# Patient Record
Sex: Female | Born: 1945 | Race: White | Hispanic: No | Marital: Married | Smoking: Never smoker
Health system: Southern US, Community
[De-identification: ages and names within clinical notes are randomized; demographics above are authoritative.]

## PROBLEM LIST (undated history)

## (undated) DIAGNOSIS — K219 Gastro-esophageal reflux disease without esophagitis: Secondary | ICD-10-CM

## (undated) DIAGNOSIS — K760 Fatty (change of) liver, not elsewhere classified: Secondary | ICD-10-CM

## (undated) DIAGNOSIS — G43909 Migraine, unspecified, not intractable, without status migrainosus: Secondary | ICD-10-CM

## (undated) DIAGNOSIS — E785 Hyperlipidemia, unspecified: Secondary | ICD-10-CM

## (undated) DIAGNOSIS — I10 Essential (primary) hypertension: Secondary | ICD-10-CM

## (undated) DIAGNOSIS — G4733 Obstructive sleep apnea (adult) (pediatric): Secondary | ICD-10-CM

## (undated) DIAGNOSIS — I509 Heart failure, unspecified: Secondary | ICD-10-CM

## (undated) DIAGNOSIS — F329 Major depressive disorder, single episode, unspecified: Secondary | ICD-10-CM

## (undated) DIAGNOSIS — G8929 Other chronic pain: Secondary | ICD-10-CM

## (undated) DIAGNOSIS — F32A Depression, unspecified: Secondary | ICD-10-CM

## (undated) DIAGNOSIS — E119 Type 2 diabetes mellitus without complications: Secondary | ICD-10-CM

## (undated) DIAGNOSIS — C9210 Chronic myeloid leukemia, BCR/ABL-positive, not having achieved remission: Secondary | ICD-10-CM

## (undated) DIAGNOSIS — L219 Seborrheic dermatitis, unspecified: Secondary | ICD-10-CM

## (undated) DIAGNOSIS — M109 Gout, unspecified: Secondary | ICD-10-CM

## (undated) DIAGNOSIS — G2581 Restless legs syndrome: Secondary | ICD-10-CM

## (undated) DIAGNOSIS — G629 Polyneuropathy, unspecified: Secondary | ICD-10-CM

## (undated) DIAGNOSIS — M549 Dorsalgia, unspecified: Secondary | ICD-10-CM

## (undated) DIAGNOSIS — L719 Rosacea, unspecified: Secondary | ICD-10-CM

## (undated) DIAGNOSIS — L309 Dermatitis, unspecified: Secondary | ICD-10-CM

## (undated) DIAGNOSIS — M1990 Unspecified osteoarthritis, unspecified site: Secondary | ICD-10-CM

## (undated) HISTORY — PX: ABDOMINAL HYSTERECTOMY: SHX81

## (undated) HISTORY — DX: Heart failure, unspecified: I50.9

## (undated) HISTORY — PX: JOINT REPLACEMENT: SHX530

## (undated) HISTORY — PX: BACK SURGERY: SHX140

---

## 2013-11-24 DIAGNOSIS — C9210 Chronic myeloid leukemia, BCR/ABL-positive, not having achieved remission: Secondary | ICD-10-CM

## 2013-11-24 HISTORY — DX: Chronic myeloid leukemia, BCR/ABL-positive, not having achieved remission: C92.10

## 2017-11-15 ENCOUNTER — Inpatient Hospital Stay (HOSPITAL_BASED_OUTPATIENT_CLINIC_OR_DEPARTMENT_OTHER)
Admission: EM | Admit: 2017-11-15 | Discharge: 2017-11-21 | DRG: 871 | Disposition: A | Discharge status: Home / Self Care | Payer: Medicare HMO | Attending: Pulmonary Disease | Admitting: Pulmonary Disease

## 2017-11-15 ENCOUNTER — Encounter (HOSPITAL_BASED_OUTPATIENT_CLINIC_OR_DEPARTMENT_OTHER): Payer: Self-pay | Admitting: Emergency Medicine

## 2017-11-15 ENCOUNTER — Emergency Department (HOSPITAL_BASED_OUTPATIENT_CLINIC_OR_DEPARTMENT_OTHER): Payer: Medicare HMO

## 2017-11-15 ENCOUNTER — Other Ambulatory Visit: Payer: Self-pay

## 2017-11-15 DIAGNOSIS — G8929 Other chronic pain: Secondary | ICD-10-CM | POA: Diagnosis present

## 2017-11-15 DIAGNOSIS — A419 Sepsis, unspecified organism: Secondary | ICD-10-CM | POA: Diagnosis present

## 2017-11-15 DIAGNOSIS — J189 Pneumonia, unspecified organism: Secondary | ICD-10-CM | POA: Diagnosis present

## 2017-11-15 DIAGNOSIS — R0902 Hypoxemia: Secondary | ICD-10-CM | POA: Diagnosis not present

## 2017-11-15 DIAGNOSIS — E1142 Type 2 diabetes mellitus with diabetic polyneuropathy: Secondary | ICD-10-CM | POA: Diagnosis present

## 2017-11-15 DIAGNOSIS — C9211 Chronic myeloid leukemia, BCR/ABL-positive, in remission: Secondary | ICD-10-CM | POA: Diagnosis present

## 2017-11-15 DIAGNOSIS — Z885 Allergy status to narcotic agent status: Secondary | ICD-10-CM | POA: Diagnosis not present

## 2017-11-15 DIAGNOSIS — Z91040 Latex allergy status: Secondary | ICD-10-CM

## 2017-11-15 DIAGNOSIS — K219 Gastro-esophageal reflux disease without esophagitis: Secondary | ICD-10-CM | POA: Diagnosis present

## 2017-11-15 DIAGNOSIS — T380X5A Adverse effect of glucocorticoids and synthetic analogues, initial encounter: Secondary | ICD-10-CM | POA: Diagnosis not present

## 2017-11-15 DIAGNOSIS — G2581 Restless legs syndrome: Secondary | ICD-10-CM | POA: Diagnosis present

## 2017-11-15 DIAGNOSIS — C9210 Chronic myeloid leukemia, BCR/ABL-positive, not having achieved remission: Secondary | ICD-10-CM

## 2017-11-15 DIAGNOSIS — E876 Hypokalemia: Secondary | ICD-10-CM | POA: Diagnosis not present

## 2017-11-15 DIAGNOSIS — J1289 Other viral pneumonia: Secondary | ICD-10-CM | POA: Diagnosis not present

## 2017-11-15 DIAGNOSIS — I10 Essential (primary) hypertension: Secondary | ICD-10-CM | POA: Diagnosis not present

## 2017-11-15 DIAGNOSIS — E785 Hyperlipidemia, unspecified: Secondary | ICD-10-CM | POA: Diagnosis present

## 2017-11-15 DIAGNOSIS — M549 Dorsalgia, unspecified: Secondary | ICD-10-CM | POA: Diagnosis present

## 2017-11-15 DIAGNOSIS — E872 Acidosis: Secondary | ICD-10-CM | POA: Diagnosis present

## 2017-11-15 DIAGNOSIS — K769 Liver disease, unspecified: Secondary | ICD-10-CM | POA: Diagnosis present

## 2017-11-15 DIAGNOSIS — Z888 Allergy status to other drugs, medicaments and biological substances status: Secondary | ICD-10-CM | POA: Diagnosis not present

## 2017-11-15 DIAGNOSIS — J129 Viral pneumonia, unspecified: Secondary | ICD-10-CM | POA: Diagnosis not present

## 2017-11-15 DIAGNOSIS — G4733 Obstructive sleep apnea (adult) (pediatric): Secondary | ICD-10-CM | POA: Diagnosis present

## 2017-11-15 DIAGNOSIS — F419 Anxiety disorder, unspecified: Secondary | ICD-10-CM | POA: Diagnosis present

## 2017-11-15 DIAGNOSIS — Z886 Allergy status to analgesic agent status: Secondary | ICD-10-CM

## 2017-11-15 DIAGNOSIS — J9601 Acute respiratory failure with hypoxia: Secondary | ICD-10-CM | POA: Diagnosis not present

## 2017-11-15 DIAGNOSIS — J9690 Respiratory failure, unspecified, unspecified whether with hypoxia or hypercapnia: Secondary | ICD-10-CM

## 2017-11-15 DIAGNOSIS — Z79899 Other long term (current) drug therapy: Secondary | ICD-10-CM

## 2017-11-15 DIAGNOSIS — Y95 Nosocomial condition: Secondary | ICD-10-CM | POA: Diagnosis present

## 2017-11-15 DIAGNOSIS — Z7984 Long term (current) use of oral hypoglycemic drugs: Secondary | ICD-10-CM | POA: Diagnosis not present

## 2017-11-15 DIAGNOSIS — E1165 Type 2 diabetes mellitus with hyperglycemia: Secondary | ICD-10-CM | POA: Diagnosis present

## 2017-11-15 DIAGNOSIS — F329 Major depressive disorder, single episode, unspecified: Secondary | ICD-10-CM | POA: Diagnosis present

## 2017-11-15 HISTORY — DX: Polyneuropathy, unspecified: G62.9

## 2017-11-15 HISTORY — DX: Gout, unspecified: M10.9

## 2017-11-15 HISTORY — DX: Essential (primary) hypertension: I10

## 2017-11-15 HISTORY — DX: Major depressive disorder, single episode, unspecified: F32.9

## 2017-11-15 HISTORY — DX: Dermatitis, unspecified: L30.9

## 2017-11-15 HISTORY — DX: Restless legs syndrome: G25.81

## 2017-11-15 HISTORY — DX: Dorsalgia, unspecified: M54.9

## 2017-11-15 HISTORY — DX: Type 2 diabetes mellitus without complications: E11.9

## 2017-11-15 HISTORY — DX: Chronic myeloid leukemia, BCR/ABL-positive, not having achieved remission: C92.10

## 2017-11-15 HISTORY — DX: Hyperlipidemia, unspecified: E78.5

## 2017-11-15 HISTORY — DX: Fatty (change of) liver, not elsewhere classified: K76.0

## 2017-11-15 HISTORY — DX: Rosacea, unspecified: L71.9

## 2017-11-15 HISTORY — DX: Other chronic pain: G89.29

## 2017-11-15 HISTORY — DX: Obstructive sleep apnea (adult) (pediatric): G47.33

## 2017-11-15 HISTORY — DX: Depression, unspecified: F32.A

## 2017-11-15 HISTORY — DX: Migraine, unspecified, not intractable, without status migrainosus: G43.909

## 2017-11-15 HISTORY — DX: Seborrheic dermatitis, unspecified: L21.9

## 2017-11-15 HISTORY — DX: Gastro-esophageal reflux disease without esophagitis: K21.9

## 2017-11-15 LAB — PROTIME-INR
INR: 1.13
Prothrombin Time: 14.4 seconds (ref 11.4–15.2)

## 2017-11-15 LAB — COMPREHENSIVE METABOLIC PANEL
ALBUMIN: 3.5 g/dL (ref 3.5–5.0)
ALK PHOS: 115 U/L (ref 38–126)
ALT: 20 U/L (ref 14–54)
ANION GAP: 11 (ref 5–15)
AST: 36 U/L (ref 15–41)
BUN: 12 mg/dL (ref 6–20)
CALCIUM: 8.8 mg/dL — AB (ref 8.9–10.3)
CHLORIDE: 102 mmol/L (ref 101–111)
CO2: 24 mmol/L (ref 22–32)
Creatinine, Ser: 0.9 mg/dL (ref 0.44–1.00)
GFR calc non Af Amer: >60 mL/min (ref >60)
GLUCOSE: 128 mg/dL — AB (ref 65–99)
Potassium: 3.4 mmol/L — ABNORMAL LOW (ref 3.5–5.1)
SODIUM: 137 mmol/L (ref 135–145)
Total Bilirubin: 1.7 mg/dL — ABNORMAL HIGH (ref 0.3–1.2)
Total Protein: 7.6 g/dL (ref 6.5–8.1)

## 2017-11-15 LAB — CBC WITH DIFFERENTIAL/PLATELET
Basophils Absolute: 0.1 10*3/uL (ref 0.0–0.1)
Basophils Relative: 0 %
Eosinophils Absolute: 0.1 10*3/uL (ref 0.0–0.7)
Eosinophils Relative: 0 %
HEMATOCRIT: 36.5 % (ref 36.0–46.0)
HEMOGLOBIN: 11.8 g/dL — AB (ref 12.0–15.0)
LYMPHS ABS: 1.3 10*3/uL (ref 0.7–4.0)
LYMPHS PCT: 7 %
MCH: 27.6 pg (ref 26.0–34.0)
MCHC: 32.3 g/dL (ref 30.0–36.0)
MCV: 85.3 fL (ref 78.0–100.0)
MONOS PCT: 8 %
Monocytes Absolute: 1.6 10*3/uL — ABNORMAL HIGH (ref 0.1–1.0)
NEUTROS PCT: 85 %
Neutro Abs: 15.8 10*3/uL (ref 1.7–7.7)
Platelets: 332 10*3/uL (ref 150–400)
RBC: 4.28 MIL/uL (ref 3.87–5.11)
RDW: 15.7 % — ABNORMAL HIGH (ref 11.5–15.5)
WBC: 18.8 10*3/uL — AB (ref 4.0–10.5)

## 2017-11-15 LAB — I-STAT CG4 LACTIC ACID, ED: Lactic Acid, Venous: 1.63 mmol/L (ref 0.5–1.9)

## 2017-11-15 LAB — GLUCOSE, CAPILLARY: Glucose-Capillary: 110 mg/dL — ABNORMAL HIGH (ref 65–99)

## 2017-11-15 MED ORDER — ACETAMINOPHEN 500 MG PO TABS: 1000.0000 mg | ORAL_TABLET | Freq: Once | ORAL | Status: DC

## 2017-11-15 MED ORDER — DULOXETINE HCL 60 MG PO CPEP
60.0000 mg | ORAL_CAPSULE | Freq: Every day | ORAL | Status: DC
  Administered 2017-11-18 – 2017-11-21 (×4): 60 mg via ORAL
  Filled 2017-11-15: qty 1
  Filled 2017-11-15 (×3): qty 2

## 2017-11-15 MED ORDER — CEFEPIME HCL 2 G IJ SOLR
INTRAMUSCULAR | Status: AC
  Filled 2017-11-15: qty 2

## 2017-11-15 MED ORDER — IPRATROPIUM-ALBUTEROL 0.5-2.5 (3) MG/3ML IN SOLN
3.0000 mL | Freq: Once | RESPIRATORY_TRACT | Status: AC
  Administered 2017-11-15: 3 mL via RESPIRATORY_TRACT
  Filled 2017-11-15: qty 3

## 2017-11-15 MED ORDER — MECLIZINE HCL 25 MG PO TABS
12.5000 mg | ORAL_TABLET | Freq: Three times a day (TID) | ORAL | Status: DC | PRN
  Filled 2017-11-15: qty 1

## 2017-11-15 MED ORDER — VANCOMYCIN HCL IN DEXTROSE 750-5 MG/150ML-% IV SOLN
750.0000 mg | Freq: Two times a day (BID) | INTRAVENOUS | Status: DC
  Administered 2017-11-16 – 2017-11-18 (×5): 750 mg via INTRAVENOUS
  Filled 2017-11-15 (×6): qty 150

## 2017-11-15 MED ORDER — ROPINIROLE HCL 1 MG PO TABS
4.0000 mg | ORAL_TABLET | Freq: Every day | ORAL | Status: DC
  Administered 2017-11-15 – 2017-11-20 (×5): 4 mg via ORAL
  Filled 2017-11-15 (×6): qty 4

## 2017-11-15 MED ORDER — GUAIFENESIN ER 600 MG PO TB12
600.0000 mg | ORAL_TABLET | Freq: Two times a day (BID) | ORAL | Status: DC
  Administered 2017-11-15 – 2017-11-21 (×9): 600 mg via ORAL
  Filled 2017-11-15 (×9): qty 1

## 2017-11-15 MED ORDER — ENOXAPARIN SODIUM 40 MG/0.4ML ~~LOC~~ SOLN
40.0000 mg | Freq: Every day | SUBCUTANEOUS | Status: DC
  Administered 2017-11-15 – 2017-11-20 (×6): 40 mg via SUBCUTANEOUS
  Filled 2017-11-15 (×6): qty 0.4

## 2017-11-15 MED ORDER — POTASSIUM CHLORIDE CRYS ER 20 MEQ PO TBCR
20.0000 meq | EXTENDED_RELEASE_TABLET | Freq: Two times a day (BID) | ORAL | Status: DC
  Administered 2017-11-15 – 2017-11-16 (×3): 20 meq via ORAL
  Filled 2017-11-15 (×3): qty 1

## 2017-11-15 MED ORDER — ALLOPURINOL 300 MG PO TABS
300.0000 mg | ORAL_TABLET | Freq: Every day | ORAL | Status: DC
  Administered 2017-11-18 – 2017-11-21 (×4): 300 mg via ORAL
  Filled 2017-11-15 (×2): qty 3
  Filled 2017-11-15: qty 1
  Filled 2017-11-15: qty 3

## 2017-11-15 MED ORDER — ALBUTEROL SULFATE (2.5 MG/3ML) 0.083% IN NEBU
2.5000 mg | INHALATION_SOLUTION | RESPIRATORY_TRACT | Status: DC | PRN
Start: 2017-11-15 — End: 2017-11-16
  Administered 2017-11-15: 2.5 mg via RESPIRATORY_TRACT
  Filled 2017-11-15: qty 3

## 2017-11-15 MED ORDER — FUROSEMIDE 20 MG PO TABS: 20.0000 mg | ORAL_TABLET | Freq: Every day | ORAL | Status: DC

## 2017-11-15 MED ORDER — LORAZEPAM 1 MG PO TABS
1.0000 mg | ORAL_TABLET | Freq: Two times a day (BID) | ORAL | Status: DC | PRN
  Administered 2017-11-16 – 2017-11-19 (×2): 1 mg via ORAL
  Filled 2017-11-15 (×3): qty 1

## 2017-11-15 MED ORDER — INSULIN ASPART 100 UNIT/ML ~~LOC~~ SOLN
0.0000 [IU] | Freq: Three times a day (TID) | SUBCUTANEOUS | Status: DC
  Administered 2017-11-16 – 2017-11-19 (×3): 2 [IU] via SUBCUTANEOUS
  Administered 2017-11-19: 3 [IU] via SUBCUTANEOUS
  Administered 2017-11-19 – 2017-11-20 (×2): 2 [IU] via SUBCUTANEOUS

## 2017-11-15 MED ORDER — PANTOPRAZOLE SODIUM 40 MG PO TBEC
40.0000 mg | DELAYED_RELEASE_TABLET | Freq: Every day | ORAL | Status: DC
Start: 2017-11-16 — End: 2017-11-21
  Administered 2017-11-18 – 2017-11-20 (×3): 40 mg via ORAL
  Filled 2017-11-15 (×3): qty 1

## 2017-11-15 MED ORDER — ACETAMINOPHEN 650 MG RE SUPP
RECTAL | Status: AC
  Filled 2017-11-15: qty 1

## 2017-11-15 MED ORDER — ACETAMINOPHEN 325 MG PO TABS
650.0000 mg | ORAL_TABLET | Freq: Four times a day (QID) | ORAL | Status: DC | PRN
  Administered 2017-11-15 – 2017-11-16 (×2): 650 mg via ORAL
  Filled 2017-11-15 (×2): qty 2

## 2017-11-15 MED ORDER — CEFEPIME HCL 2 G IJ SOLR
2.0000 g | Freq: Once | INTRAMUSCULAR | Status: AC
  Administered 2017-11-15: 2 g via INTRAVENOUS

## 2017-11-15 MED ORDER — SODIUM CHLORIDE 0.9 % IV SOLN
INTRAVENOUS | Status: AC
  Administered 2017-11-15: 23:00:00 via INTRAVENOUS

## 2017-11-15 MED ORDER — IPRATROPIUM-ALBUTEROL 0.5-2.5 (3) MG/3ML IN SOLN
3.0000 mL | RESPIRATORY_TRACT | Status: DC
  Administered 2017-11-16 (×2): 3 mL via RESPIRATORY_TRACT
  Filled 2017-11-15 (×2): qty 3

## 2017-11-15 MED ORDER — SODIUM CHLORIDE 0.9 % IV BOLUS (SEPSIS)
1000.0000 mL | Freq: Once | INTRAVENOUS | Status: AC
  Administered 2017-11-15: 1000 mL via INTRAVENOUS

## 2017-11-15 MED ORDER — VANCOMYCIN HCL IN DEXTROSE 1-5 GM/200ML-% IV SOLN
1000.0000 mg | Freq: Once | INTRAVENOUS | Status: AC
  Administered 2017-11-15: 1000 mg via INTRAVENOUS
  Filled 2017-11-15: qty 200

## 2017-11-15 MED ORDER — SPIRONOLACTONE 25 MG PO TABS
25.0000 mg | ORAL_TABLET | Freq: Every day | ORAL | Status: DC
  Administered 2017-11-18 – 2017-11-21 (×4): 25 mg via ORAL
  Filled 2017-11-15 (×4): qty 1

## 2017-11-15 MED ORDER — DEXTROSE 5 % IV SOLN
1.0000 g | Freq: Three times a day (TID) | INTRAVENOUS | Status: DC
  Administered 2017-11-16 – 2017-11-21 (×16): 1 g via INTRAVENOUS
  Filled 2017-11-15 (×20): qty 1

## 2017-11-15 MED ORDER — FLUTICASONE PROPIONATE 50 MCG/ACT NA SUSP
1.0000 | Freq: Every day | NASAL | Status: DC
  Administered 2017-11-19 – 2017-11-21 (×3): 1 via NASAL
  Filled 2017-11-15: qty 16

## 2017-11-15 MED ORDER — IPRATROPIUM-ALBUTEROL 0.5-2.5 (3) MG/3ML IN SOLN: 3.0000 mL | Freq: Four times a day (QID) | RESPIRATORY_TRACT | Status: DC

## 2017-11-15 MED ORDER — PRAVASTATIN SODIUM 20 MG PO TABS
10.0000 mg | ORAL_TABLET | Freq: Every day | ORAL | Status: DC
  Administered 2017-11-18 – 2017-11-21 (×4): 10 mg via ORAL
  Filled 2017-11-15 (×4): qty 1

## 2017-11-15 MED ORDER — NILOTINIB HCL 200 MG PO CAPS: 200.0000 mg | ORAL_CAPSULE | Freq: Two times a day (BID) | ORAL | Status: DC

## 2017-11-15 MED ORDER — AMLODIPINE BESYLATE 5 MG PO TABS
5.0000 mg | ORAL_TABLET | Freq: Two times a day (BID) | ORAL | Status: DC
  Administered 2017-11-15 – 2017-11-21 (×9): 5 mg via ORAL
  Filled 2017-11-15 (×9): qty 1

## 2017-11-15 MED ORDER — ACETAMINOPHEN 650 MG RE SUPP
650.0000 mg | Freq: Once | RECTAL | Status: AC
  Administered 2017-11-15: 650 mg via RECTAL

## 2017-11-15 NOTE — ED Notes (Signed)
Pt SpO2 88% on RA, Pt placed on 2L [place]

## 2017-11-15 NOTE — ED Notes (Signed)
[facility] arrived  

## 2017-11-15 NOTE — ED Triage Notes (Signed)
Patient states that she was at the urgent care for a cough and was dx with Pneumonia. The patient reports that she was sent to the ER for treatment

## 2017-11-15 NOTE — Progress Notes (Signed)
MEDICATION RELATED CONSULT NOTE - INITIAL   Pharmacy Consult for Nilotinib Indication: Oral chemo for CML  Allergies  Allergen Reactions  . Asa [Aspirin]     Ulcer   . Latex   . Morphine And Related     SOB   . Tylenol [Acetaminophen]     Ulcer     Patient Measurements: Height: 5' (152.4 cm) Weight: 203 lb (92.1 kg) IBW/kg (Calculated) : 45.5 Adjusted Body Weight:   Vital Signs: Temp: 102.3 F (39.1 C) (12/23 2100) Temp Source: Axillary (12/23 2100) BP: 129/63 (12/23 2100) Pulse Rate: 102 (12/23 2100) Intake/Output from previous day: No intake/output data recorded. Intake/Output from this shift: Total I/O In: 200 [IV Piggyback:200] Out: -   Labs: Recent Labs    11/15/17 1750  WBC 18.8*  HGB 11.8*  HCT 36.5  PLT 332  CREATININE 0.90  ALBUMIN 3.5  PROT 7.6  AST 36  ALT 20  ALKPHOS 115  BILITOT 1.7*   Estimated Creatinine Clearance: 59.7 mL/min (by C-G formula based on SCr of 0.9 mg/dL).   Microbiology: No results found for this or any previous visit (from the past 720 hour(s)).  Medical History: Past Medical History:  Diagnosis Date  . 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 Liver disease     Medications:  Medications Prior to Admission  Medication Sig Dispense Refill Last Dose  . allopurinol (ZYLOPRIM) 300 MG tablet Take 300 mg by mouth daily.   11/14/2017 at Unknown time  . amLODipine (NORVASC) 5 MG tablet Take 5 mg by mouth 2 (two) times daily.   11/14/2017 at Unknown time  . azithromycin (ZITHROMAX) 250 MG tablet Take 250-500 mg by mouth daily. 500 mg on day one, 250 mg daily days 2-5   11/14/2017 at Unknown time  . diclofenac sodium (VOLTAREN) 1 % GEL Apply 1 application topically daily as needed for pain. Right great toe   Past Week at Unknown time  . DULoxetine (CYMBALTA) 60 MG capsule Take 60 mg by mouth daily.   11/14/2017 at Unknown time  . fluticasone (FLONASE) 50 MCG/ACT nasal spray Place 1 spray into both nostrils daily.    11/15/2017 at Unknown time  . furosemide (LASIX) 20 MG tablet Take 20 mg by mouth.   11/14/2017 at Unknown time  . glipiZIDE (GLUCOTROL) 5 MG tablet Take 5 mg by mouth daily before breakfast.   11/14/2017 at Unknown time  . iron polysaccharides (NIFEREX) 150 MG capsule Take 150 mg by mouth daily.   11/14/2017 at Unknown time  . LORazepam (ATIVAN) 0.5 MG tablet Take 1 mg by mouth 3 times/day as needed-between meals & bedtime for sleep.    11/14/2017 at Unknown time  . meclizine (ANTIVERT) 12.5 MG tablet Take 12.5 mg by mouth 3 (three) times daily as needed for dizziness.   Past Month at Unknown time  . Multiple Vitamins-Minerals (MULTIVITAMIN WITH MINERALS) tablet Take 1 tablet by mouth daily.   11/14/2017 at Unknown time  . nilotinib (TASIGNA) 200 MG capsule Take 200 mg by mouth every 12 (twelve) hours. Give on an empty stomach 1 hour before or 2 hours after meals.   11/14/2017 at Unknown time  . omeprazole (PRILOSEC) 40 MG capsule Take 40 mg by mouth daily.   11/14/2017 at Unknown time  . potassium chloride (KLOR-CON) 20 MEQ packet Take by mouth 2 (two) times daily.   11/14/2017 at Unknown time  . pravastatin (PRAVACHOL) 10 MG tablet Take 10 mg by mouth daily.   11/14/2017 at  Unknown time  . pregabalin (LYRICA) 50 MG capsule Take 100 mg by mouth at bedtime.    11/14/2017 at Unknown time  . rOPINIRole (REQUIP) 1 MG tablet Take 4 mg by mouth at bedtime.    11/14/2017 at Unknown time  . spironolactone (ALDACTONE) 25 MG tablet Take 25 mg by mouth daily.   11/14/2017 at Unknown time  . tiZANidine (ZANAFLEX) 4 MG capsule Take 4 mg by mouth at bedtime.    11/14/2017 at Unknown time  . traMADol (ULTRAM) 50 MG tablet Take by mouth every 6 (six) hours as needed.   Past Week at Unknown time    Assessment: Patient with Nilotinib for CML.  Patient also with active infection  Nilotinib ([place]) hold criteria  Persistent hypokalemia or hypomagnesemia  Unexplained pancreatitis  QTc > 500 msec (0.5 sec)  [need recent EKG]  Active infection   Goal of Therapy:  Safe and effective use of Nilotinib  Plan:  D/c Nilotinib at this time  Nani Skillern Crowford 11/15/2017,11:37 PM

## 2017-11-15 NOTE — H&P (Signed)
[facility] History and Physical  Wanda Hanson MGQ:[id number] DOB: 12-31-1945 DOA: 11/15/2017  Referring physician:  PCP: System, Pcp Not In   Chief Complaint: "Been sick since last Thursday."  HPI: Wanda Hanson is a 71 y.o. female with past medical history significant for CML and liver disease presents to the hospital with cough and fever.  Patient has been feeling ill since last Thursday.  2 other family members have bronchitis.  Patient was given azithromycin, completed.  She did not improve.  Went to urgent care today for evaluation.  Diagnosed with pneumonia.  Sent to the emergency room for evaluation.  Course: Patient given Tylenol in the ED for fever.  Patient tolerated this medication.  Chest x-ray showed multifocal pneumonia.  Patient started on IV antibiotics.  Cultures drawn.  Hospitalist consulted for admission.  Review of Systems:  As per HPI otherwise 10 point review of systems negative.    Past Medical History:  Diagnosis Date  . 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 Liver disease    Past Surgical History:  Procedure Laterality Date  . ABDOMINAL HYSTERECTOMY    . BACK SURGERY    . JOINT REPLACEMENT     Social History:  reports that  has never smoked. she has never used smokeless tobacco. She reports that she does not drink alcohol or use drugs.  Allergies  Allergen Reactions  . Asa [Aspirin]     Ulcer   . Latex   . Morphine And Related     SOB   . Tylenol [Acetaminophen]     Ulcer     History reviewed. No pertinent family history.   Prior to Admission medications   Medication Sig Start Date End Date Taking? Authorizing Provider  allopurinol (ZYLOPRIM) 300 MG tablet Take 300 mg by mouth daily.   Yes [provider]  amLODipine (NORVASC) 5 MG tablet Take 5 mg by mouth 2 (two) times daily.   Yes [provider]  azithromycin (ZITHROMAX) 250 MG tablet Take 250-500 mg by mouth daily. 500 mg on day one, 250 mg daily days  2-5 11/12/17  Yes [provider]  diclofenac sodium (VOLTAREN) 1 % GEL Apply 1 application topically daily as needed for pain. Right great toe 04/08/17  Yes [provider]  DULoxetine (CYMBALTA) 60 MG capsule Take 60 mg by mouth daily.   Yes [provider]  fluticasone (FLONASE) 50 MCG/ACT nasal spray Place 1 spray into both nostrils daily. 07/06/17  Yes [provider]  furosemide (LASIX) 20 MG tablet Take 20 mg by mouth.   Yes [provider]  glipiZIDE (GLUCOTROL) 5 MG tablet Take 5 mg by mouth daily before breakfast.   Yes [provider]  iron polysaccharides (NIFEREX) 150 MG capsule Take 150 mg by mouth daily.   Yes [provider]  LORazepam (ATIVAN) 0.5 MG tablet Take 1 mg by mouth 3 times/day as needed-between meals & bedtime for sleep.    Yes [provider]  meclizine (ANTIVERT) 12.5 MG tablet Take 12.5 mg by mouth 3 (three) times daily as needed for dizziness.   Yes [provider]  Multiple Vitamins-Minerals (MULTIVITAMIN WITH MINERALS) tablet Take 1 tablet by mouth daily. 04/05/16  Yes [provider]  nilotinib (TASIGNA) 200 MG capsule Take 200 mg by mouth every 12 (twelve) hours. Give on an empty stomach 1 hour before or 2 hours after meals.   Yes [provider]  omeprazole (PRILOSEC) 40 MG capsule Take 40 mg by mouth  daily.   Yes [provider]  potassium chloride (KLOR-CON) 20 MEQ packet Take by mouth 2 (two) times daily.   Yes [provider]  pravastatin (PRAVACHOL) 10 MG tablet Take 10 mg by mouth daily.   Yes [provider]  pregabalin (LYRICA) 50 MG capsule Take 100 mg by mouth at bedtime.    Yes [provider]  rOPINIRole (REQUIP) 1 MG tablet Take 4 mg by mouth at bedtime.    Yes [provider]  spironolactone (ALDACTONE) 25 MG tablet Take 25 mg by mouth daily. 10/27/17 11/26/17 Yes [provider]  tiZANidine (ZANAFLEX) 4  MG capsule Take 4 mg by mouth at bedtime.    Yes [provider]  traMADol (ULTRAM) 50 MG tablet Take by mouth every 6 (six) hours as needed.   Yes [provider]   Physical Exam: Vitals:   11/15/17 1930 11/15/17 1935 11/15/17 2006 11/15/17 2100  BP: (!) 106/59  108/61 129/63  Pulse: 97  97 (!) 102  Resp: (!) 35  (!) 35 (!) 36  Temp:    (!) 102.3 F (39.1 C)  TempSrc:    Axillary  SpO2: 94% 93% 94% 95%  Weight:      Height:        Wt Readings from Last 3 Encounters:  11/15/17 92.1 kg (203 lb)    General:  Appears calm and comfortable; A&Ox3 Eyes:  PERRL, EOMI, normal lids, iris ENT:  grossly normal hearing, lips & tongue Neck:  no LAD, masses or thyromegaly Cardiovascular:  RRR, no m/r/g. No LE edema.  Respiratory:  Decr air movement, no w/r/r. Normal respiratory effort. Venti mask. Abdomen:  soft, ntnd Skin:  no rash or induration seen on limited exam Musculoskeletal:  grossly normal tone BUE/BLE Psychiatric:  grossly normal mood and affect, speech fluent and appropriate Neurologic:  CN 2-12 grossly intact, moves all extremities in coordinated fashion.          Labs on Admission:  Basic Metabolic Panel: Recent Labs  Lab 11/15/17 1750  NA 137  K 3.4*  CL 102  CO2 24  GLUCOSE 128*  BUN 12  CREATININE 0.90  CALCIUM 8.8*   Liver Function Tests: Recent Labs  Lab 11/15/17 1750  AST 36  ALT 20  ALKPHOS 115  BILITOT 1.7*  PROT 7.6  ALBUMIN 3.5   No results for input(s): LIPASE, AMYLASE in the last 168 hours. No results for input(s): AMMONIA in the last 168 hours. CBC: Recent Labs  Lab 11/15/17 1750  WBC 18.8*  NEUTROABS 15.8  HGB 11.8*  HCT 36.5  MCV 85.3  PLT 332   Cardiac Enzymes: No results for input(s): CKTOTAL, CKMB, CKMBINDEX, TROPONINI in the last 168 hours.  BNP (last 3 results) No results for input(s): BNP in the last 8760 hours.  ProBNP (last 3 results) No results for input(s): PROBNP in the last 8760  hours.   Serum creatinine: 0.9 mg/dL 11/15/17 1750 Estimated creatinine clearance: 59.7 mL/min  CBG: No results for input(s): GLUCAP in the last 168 hours.  Radiological Exams on Admission: Dg Chest 2 View  Result Date: 11/15/2017 CLINICAL DATA:  Cough, congestion, fever EXAM: CHEST  2 VIEW COMPARISON:  None. FINDINGS: Airspace opacity noted in the right upper lobe and perihilar right lower lobe, likely pneumonia. Mild cardiomegaly. No confluent opacity on the left. No effusions or acute bony abnormality. IMPRESSION: Airspace opacities in the right upper lobe and perihilar right lower lobe concerning for pneumonia. Followup PA and  lateral chest X-ray is recommended in 3-4 weeks following trial of antibiotic therapy to ensure resolution and exclude underlying malignancy. Electronically Signed   By: Rolm Baptise M.D.   On: 11/15/2017 17:49    EKG: Independently reviewed. Sinus tach. No STEMI.  Assessment/Plan Active Problems:   Sepsis due to pneumonia (HCC)   Sepsis 2/2 pna Patient hemodynamically stable Given vanc emergency room, will continue Given cefepime in the emergency room, will continue Urine culture pending Blood cultures 2 pending Patient given 1000 mL of fluid in the emergency room Lactic acid normal Scheduled DuoNeb's When necessary albuterol Oxygen therapy Continuous pulse oximetry Sputum ordered  CML Cont allopurinol, tasigna  Cont lasix  Hypertension When necessary hydralazine 10 mg IV as needed for severe blood pressure Cont norvasc, aldactone  Allergies Cont flonase  DM  SSI AC  Vertigo PRn meclzine  GERD Cont PPI  Chronic pain Hold tramadol, lyrica and zanaflex  Hyperlipidemia Continue statin  Depression/Anxiety No SI/HI Cont cymbalta Ativan prn  RLS Cont reqip  Code Status: FC DVT Prophylaxis: lovenox Family Communication: husband at bedside Disposition Plan: Pending Improvement  Status: inpt tele  Elwin Mocha,  MD Family Medicine [facility] [email or web address] Password [email or web address]

## 2017-11-15 NOTE — ED Notes (Signed)
Pt placed on 50% venti mask

## 2017-11-15 NOTE — Plan of Care (Signed)
[facility] Note  Sent over from urgent care. Cough 2 weeks with fever. Multifocal pna seen on cxr. Pt has CML. On oral chemo. Recently admitted to [facility] for Hypokalemia in last month. Pt hypoxic to 88% on RA. Doing well on [place]. WBC 18k.   EDP started sepsis protocol. Started on Vanc Cefepime.  Requested: breathing treatment  Pt followed by [facility] for [facility]  Reason for admit: Sepsis and pt prefers [facility]  Status: Inpt Tele  Elwin Mocha MD

## 2017-11-15 NOTE — ED Notes (Signed)
Vancomycin order verified with [facility].

## 2017-11-15 NOTE — ED Notes (Signed)
Pts 02 increased to 3lt due to SATS of 89-90. Pt SATS increased to 93-94%. Clear diminished breath sounds bilateral.

## 2017-11-15 NOTE — ED Notes (Signed)
Patient transported to X-ray 

## 2017-11-15 NOTE — Progress Notes (Signed)
Patient arrived to room 1405 from [facility] via [place]. Patient is alert and oriented x4. Patient has Venti mask at 50% with sats at 94%. Patient resp 36. Patient has IV site to Left and Right AC, both sites SL. Patient has generalized edema to lower ext. Skin intact. Small scab to right ankle. Patient had fall six months ago so high fall risk. Will page provider on call. Oriented to room, call bed, bed use, nurse bell and to call for assist. Bed alarm placed. Will c/t monitor

## 2017-11-15 NOTE — Progress Notes (Signed)
Pharmacy Antibiotic Note  Wanda Hanson is a 71 y.o. female admitted on 11/15/2017 with pneumonia.  Pharmacy has been consulted for vancomycin and cefepime dosing. Tmax is 103.7 and lactic acid is <2. Scr is WNL and WBC is pending.   Plan: Vancomycin 2gm IV x 1 then 750mg  IV Q12H Cefepime 2gm IV x 1 then 1gm IV Q8H F/u renal fxn, C&S, clinical status and trough at SS  Height: 5' (152.4 cm) Weight: 203 lb (92.1 kg) IBW/kg (Calculated) : 45.5  Temp (24hrs), Avg:103.4 F (39.7 C), Min:103.1 F (39.5 C), Max:103.7 F (39.8 C)  No results for input(s): WBC, CREATININE, LATICACIDVEN, VANCOTROUGH, VANCOPEAK, VANCORANDOM, GENTTROUGH, GENTPEAK, GENTRANDOM, TOBRATROUGH, TOBRAPEAK, TOBRARND, AMIKACINPEAK, AMIKACINTROU, AMIKACIN in the last 168 hours.  CrCl cannot be calculated (No order found.).    Antimicrobials this admission: Vanc 12/23>> Cefepime 12/23>>  Dose adjustments this admission: N/A  Microbiology results: Pending  Thank you for allowing pharmacy to be a part of this patient's care.  Otho Michalik, Rande Lawman 11/15/2017 6:10 PM

## 2017-11-15 NOTE — Progress Notes (Signed)
Pt states that her husband is going to bring in her home CPAP unit.  Pt has declined use of hospital provided unit.  RT to monitor and assess as needed.

## 2017-11-15 NOTE — ED Provider Notes (Signed)
[facility] EMERGENCY DEPARTMENT Provider Note   CSN: [id number] Arrival date & time: 11/15/17  1710     History   Chief Complaint Chief Complaint  Patient presents with  . Cough    HPI Wanda Hanson is a 71 y.o. female.  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  Patient states that for the past week she has had a productive cough with shortness of breath.  However this acutely worsened 1-2 days ago.  Patient was seen 1 week ago by her PCP and treated with erythromycin.  Patient also endorses chills, fevers, night sweats, rhinorrhea, wheezing.  Urgent care for an x-ray that showed concern for multifocal pneumonia and sent to the ED for evaluation.  Patient reports generalized body aches.  Denies any associated chest pain, abdominal pain, nausea, emesis, urinary symptoms, change in bowel habits.  Patient was recently admitted on 12/4 2 [facility] for hypokalemia.  Patient also has CML and is currently receiving p.o. chemotherapy twice a day.  Patient does not take anything for her fever prior to arrival.  Nothing makes her symptoms better or worse.  Patient denies any history of asthma or COPD.  She denies any tobacco use.  Any known sick contacts.  Pt denies any fever, chill, ha, vision changes, lightheadedness, dizziness, congestion, neck pain, cp, abd pain, n/v/d, urinary symptoms, change in bowel habits, melena, hematochezia, lower extremity paresthesias.  Past Medical History:  Diagnosis Date  . 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 Liver disease     Patient Active Problem List   Diagnosis Date Noted  . Sepsis due to pneumonia ([place]) 11/15/2017    Past Surgical History:  Procedure Laterality Date  . ABDOMINAL HYSTERECTOMY    . BACK SURGERY    . JOINT  REPLACEMENT      OB History    No data available       Home Medications    Prior to Admission medications   Medication Sig Start Date End Date Taking? Authorizing Provider  allopurinol (ZYLOPRIM) 300 MG tablet Take 300 mg by mouth daily.   Yes [provider]  amLODipine (NORVASC) 5 MG tablet Take 5 mg by mouth 2 (two) times daily.   Yes [provider]  amLODipine-benazepril (LOTREL) 5-10 MG capsule Take 1 capsule by mouth daily.   Yes [provider]  DULoxetine (CYMBALTA) 60 MG capsule Take 60 mg by mouth daily.   Yes [provider]  furosemide (LASIX) 20 MG tablet Take 20 mg by mouth.   Yes [provider]  GLIPIZIDE PO Take 50 mg by mouth 2 (two) times daily.   Yes [provider]  iron polysaccharides (NIFEREX) 150 MG capsule Take 150 mg by mouth daily.   Yes [provider]  LORazepam (ATIVAN) 0.5 MG tablet Take 0.5 mg by mouth every 8 (eight) hours.   Yes [provider]  meclizine (ANTIVERT) 12.5 MG tablet Take 12.5 mg by mouth 3 (three) times daily as needed for dizziness.   Yes [provider]  nilotinib (TASIGNA) 200 MG capsule Take 200 mg by mouth every 12 (twelve) hours. Give on an empty stomach 1 hour before or 2 hours after meals.   Yes [provider]  omeprazole (PRILOSEC) 40 MG capsule Take 40 mg by mouth daily.   Yes  [provider]  ondansetron (ZOFRAN) 4 MG tablet Take 4 mg by mouth every 8 (eight) hours as needed for nausea or vomiting.   Yes [provider]  potassium chloride (KLOR-CON) 20 MEQ packet Take by mouth 2 (two) times daily.   Yes [provider]  pravastatin (PRAVACHOL) 10 MG tablet Take 10 mg by mouth daily.   Yes [provider]  pregabalin (LYRICA) 50 MG capsule Take 50 mg by mouth 3 (three) times daily.   Yes [provider]  rOPINIRole (REQUIP) 1 MG tablet Take 1 mg by mouth 3 (three) times daily.   Yes [provider]  tiZANidine (ZANAFLEX) 4 MG capsule Take 4 mg by mouth 3 (three) times daily.   Yes [provider]  traMADol (ULTRAM) 50 MG tablet Take by mouth every 6 (six) hours as needed.   Yes [provider]    Family History History reviewed. No pertinent family history.  Social History Social History   Tobacco Use  . Smoking status: Never Smoker  . Smokeless tobacco: Never Used  Substance Use Topics  . Alcohol use: No    Frequency: Never  . Drug use: No     Allergies   Asa [aspirin]; Latex; Morphine and related; and Tylenol [acetaminophen]   Review of Systems Review of Systems  Constitutional: Positive for chills, fatigue and fever.  HENT: Positive for congestion, rhinorrhea and sore throat.   Eyes: Negative for visual disturbance.  Respiratory: Positive for cough, shortness of breath and wheezing.   Cardiovascular: Negative for chest pain, palpitations and leg swelling.  Gastrointestinal: Negative for abdominal pain, diarrhea, nausea and vomiting.  Genitourinary: Negative for dysuria, flank pain, frequency, hematuria and urgency.  Musculoskeletal: Negative for arthralgias and myalgias.  Skin: Negative for rash.  Neurological: Negative for dizziness, syncope, weakness, light-headedness, numbness and headaches.  Psychiatric/Behavioral: Negative for sleep disturbance. The patient is not nervous/anxious.      Physical Exam Updated Vital Signs BP (!) 106/59   Pulse 97   Temp (!) 102.6 F (39.2 C) (Oral)   Resp (!) 35   Ht 5' (1.524 m)   Wt 92.1 kg (203 lb)   SpO2 93%   BMI 39.65 kg/m   Physical Exam  Constitutional: She is oriented to person, place, and time. She appears well-developed and well-nourished.  Non-toxic appearance. No distress.  HENT:  Head: Normocephalic and atraumatic.  Right Ear: Tympanic membrane, external ear and ear canal normal.  Left Ear: Tympanic membrane, external ear and ear canal normal.  Nose: Mucosal edema  and rhinorrhea present.  Mouth/Throat: Uvula is midline, oropharynx is clear and moist and mucous membranes are normal. No trismus in the jaw. No uvula swelling. No tonsillar exudate.  Eyes: Conjunctivae are normal. Pupils are equal, round, and reactive to light. Right eye exhibits no discharge. Left eye exhibits no discharge.  Neck: Normal range of motion. Neck supple.  No c spine midline tenderness. No paraspinal tenderness. No deformities or step offs noted. Full ROM. Supple. No nuchal rigidity.    Cardiovascular: Regular rhythm, normal heart sounds and intact distal pulses. Exam reveals no gallop and no friction rub.  No murmur heard. Tachycardia noted.  Pulmonary/Chest: Effort normal. No stridor. No respiratory distress. She has wheezes. She has rales. She exhibits no tenderness.  Patient is hypoxic on room air 88%.  Rhonchi noted in all lung fields.  Faint expiratory wheezes noted.  Abdominal: Soft. Bowel sounds are normal. There is no tenderness. There is no  rebound and no guarding.  Musculoskeletal: Normal range of motion. She exhibits no tenderness.  No lower extremity edema or calf tenderness.  Lymphadenopathy:    She has no cervical adenopathy.  Neurological: She is alert and oriented to person, place, and time.  Skin: Skin is warm and dry. Capillary refill takes less than 2 seconds.  Psychiatric: Her behavior is normal. Judgment and thought content normal.  Nursing note and vitals reviewed.    ED Treatments / Results  Labs (all labs ordered are listed, but only abnormal results are displayed) Labs Reviewed  COMPREHENSIVE METABOLIC PANEL - Abnormal; Notable for the following components:      Result Value   Potassium 3.4 (*)    Glucose, Bld 128 (*)    Calcium 8.8 (*)    Total Bilirubin 1.7 (*)    All other components within normal limits  CBC WITH DIFFERENTIAL/PLATELET - Abnormal; Notable for the following components:   WBC 18.8 (*)    Hemoglobin 11.8 (*)    RDW 15.7  (*)    Monocytes Absolute 1.6 (*)    All other components within normal limits  CULTURE, BLOOD (ROUTINE X 2)  CULTURE, BLOOD (ROUTINE X 2)  PROTIME-INR  URINALYSIS, ROUTINE W REFLEX MICROSCOPIC  INFLUENZA PANEL BY PCR (TYPE A & B)  I-STAT CG4 LACTIC ACID, ED  I-STAT CG4 LACTIC ACID, ED  I-STAT CG4 LACTIC ACID, ED  I-STAT CG4 LACTIC ACID, ED    EKG  EKG Interpretation  Date/Time:  Sunday November 15 2017 18:18:29 EST Ventricular Rate:  101 PR Interval:    QRS Duration: 88 QT Interval:  378 QTC Calculation: 490 R Axis:   -67 Text Interpretation:  Sinus tachycardia Probable left atrial enlargement LAD, consider left anterior fascicular block Anteroseptal infarct, age indeterminate No STEMI.  Confirmed by Nanda Quinton [phone]) on 11/15/2017 6:22:51 PM       Radiology Dg Chest 2 View  Result Date: 11/15/2017 CLINICAL DATA:  Cough, congestion, fever EXAM: CHEST  2 VIEW COMPARISON:  None. FINDINGS: Airspace opacity noted in the right upper lobe and perihilar right lower lobe, likely pneumonia. Mild cardiomegaly. No confluent opacity on the left. No effusions or acute bony abnormality. IMPRESSION: Airspace opacities in the right upper lobe and perihilar right lower lobe concerning for pneumonia. Followup PA and lateral chest X-ray is recommended in 3-4 weeks following trial of antibiotic therapy to ensure resolution and exclude underlying malignancy. Electronically Signed   By: Rolm Baptise M.D.   On: 11/15/2017 17:49    Procedures .Critical Care Performed by: Doristine Devoid, PA-C Authorized by: Doristine Devoid, PA-C   Critical care provider statement:    Critical care time (minutes):  30   Critical care was necessary to treat or prevent imminent or life-threatening deterioration of the following conditions:  Sepsis   Critical care was time spent personally by me on the following activities:  Development of treatment plan with patient or surrogate, discussions with  consultants, discussions with primary provider, examination of patient, evaluation of patient's response to treatment, ordering and performing treatments and interventions, ordering and review of laboratory studies, ordering and review of radiographic studies, pulse oximetry, re-evaluation of patient's condition and review of old charts   (including critical care time)  Medications Ordered in ED Medications  vancomycin (VANCOCIN) IVPB 1000 mg/200 mL premix (0 mg Intravenous Stopped 11/15/17 1925)    And  vancomycin (VANCOCIN) IVPB 1000 mg/200 mL premix (1,000 mg Intravenous New Bag/Given 11/15/17 1925)  ceFEPIme (MAXIPIME)  2 g injection (not administered)  ceFEPIme (MAXIPIME) 1 g in dextrose 5 % 50 mL IVPB (not administered)  vancomycin (VANCOCIN) IVPB 750 mg/150 ml premix (not administered)  sodium chloride 0.9 % bolus 1,000 mL (0 mLs Intravenous Stopped 11/15/17 1857)  acetaminophen (TYLENOL) suppository 650 mg (650 mg Rectal Given 11/15/17 1810)  ceFEPIme (MAXIPIME) 2 g in dextrose 5 % 50 mL IVPB (0 g Intravenous Stopped 11/15/17 1857)  ipratropium-albuterol (DUONEB) 0.5-2.5 (3) MG/3ML nebulizer solution 3 mL (3 mLs Nebulization Given 11/15/17 1935)     Initial Impression / Assessment and Plan / ED Course  I have reviewed the triage vital signs and the nursing notes.  Pertinent labs & imaging results that were available during my care of the patient were reviewed by me and considered in my medical decision making (see chart for details).     Patient presents to the ED with a past medical history significant for CML currently receiving p.o. chemotherapy for evaluation of fever, chills, productive cough and shortness of breath.  Patient seen at urgent care prior to arrival with x-ray that showed concern for multifocal pneumonia.  Patient recently admitted to the hospital at the beginning of December for hypokalemia and dehydration.  On initial examination patient satting 88% on room air.   Tachycardia and febrile up to 103.  She also has tachypnea noted.  No hypotension.  Lungs with coarse sounds throughout.  Faint expiratory wheezes that are scattered.  Abdominal exam is benign.  Tachycardia noted with regular rhythm no rubs murmurs or gallops.  No lower extremity edema or calf tenderness.  Lab work reveals a leukocytosis of 18,000.  Elect lites appear patient's baseline.  Lactic acid is normal.  Mild anemia noted which appears the patient's baseline.  Blood cultures are pending.  UA is pending at this time.  Influenza is pending.  X-ray shows concern for multifocal pneumonia.  Given patient's recent hospitalization and being immunocompromised with her p.o. chemotherapy will treat patient for hospital-acquired pneumonia with cefepime and vancomycin.  Sepsis protocol was initiated given the patient's vital signs however her lactic acid is normal and patient is not hypotensive.  30 cc/kg fluid bolus was not initiated.  I spoke with Dr. Aggie Moats with hospital medicine for admission who agrees to for admission and patient be transferred by EMS to [facility].  Patient has been satting at 93% on 4 L of oxygen.  I did order 2 DuoNeb's for patient.  Vital signs remained reassuring at this time.  Patient was updated on plan of care.  Patient was also seen and evaluated by attending Dr. Laverta Baltimore who was agreeable with the above plan.   Final Clinical Impressions(s) / ED Diagnoses   Final diagnoses:  Sepsis, due to unspecified organism [facility])  HCAP (healthcare-associated pneumonia)    ED Discharge Orders    None       Aaron Edelman 11/15/17 2033    Margette Fast, MD 11/16/17 [phone]

## 2017-11-15 NOTE — Progress Notes (Signed)
Patient coughing significantly but not productive. Request something for cough. Also, continues to have temp, no Tylenol ordered. Paged on call provider for orders. Will c/t monitor.

## 2017-11-16 ENCOUNTER — Encounter (HOSPITAL_COMMUNITY): Payer: Self-pay | Admitting: Pulmonary Disease

## 2017-11-16 ENCOUNTER — Inpatient Hospital Stay (HOSPITAL_COMMUNITY): Payer: Medicare HMO

## 2017-11-16 DIAGNOSIS — A419 Sepsis, unspecified organism: Principal | ICD-10-CM

## 2017-11-16 DIAGNOSIS — E876 Hypokalemia: Secondary | ICD-10-CM

## 2017-11-16 DIAGNOSIS — R0902 Hypoxemia: Secondary | ICD-10-CM

## 2017-11-16 DIAGNOSIS — I10 Essential (primary) hypertension: Secondary | ICD-10-CM

## 2017-11-16 DIAGNOSIS — J189 Pneumonia, unspecified organism: Secondary | ICD-10-CM

## 2017-11-16 DIAGNOSIS — J9601 Acute respiratory failure with hypoxia: Secondary | ICD-10-CM

## 2017-11-16 LAB — CBC WITH DIFFERENTIAL/PLATELET
BASOS PCT: 0 %
Basophils Absolute: 0 10*3/uL (ref 0.0–0.1)
EOS ABS: 0 10*3/uL (ref 0.0–0.7)
Eosinophils Relative: 0 %
HCT: 34.4 % — ABNORMAL LOW (ref 36.0–46.0)
Hemoglobin: 11.1 g/dL — ABNORMAL LOW (ref 12.0–15.0)
LYMPHS PCT: 8 %
Lymphs Abs: 2.2 10*3/uL (ref 0.7–4.0)
MCH: 27.5 pg (ref 26.0–34.0)
MCHC: 32.3 g/dL (ref 30.0–36.0)
MCV: 85.1 fL (ref 78.0–100.0)
Monocytes Absolute: 2.2 10*3/uL — ABNORMAL HIGH (ref 0.1–1.0)
Monocytes Relative: 8 %
Neutro Abs: 22.7 10*3/uL — ABNORMAL HIGH (ref 1.7–7.7)
Neutrophils Relative %: 84 %
PLATELETS: 306 10*3/uL (ref 150–400)
RBC: 4.04 MIL/uL (ref 3.87–5.11)
RDW: 15.8 % — AB (ref 11.5–15.5)
WBC: 27.1 10*3/uL — ABNORMAL HIGH (ref 4.0–10.5)
nRBC: 0 /100 WBC

## 2017-11-16 LAB — BASIC METABOLIC PANEL
ANION GAP: 9 (ref 5–15)
BUN: 14 mg/dL (ref 6–20)
CO2: 23 mmol/L (ref 22–32)
Calcium: 8.4 mg/dL — ABNORMAL LOW (ref 8.9–10.3)
Chloride: 105 mmol/L (ref 101–111)
Creatinine, Ser: 0.92 mg/dL (ref 0.44–1.00)
GFR calc Af Amer: >60 mL/min (ref >60)
GLUCOSE: 131 mg/dL — AB (ref 65–99)
POTASSIUM: 3.4 mmol/L — AB (ref 3.5–5.1)
Sodium: 137 mmol/L (ref 135–145)

## 2017-11-16 LAB — RESPIRATORY PANEL BY PCR
Adenovirus: NOT DETECTED
Bordetella pertussis: NOT DETECTED
CORONAVIRUS OC43-RVPPCR: DETECTED — AB
Chlamydophila pneumoniae: NOT DETECTED
Coronavirus 229E: NOT DETECTED
Coronavirus HKU1: NOT DETECTED
Coronavirus NL63: NOT DETECTED
INFLUENZA A-RVPPCR: NOT DETECTED
INFLUENZA B-RVPPCR: NOT DETECTED
METAPNEUMOVIRUS-RVPPCR: NOT DETECTED
Mycoplasma pneumoniae: NOT DETECTED
PARAINFLUENZA VIRUS 1-RVPPCR: NOT DETECTED
PARAINFLUENZA VIRUS 2-RVPPCR: NOT DETECTED
PARAINFLUENZA VIRUS 4-RVPPCR: NOT DETECTED
Parainfluenza Virus 3: NOT DETECTED
RESPIRATORY SYNCYTIAL VIRUS-RVPPCR: NOT DETECTED
Rhinovirus / Enterovirus: NOT DETECTED

## 2017-11-16 LAB — GLUCOSE, CAPILLARY
GLUCOSE-CAPILLARY: 100 mg/dL — AB (ref 65–99)
GLUCOSE-CAPILLARY: 98 mg/dL (ref 65–99)
Glucose-Capillary: 146 mg/dL — ABNORMAL HIGH (ref 65–99)
Glucose-Capillary: 132 mg/dL — ABNORMAL HIGH (ref 65–99)
Glucose-Capillary: 111 mg/dL — ABNORMAL HIGH (ref 65–99)

## 2017-11-16 LAB — CBC
HCT: 35.1 % — ABNORMAL LOW (ref 36.0–46.0)
Hemoglobin: 11.3 g/dL — ABNORMAL LOW (ref 12.0–15.0)
MCH: 27.6 pg (ref 26.0–34.0)
MCHC: 32.2 g/dL (ref 30.0–36.0)
MCV: 85.6 fL (ref 78.0–100.0)
Platelets: 337 10*3/uL (ref 150–400)
RBC: 4.1 MIL/uL (ref 3.87–5.11)
RDW: 15.9 % — AB (ref 11.5–15.5)
WBC: 22.8 10*3/uL — AB (ref 4.0–10.5)

## 2017-11-16 LAB — INFLUENZA PANEL BY PCR (TYPE A & B)
INFLAPCR: NEGATIVE
Influenza B By PCR: NEGATIVE

## 2017-11-16 LAB — LACTIC ACID, PLASMA
LACTIC ACID, VENOUS: 2.1 mmol/L — AB (ref 0.5–1.9)
Lactic Acid, Venous: 1.7 mmol/L (ref 0.5–1.9)

## 2017-11-16 LAB — MRSA PCR SCREENING: MRSA by PCR: NEGATIVE

## 2017-11-16 LAB — CREATININE, SERUM
CREATININE: 0.94 mg/dL (ref 0.44–1.00)
GFR calc Af Amer: >60 mL/min (ref >60)

## 2017-11-16 LAB — PROCALCITONIN: Procalcitonin: 1.07 ng/mL

## 2017-11-16 LAB — HIV ANTIBODY (ROUTINE TESTING W REFLEX): HIV SCREEN 4TH GENERATION: NONREACTIVE

## 2017-11-16 MED ORDER — POTASSIUM CHLORIDE 10 MEQ/100ML IV SOLN: 10.0000 meq | INTRAVENOUS | Status: DC

## 2017-11-16 MED ORDER — ONDANSETRON HCL 4 MG/2ML IJ SOLN
INTRAMUSCULAR | Status: AC
  Filled 2017-11-16: qty 2

## 2017-11-16 MED ORDER — ONDANSETRON HCL 4 MG/2ML IJ SOLN
4.0000 mg | Freq: Four times a day (QID) | INTRAMUSCULAR | Status: DC | PRN
  Administered 2017-11-16: 4 mg via INTRAVENOUS

## 2017-11-16 MED ORDER — IPRATROPIUM-ALBUTEROL 0.5-2.5 (3) MG/3ML IN SOLN: 3.0000 mL | RESPIRATORY_TRACT | Status: DC | PRN

## 2017-11-16 MED ORDER — SODIUM CHLORIDE 0.9 % IV BOLUS (SEPSIS)
500.0000 mL | Freq: Once | INTRAVENOUS | Status: AC
  Administered 2017-11-16: 500 mL via INTRAVENOUS

## 2017-11-16 NOTE — Progress Notes (Signed)
Patient finally resting fairly comfortably after getting Ativan and Tylenol. Sats are 85-90% on Venturi mask. RT contacted to see patient to eval if she needed to have different O2 therapy. RT in to see patient to change cannula out to increase O2. Will ct monitor.

## 2017-11-16 NOTE — Consult Note (Signed)
[facility] PCCM CONSULT NOTE  Date of Admission: 11/15/2017 Date of Consult: 11/16/2017 Referring Provider: Dr. Carolin Sicks, [facility] Chief Complaint: Short of breath  HPI: 71 yo female presented with cough, fever.  She had outpt course of Zpak but failed to improve.  Found to have PNA on CXR at urgent care center.  Developed progressive hypoxia and transferred to ICU.  Her husband and son had similar symptoms.  She is followed at [facility] for CML that is in remission.  Past Medical History: CML, Fatty liver, DM, Gout, Hypertension, Depression, HLD, Neuropathy, GERD, ACE inhibitor angioedema, Chronic back pain, Migraines, OSA, Restless legs, Eczema, Seborrheic dermatitis, Rosacea  Past Surgical history: She  has a past surgical history that includes Back surgery; Abdominal hysterectomy; and Joint replacement.  Family History: Her father had lung cancer.  Social history: She  reports that  has never smoked. she has never used smokeless tobacco. She reports that she does not drink alcohol or use drugs.   Allergies  Allergen Reactions  . Ace Inhibitors Swelling  . Asa [Aspirin]     Ulcer   . Latex   . Morphine And Related     SOB   . Tylenol [Acetaminophen]     Ulcer     No current facility-administered medications on file prior to encounter.    Current Outpatient Medications on File Prior to Encounter  Medication Sig  . allopurinol (ZYLOPRIM) 300 MG tablet Take 300 mg by mouth daily.  [place] Kitchen amLODipine (NORVASC) 5 MG tablet Take 5 mg by mouth 2 (two) times daily.  Mar[place] Kitchen azithromycin (ZITHROMAX) 250 MG tablet Take 250-500 mg by mouth daily. 500 mg on day one, 250 mg daily days 2-5  . diclofenac sodium (VOLTAREN) 1 % GEL Apply 1 application topically daily as needed for pain. Right great toe  . DULoxetine (CYMBALTA) 60 MG capsule Take 60 mg by mouth daily.  . fluticasone (FLONASE) 50 MCG/ACT nasal spray Place 1 spray into both nostrils daily.  . furosemide (LASIX) 20 MG tablet Take 20 mg by  mouth.  Mar[place] Kitchen glipiZIDE (GLUCOTROL) 5 MG tablet Take 5 mg by mouth daily before breakfast.  . iron polysaccharides (NIFEREX) 150 MG capsule Take 150 mg by mouth daily.  [place] Kitchen LORazepam (ATIVAN) 0.5 MG tablet Take 1 mg by mouth 3 times/day as needed-between meals & bedtime for sleep.   . meclizine (ANTIVERT) 12.5 MG tablet Take 12.5 mg by mouth 3 (three) times daily as needed for dizziness.  . Multiple Vitamins-Minerals (MULTIVITAMIN WITH MINERALS) tablet Take 1 tablet by mouth daily.  . nilotinib (TASIGNA) 200 MG capsule Take 200 mg by mouth every 12 (twelve) hours. Give on an empty stomach 1 hour before or 2 hours after meals.  Mar[place] Kitchen omeprazole (PRILOSEC) 40 MG capsule Take 40 mg by mouth daily.  . potassium chloride (KLOR-CON) 20 MEQ packet Take by mouth 2 (two) times daily.  . pravastatin (PRAVACHOL) 10 MG tablet Take 10 mg by mouth daily.  . pregabalin (LYRICA) 50 MG capsule Take 100 mg by mouth at bedtime.   [place] Kitchen rOPINIRole (REQUIP) 1 MG tablet Take 4 mg by mouth at bedtime.   Mar[place] Kitchen spironolactone (ALDACTONE) 25 MG tablet Take 25 mg by mouth daily.  Mar[place] Kitchen tiZANidine (ZANAFLEX) 4 MG capsule Take 4 mg by mouth at bedtime.   . traMADol (ULTRAM) 50 MG tablet Take by mouth every 6 (six) hours as needed.   ROS: Denies chest pain, nausea, abdominal pain, skin rash, leg swelling.  Subjective: Feels like bipap is helping.  Vital signs: BP 122/60 (BP Location: Left Arm)   Pulse 100   Temp 97.7 F (36.5 C) (Axillary)   Resp (!) 33   Ht 5' (1.524 m)   Wt 210 lb 1.6 oz (95.3 kg)   SpO2 93%   BMI 41.03 kg/m   Intake/Output: I/O last 3 completed shifts: In: 2275 [P.O.:100; I.V.:725; IV Piggyback:1450] Out: -   Physical Exam:  General - pleasant Eyes - pupils reactive ENT - bipap mask on Cardiac - regular, no murmur Chest - decreased BS, no wheeze Abd - soft, non tender Ext - no edema Skin - no rashes Neuro - normal strength Psych - normal mood  Discussion: 71 yo female with CAP and acute hypoxic  respiratory failure.  Assessment/Plan:  Acute hypoxic respiratory failure 2nd to CAP. - oxygen to keep SpO2 90 to 95% - will try on high flow oxygen - Bipap prn - f/u CXR - day 2 of ABx - check respiratory viral panel >> droplet isolation pending results  History of OSA. - Bipap qhs  Hx of HTN, DM, Anxiety/depression, CML, Neuropathy, chronic back pain, RLS. - per primary team  DVT prophylaxis: Lovenox SUP: Protonix Diet: Heart healthy Goals of care: Full code  CC time 33 minutes  Chesley Mires, MD [place] 11/16/2017, 9:10 AM Pager:  [phone] After 3pm call: [phone]  FLOW SHEET  Cultures: Blood 12/23 >> Influenza PCR 12/23 >> negative Pneumococcal Ag 12/23 >> Respiratory viral panel 12/24 >>   Antibiotics: Vancomycin 12/23 >> Cefepime 12/23 >>   Studies:  Events: 12/23 Admit 12/24 Transfer to ICU  Lines/tubes:  Consults:  Resolved Problems: Lactic acidosis  Labs: CMP Latest Ref Rng & Units 11/16/2017 11/15/2017 11/15/2017  Glucose 65 - 99 mg/dL 131(H) - 128(H)  BUN 6 - 20 mg/dL 14 - 12  Creatinine 0.44 - 1.00 mg/dL 0.92 0.94 0.90  Sodium 135 - 145 mmol/L 137 - 137  Potassium 3.5 - 5.1 mmol/L 3.4(L) - 3.4(L)  Chloride 101 - 111 mmol/L 105 - 102  CO2 22 - 32 mmol/L 23 - 24  Calcium 8.9 - 10.3 mg/dL 8.4(L) - 8.8(L)  Total Protein 6.5 - 8.1 g/dL - - 7.6  Total Bilirubin 0.3 - 1.2 mg/dL - - 1.7(H)  Alkaline Phos 38 - 126 U/L - - 115  AST 15 - 41 U/L - - 36  ALT 14 - 54 U/L - - 20    CBC Latest Ref Rng & Units 11/16/2017 11/15/2017 11/15/2017  WBC 4.0 - 10.5 K/uL 27.1(H) 22.8(H) 18.8(H)  Hemoglobin 12.0 - 15.0 g/dL 11.1(L) 11.3(L) 11.8(L)  Hematocrit 36.0 - 46.0 % 34.4(L) 35.1(L) 36.5  Platelets 150 - 400 K/uL 306 337 332    CBG (last 3)  Recent Labs    11/15/17 2357 11/16/17 0735  GLUCAP 110* 146*    Imaging: Dg Chest 1 View  Result Date: 11/16/2017 CLINICAL DATA:  Hypoxia EXAM: CHEST 1 VIEW COMPARISON:   11/15/2017 FINDINGS: Cardiomegaly. Vascular congestion. Fullness of the right hilum could be related to vessels or adenopathy. Patchy airspace disease bilaterally, most confluent in the right upper lobe and left lower lobe concerning for pneumonia. Findings have progressed since prior study. IMPRESSION: Progressive bilateral airspace opacities concerning for pneumonia. Cardiomegaly, vascular congestion. Right hilar fullness may be vascular or related to adenopathy. Electronically Signed   By: Rolm Baptise M.D.   On: 11/16/2017 08:34   Dg Chest 2 View  Result Date: 11/15/2017 CLINICAL DATA:  Cough, congestion, fever EXAM: CHEST  2 VIEW COMPARISON:  None. FINDINGS: Airspace opacity noted in the right upper lobe and perihilar right lower lobe, likely pneumonia. Mild cardiomegaly. No confluent opacity on the left. No effusions or acute bony abnormality. IMPRESSION: Airspace opacities in the right upper lobe and perihilar right lower lobe concerning for pneumonia. Followup PA and lateral chest X-ray is recommended in 3-4 weeks following trial of antibiotic therapy to ensure resolution and exclude underlying malignancy. Electronically Signed   By: Rolm Baptise M.D.   On: 11/15/2017 17:49

## 2017-11-16 NOTE — Care Management Note (Signed)
Case Management Note  Patient Details  Name: Shaily Librizzi MRN: [id number] Date of Birth: 1946-11-03  Subjective/Objective:                  sepsis  Action/Plan: Date: November 16, 2017 Velva Harman, BSN, [place], [place] Chart and notes review for patient progress and needs. Will follow for case management and discharge needs. Next review date: [id number]  Expected Discharge Date:                  Expected Discharge Plan:  Home/Self Care  In-House Referral:     Discharge planning Services  CM Consult  Post Acute Care Choice:    Choice offered to:     DME Arranged:    DME Agency:     HH Arranged:    HH Agency:     Status of Service:  In process, will continue to follow  If discussed at Long Length of Stay Meetings, dates discussed:    Additional Comments:  Leeroy Cha, RN 11/16/2017, 8:55 AM

## 2017-11-16 NOTE — Progress Notes (Signed)
CRITICAL VALUE ALERT  Critical Value:  Lactic 2.1  Date & Time Notied:  11/16/17 0253  Provider Notified: NP [place]  Orders Received/Actions taken: waiting for cb

## 2017-11-16 NOTE — Progress Notes (Signed)
Pt breathing out her mouth. Desating to 81 on HFlow 40l 100%. Placed back on NRB sats 90%

## 2017-11-16 NOTE — Progress Notes (Addendum)
PROGRESS NOTE    Wanda Hanson  PVV:[id number] DOB: 11-24-1946 DOA: 11/15/2017 PCP: System, Pcp Not In   Brief Narrative: 71 year old female with history of CML, liver disease presented to the hospital with cough and fever for 2 weeks.  As per H&P, patient completed course of azithromycin without improvement.  She went to urgent care for further evaluation.  She was diagnosed with pneumonia and sent to the hospital for further evaluation.  The chest x-ray showed pneumonia and is started on broad-spectrum antibiotics.  Patient decompensated today morning.  Requiring nonrebreather with oxygen saturation on high 80s.  Plan to transfer to stepdown unit.  Discussed with the respiratory team and consulted pulmonary critical care.    Assessment & Plan:   #Sepsis due to right upper lobe and lower lobe pneumonia: -Patient with respiratory distress and worsening hypoxia today.  Placed on nonrebreather.  Patient's mental status is stable.  She has difficulty completing sentences because of increased breathing.  Plan to get chest x-ray, ABG.  Rapid response called.  Plan to transfer patient to stepdown unit.  May need BiPAP temporarily.  Continue broad-spectrum antibiotics, bronchodilators.  Follow-up culture results.  I consulted pulmonary critical care and discussed with Dr. Halford Chessman.  I have discussed with the patient, patient's nurses and the respiratory team. -Continue IV fluid. -Check RSV, pro-calcitonin level -Flexion negative.  #Hypertension: Continue current medication.  Monitor blood pressure.  Discontinue diuretics.  #Type 2 diabetes: Continue sliding scale.  Monitor blood sugar level.  #Anxiety depression: Continue Cymbalta, Ativan as needed.  #History of CML: Continue allopurinol, home medication.  #Hypokalemia: Replete potassium chloride.  Continue current medical and supportive care.  Patient is sick.  She will be transferred to stepdown for further evaluation.   DVT prophylaxis:  Lovenox subcutaneous Code Status: Full code Family Communication: No family at bedside Disposition Plan: Transferring to stepdown unit.    Consultants:   Pulmonary critical care  Procedures: Nonrebreather Antimicrobials: Vancomycin and cefepime.  Subjective: Seen and examined at bedside.  Patient with increased work of breathing, shortness of breath.  Requiring nonrebreather for the management of her hypoxia.  Denies chest pain, headache or dizziness.  Objective: Vitals:   11/16/17 0510 11/16/17 0636 11/16/17 0745 11/16/17 0804  BP: 122/60     Pulse: 91     Resp: (!) 25   (!) 40  Temp: 97.7 F (36.5 C)     TempSrc: Axillary     SpO2: 92% 90% (!) 89% 90%  Weight: 95.9 kg (211 lb 6.7 oz)     Height:        Intake/Output Summary (Last 24 hours) at 11/16/2017 0818 Last data filed at 11/16/2017 7544 Gross per 24 hour  Intake 2275 ml  Output -  Net 2275 ml   Filed Weights   11/15/17 1720 11/16/17 0510  Weight: 92.1 kg (203 lb) 95.9 kg (211 lb 6.7 oz)    Examination:  General exam: Sick female lying on bed with increased work of breathing.  On nonrebreather. Respiratory system: Bilateral coarse breath sound.  Increased respiratory rate.  Cardiovascular system: S1 & S2 heard, RRR.  No pedal edema. Gastrointestinal system: Abdomen is nondistended, soft and nontender. Normal bowel sounds heard. Central nervous system: Alert and oriented. No focal neurological deficits. Extremities: Symmetric 5 x 5 power. Skin: No rashes, lesions or ulcers Psychiatry: Judgement and insight appear normal. Mood & affect appropriate.     Data Reviewed: I have personally reviewed following labs and imaging studies  CBC: Recent Labs  Lab 11/15/17 1750 11/15/17 2300 11/16/17 0249  WBC 18.8* 22.8* 27.1*  NEUTROABS 15.8  --  22.7*  HGB 11.8* 11.3* 11.1*  HCT 36.5 35.1* 34.4*  MCV 85.3 85.6 85.1  PLT 332 337 751   Basic Metabolic Panel: Recent Labs  Lab 11/15/17 1750  11/15/17 2300 11/16/17 0249  NA 137  --  137  K 3.4*  --  3.4*  CL 102  --  105  CO2 24  --  23  GLUCOSE 128*  --  131*  BUN 12  --  14  CREATININE 0.90 0.94 0.92  CALCIUM 8.8*  --  8.4*   GFR: Estimated Creatinine Clearance: 59.9 mL/min (by C-G formula based on SCr of 0.92 mg/dL). Liver Function Tests: Recent Labs  Lab 11/15/17 1750  AST 36  ALT 20  ALKPHOS 115  BILITOT 1.7*  PROT 7.6  ALBUMIN 3.5   No results for input(s): LIPASE, AMYLASE in the last 168 hours. No results for input(s): AMMONIA in the last 168 hours. Coagulation Profile: Recent Labs  Lab 11/15/17 1750  INR 1.13   Cardiac Enzymes: No results for input(s): CKTOTAL, CKMB, CKMBINDEX, TROPONINI in the last 168 hours. BNP (last 3 results) No results for input(s): PROBNP in the last 8760 hours. HbA1C: No results for input(s): HGBA1C in the last 72 hours. CBG: Recent Labs  Lab 11/15/17 2357 11/16/17 0735  GLUCAP 110* 146*   Lipid Profile: No results for input(s): CHOL, HDL, LDLCALC, TRIG, CHOLHDL, LDLDIRECT in the last 72 hours. Thyroid Function Tests: No results for input(s): TSH, T4TOTAL, FREET4, T3FREE, THYROIDAB in the last 72 hours. Anemia Panel: No results for input(s): VITAMINB12, FOLATE, FERRITIN, TIBC, IRON, RETICCTPCT in the last 72 hours. Sepsis Labs: Recent Labs  Lab 11/15/17 1810 11/15/17 2356 11/16/17 0249  LATICACIDVEN 1.63 2.1* 1.7    No results found for this or any previous visit (from the past 240 hour(s)).       Radiology Studies: Dg Chest 2 View  Result Date: 11/15/2017 CLINICAL DATA:  Cough, congestion, fever EXAM: CHEST  2 VIEW COMPARISON:  None. FINDINGS: Airspace opacity noted in the right upper lobe and perihilar right lower lobe, likely pneumonia. Mild cardiomegaly. No confluent opacity on the left. No effusions or acute bony abnormality. IMPRESSION: Airspace opacities in the right upper lobe and perihilar right lower lobe concerning for pneumonia. Followup PA  and lateral chest X-ray is recommended in 3-4 weeks following trial of antibiotic therapy to ensure resolution and exclude underlying malignancy. Electronically Signed   By: Rolm Baptise M.D.   On: 11/15/2017 17:49        Scheduled Meds: . allopurinol  300 mg Oral Daily  . amLODipine  5 mg Oral BID  . DULoxetine  60 mg Oral Daily  . enoxaparin (LOVENOX) injection  40 mg Subcutaneous QHS  . fluticasone  1 spray Each Nare Daily  . guaiFENesin  600 mg Oral BID  . insulin aspart  0-15 Units Subcutaneous TID WC  . ipratropium-albuterol  3 mL Nebulization Q4H  . pantoprazole  40 mg Oral Daily  . potassium chloride SA  20 mEq Oral BID  . pravastatin  10 mg Oral Daily  . rOPINIRole  4 mg Oral QHS  . spironolactone  25 mg Oral Daily   Continuous Infusions: . sodium chloride 100 mL/hr at 11/15/17 2320  . ceFEPime (MAXIPIME) IV Stopped (11/16/17 0319)  . vancomycin 750 mg (11/16/17 0635)     LOS: 1 day    Dron  Tanna Furry, MD [facility] Pager [phone]  If 7PM-7AM, please contact night-coverage [email or web address] Password [facility] 11/16/2017, 8:18 AM

## 2017-11-16 NOTE — Progress Notes (Signed)
Upon initial assessment, pt has an O2 saturation of 83 whilst on 15 L Hi flow [place]. Pt now on non re-breather at 15L and has a saturation of 90-83 RR of 40 Writer  And charge are bedside and MD aware

## 2017-11-16 NOTE — Significant Event (Signed)
Rapid Response Event Note  Overview: Time Called: 0809 Arrival Time: 0812 Event Type: Respiratory  Notified by 4E Charge RN that patient was on non-re breather and O2 saturations were in low 80s. Patient had been switched from HFNC 15Liters at approx. 0100. Upon arrival, patient was alert and oriented x3, but utilizing accessory muscle use to breathe, as well as having increased respirations.     Initial Focused Assessment: Neuro: Patient neuro intact, alert and oriented x 4, pupils 2-3, equal, round and reactive to light. Patient moves all extremities purposefully to commands. Patient having no issues with pain at this time. Cardiac: S1, S2 heart sounds auscultated, patient radial and pedal pulses 1-2+ bilateral extremities. HR 90s-100s SR-ST. BP slightly HTN, see vital signs Pulmonary: Breath sounds were very diminished in all lung fields with some rhonchi, tachypnea RR 30-40s, SOB, patient has very little reserve, utilizing accessory muscles to compensate hypoxia. O2 Sats 80-85 on NRB.  Interventions: MD Carolin Sicks paged and at beside STAT ABG and CXR ordered Transfer to SDU for [place] (if not transferred):  Event Summary:   at      at          [facility]

## 2017-11-16 NOTE — Progress Notes (Signed)
RT called to bedside due to Pt's SAT's being in mid 80's on 55% VM.  RT placed pt on 15 LPM [place],  Pt's O2 SATS 90% at this time.  RN notified, RT to monitor and assess as needed.

## 2017-11-17 ENCOUNTER — Inpatient Hospital Stay (HOSPITAL_COMMUNITY): Payer: Medicare HMO

## 2017-11-17 LAB — CBC
HEMATOCRIT: 36.7 % (ref 36.0–46.0)
HEMOGLOBIN: 11.8 g/dL — AB (ref 12.0–15.0)
MCH: 27.5 pg (ref 26.0–34.0)
MCHC: 32.2 g/dL (ref 30.0–36.0)
MCV: 85.5 fL (ref 78.0–100.0)
Platelets: 290 10*3/uL (ref 150–400)
RBC: 4.29 MIL/uL (ref 3.87–5.11)
RDW: 16.1 % — ABNORMAL HIGH (ref 11.5–15.5)
WBC: 23.5 10*3/uL — AB (ref 4.0–10.5)

## 2017-11-17 LAB — GLUCOSE, CAPILLARY
GLUCOSE-CAPILLARY: 110 mg/dL — AB (ref 65–99)
GLUCOSE-CAPILLARY: 99 mg/dL (ref 65–99)
GLUCOSE-CAPILLARY: 106 mg/dL — AB (ref 65–99)
Glucose-Capillary: 155 mg/dL — ABNORMAL HIGH (ref 65–99)

## 2017-11-17 LAB — RENAL FUNCTION PANEL
ALBUMIN: 2.9 g/dL — AB (ref 3.5–5.0)
ANION GAP: 7 (ref 5–15)
BUN: 14 mg/dL (ref 6–20)
CO2: 23 mmol/L (ref 22–32)
Calcium: 8.6 mg/dL — ABNORMAL LOW (ref 8.9–10.3)
Chloride: 107 mmol/L (ref 101–111)
Creatinine, Ser: 0.78 mg/dL (ref 0.44–1.00)
GFR calc Af Amer: >60 mL/min (ref >60)
Glucose, Bld: 108 mg/dL — ABNORMAL HIGH (ref 65–99)
PHOSPHORUS: 2.3 mg/dL — AB (ref 2.5–4.6)
POTASSIUM: 4.7 mmol/L (ref 3.5–5.1)
Sodium: 137 mmol/L (ref 135–145)

## 2017-11-17 MED ORDER — METHYLPREDNISOLONE SODIUM SUCC 40 MG IJ SOLR
40.0000 mg | Freq: Four times a day (QID) | INTRAMUSCULAR | Status: DC
  Administered 2017-11-17 – 2017-11-19 (×8): 40 mg via INTRAVENOUS
  Filled 2017-11-17 (×7): qty 1

## 2017-11-17 MED ORDER — LACTATED RINGERS IV SOLN
INTRAVENOUS | Status: DC
  Administered 2017-11-17: 12:00:00 via INTRAVENOUS

## 2017-11-17 NOTE — Progress Notes (Signed)
Took Pt off BIPAP and placed her on HHFNC 100% and 30P. Pt's SATS 94%, HR 95, and BP 124/62. RT will continue to monitor

## 2017-11-17 NOTE — Progress Notes (Signed)
[place] PCCM PROGRESS NOTE  Date of Admission: 11/15/2017 Date of Consult: 11/16/2017 Referring Provider: Dr. Carolin Sicks, [facility] Chief Complaint: Short of breath  HPI: 71 yo female presented with cough, fever.  She had outpt course of Zpak but failed to improve.  Found to have PNA on CXR at urgent care center.  Developed progressive hypoxia and transferred to ICU.  Her husband and son had similar symptoms.  She is followed at [facility] for CML that is in remission.  Past Medical History: CML, Fatty liver, DM, Gout, Hypertension, Depression, HLD, Neuropathy, GERD, ACE inhibitor angioedema, Chronic back pain, Migraines, OSA, Restless legs, Eczema, Seborrheic dermatitis, Rosacea  Subjective: Still feels nauseous.  Feels like Bipap helps her breath better than high flow oxygen.  Vital signs: BP (!) 122/55   Pulse 91   Temp 98.5 F (36.9 C) (Axillary)   Resp (!) 37   Ht 5' (1.524 m)   Wt 208 lb 15.9 oz (94.8 kg)   SpO2 97%   BMI 40.82 kg/m   Intake/Output: I/O last 3 completed shifts: In: 2133.3 [P.O.:100; I.V.:1183.3; IV Piggyback:850] Out: -   Physical Exam:  General - alert, speaking in full sentences Eyes - pupils reactive ENT - no sinus tenderness Cardiac - regular, no murmur Chest - scattered rhonchi Rt > Lt  Abd - soft, non tender Ext - no edema Skin - no rashes Neuro - normal strength Psych - anxious  Discussion: 71 yo female with CAP and acute hypoxic respiratory failure.  She likely started with Corona virus infection complicated by bacterial superinfection.  Assessment/Plan:  Acute hypoxic respiratory failure 2nd to CAP. - continue Bipap with transition to high flow oxygen as able - oxygen to keep SpO2 90 to 95% - monitor need for intubation - f/u CXR - day 3 of Abx - will add solumedrol  History of OSA. - Bipap qhs  Nausea. - prn zofran  Hx of HTN, HLD. - norvasc, aldactone, pravachol - hold outpt lasix  Hx of DM, gout. - SSI - hold outpt  glucotrol - allopurinol  Hx of anxiety/depression, neuropathy, chronic back pain, RLS. - cymbalta, requip  Hx of CML. - hold tasigna  DVT prophylaxis: Lovenox SUP: Protonix Diet: Clear liquid diet Goals of care: Full code  Had extensive d/w pt and her husband.  Explained that her status is still tenuous, but she is holding her own with Bipap.  She would like to continue to try current therapies and monitor.  If she gets worse, then very low threshold to proceed with intubation.  Change to ICU status.  Will change to PCCM service while she is critically ill.  CC time 33 minutes  Chesley Mires, MD [facility] Pulmonary/Critical Care 11/17/2017, 8:38 AM Pager:  [phone] After 3pm call: [phone]  FLOW SHEET  Cultures: Blood 12/23 >> Influenza PCR 12/23 >> negative Pneumococcal Ag 12/23 >> Respiratory viral panel 12/24 >> Corona virus  Antibiotics: Vancomycin 12/23 >> Cefepime 12/23 >>   Studies:  Events: 12/23 Admit 12/24 Transfer to ICU  Lines/tubes:  Consults:  Resolved Problems: Lactic acidosis  Labs: CMP Latest Ref Rng & Units 11/16/2017 11/15/2017 11/15/2017  Glucose 65 - 99 mg/dL 131(H) - 128(H)  BUN 6 - 20 mg/dL 14 - 12  Creatinine 0.44 - 1.00 mg/dL 0.92 0.94 0.90  Sodium 135 - 145 mmol/L 137 - 137  Potassium 3.5 - 5.1 mmol/L 3.4(L) - 3.4(L)  Chloride 101 - 111 mmol/L 105 - 102  CO2 22 - 32 mmol/L 23 - 24  Calcium 8.9 - 10.3 mg/dL 8.4(L) - 8.8(L)  Total Protein 6.5 - 8.1 g/dL - - 7.6  Total Bilirubin 0.3 - 1.2 mg/dL - - 1.7(H)  Alkaline Phos 38 - 126 U/L - - 115  AST 15 - 41 U/L - - 36  ALT 14 - 54 U/L - - 20    CBC Latest Ref Rng & Units 11/17/2017 11/16/2017 11/15/2017  WBC 4.0 - 10.5 K/uL 23.5(H) 27.1(H) 22.8(H)  Hemoglobin 12.0 - 15.0 g/dL 11.8(L) 11.1(L) 11.3(L)  Hematocrit 36.0 - 46.0 % 36.7 34.4(L) 35.1(L)  Platelets 150 - 400 K/uL 290 306 337    CBG (last 3)  Recent Labs    11/16/17 1557 11/16/17 2127 11/17/17 0725  GLUCAP 100*  98 110*    Imaging: Dg Chest 1 View  Result Date: 11/16/2017 CLINICAL DATA:  Hypoxia EXAM: CHEST 1 VIEW COMPARISON:  11/15/2017 FINDINGS: Cardiomegaly. Vascular congestion. Fullness of the right hilum could be related to vessels or adenopathy. Patchy airspace disease bilaterally, most confluent in the right upper lobe and left lower lobe concerning for pneumonia. Findings have progressed since prior study. IMPRESSION: Progressive bilateral airspace opacities concerning for pneumonia. Cardiomegaly, vascular congestion. Right hilar fullness may be vascular or related to adenopathy. Electronically Signed   By: Rolm Baptise M.D.   On: 11/16/2017 08:34   Dg Chest 2 View  Result Date: 11/15/2017 CLINICAL DATA:  Cough, congestion, fever EXAM: CHEST  2 VIEW COMPARISON:  None. FINDINGS: Airspace opacity noted in the right upper lobe and perihilar right lower lobe, likely pneumonia. Mild cardiomegaly. No confluent opacity on the left. No effusions or acute bony abnormality. IMPRESSION: Airspace opacities in the right upper lobe and perihilar right lower lobe concerning for pneumonia. Followup PA and lateral chest X-ray is recommended in 3-4 weeks following trial of antibiotic therapy to ensure resolution and exclude underlying malignancy. Electronically Signed   By: Rolm Baptise M.D.   On: 11/15/2017 17:49   Dg Chest Port 1 View  Result Date: 11/17/2017 CLINICAL DATA:  Pneumonia EXAM: PORTABLE CHEST 1 VIEW COMPARISON:  11/16/2017 FINDINGS: Areas of consolidation in the right upper lobe, right lower lobe and left lower lobe again noted, slightly improved on the left since prior study, unchanged on the right. Heart is mildly enlarged. IMPRESSION: Bilateral airspace opacities with slight improvement on the left since prior study. Electronically Signed   By: Rolm Baptise M.D.   On: 11/17/2017 07:09

## 2017-11-18 ENCOUNTER — Inpatient Hospital Stay (HOSPITAL_COMMUNITY): Payer: Medicare HMO

## 2017-11-18 LAB — BASIC METABOLIC PANEL
ANION GAP: 7 (ref 5–15)
BUN: 17 mg/dL (ref 6–20)
CALCIUM: 8.8 mg/dL — AB (ref 8.9–10.3)
CO2: 22 mmol/L (ref 22–32)
Chloride: 109 mmol/L (ref 101–111)
Creatinine, Ser: 0.67 mg/dL (ref 0.44–1.00)
GFR calc Af Amer: >60 mL/min (ref >60)
Glucose, Bld: 167 mg/dL — ABNORMAL HIGH (ref 65–99)
POTASSIUM: 4.4 mmol/L (ref 3.5–5.1)
SODIUM: 138 mmol/L (ref 135–145)

## 2017-11-18 LAB — GLUCOSE, CAPILLARY
GLUCOSE-CAPILLARY: 108 mg/dL — AB (ref 65–99)
GLUCOSE-CAPILLARY: 112 mg/dL — AB (ref 65–99)
GLUCOSE-CAPILLARY: 143 mg/dL — AB (ref 65–99)
Glucose-Capillary: 134 mg/dL — ABNORMAL HIGH (ref 65–99)

## 2017-11-18 LAB — CBC
HEMATOCRIT: 35.2 % — AB (ref 36.0–46.0)
HEMOGLOBIN: 11.5 g/dL — AB (ref 12.0–15.0)
MCH: 28.3 pg (ref 26.0–34.0)
MCHC: 32.7 g/dL (ref 30.0–36.0)
MCV: 86.7 fL (ref 78.0–100.0)
Platelets: 310 10*3/uL (ref 150–400)
RBC: 4.06 MIL/uL (ref 3.87–5.11)
RDW: 16.1 % — AB (ref 11.5–15.5)
WBC: 19.6 10*3/uL — AB (ref 4.0–10.5)

## 2017-11-18 MED ORDER — FUROSEMIDE 10 MG/ML IJ SOLN
20.0000 mg | Freq: Every day | INTRAMUSCULAR | Status: DC
  Administered 2017-11-18 – 2017-11-20 (×3): 20 mg via INTRAVENOUS
  Filled 2017-11-18 (×3): qty 2

## 2017-11-18 NOTE — Progress Notes (Signed)
[place] PCCM PROGRESS NOTE  Date of Admission: 11/15/2017 Date of Consult: 11/16/2017 Referring Provider: Dr. Carolin Sicks, [facility] Chief Complaint: Short of breath  HPI: 71 yo female presented with cough, fever.  She had outpt course of Zpak but failed to improve.  Found to have PNA on CXR at urgent care center.  Developed progressive hypoxia and transferred to ICU.  Her husband and son had similar symptoms.  She is followed at [facility] for CML that is in remission.  Past Medical History: CML, Fatty liver, DM, Gout, Hypertension, Depression, HLD, Neuropathy, GERD, ACE inhibitor angioedema, Chronic back pain, Migraines, OSA, Restless legs, Eczema, Seborrheic dermatitis, Rosacea  Subjective: Breathing more comfortable.  Slept better last night.  Appetite improving.  Vital signs: BP 137/70   Pulse 73   Temp 97.8 F (36.6 C) (Axillary)   Resp (!) 30   Ht 5' (1.524 m)   Wt 208 lb 8.9 oz (94.6 kg)   SpO2 91%   BMI 40.73 kg/m   Intake/Output: I/O last 3 completed shifts: In: 1967.5 [P.O.:240; I.V.:877.5; IV Piggyback:850] Out: -   Physical Exam:  General - pleasant Eyes - pupils reactive ENT - Bipap mask on Cardiac - regular, no murmur Chest - no wheeze, rales Abd - soft, non tender Ext - no edema Skin - no rashes Neuro - normal strength Psych - normal mood  Discussion: 71 yo female with CAP and acute hypoxic respiratory failure.  She likely started with Corona virus infection complicated by bacterial superinfection.  Showing some improvement clinically 12/26.  Assessment/Plan:  Acute hypoxic respiratory failure 2nd to CAP. - Bipap and supplemental oxygen - hopeful will avoid need for intubation - Day 4 of Abx, continue cefepime and d/c vancomycin - continue solumedrol - f/u CXR intermittently  History of OSA. - Bipap qhs  Nausea. - prn zofran  Hx of HTN, HLD. - norvasc, aldactone, pravachol - resume lasix - KVO IV fluids  Hx of DM, gout. - SSI - hold outpt  glucotrol - allopurinol  Hx of anxiety/depression, neuropathy, chronic back pain, RLS. - cymbalta, requip  Hx of CML. - hold tasigna  DVT prophylaxis: Lovenox SUP: Protonix Diet: Clear liquid diet Goals of care: Full code  Updated her husband at bedside  Chesley Mires, MD [place] 11/18/2017, 10:19 AM Pager:  [phone] After 3pm call: [phone]  FLOW SHEET  Cultures: Blood 12/23 >> Influenza PCR 12/23 >> negative Respiratory viral panel 12/24 >> Corona virus  Antibiotics: Vancomycin 12/23 >> 12/26 Cefepime 12/23 >>   Studies:  Events: 12/23 Admit 12/24 Transfer to ICU 12/25 Start solumedrol  Lines/tubes:  Consults:  Resolved Problems: Lactic acidosis  Labs: CMP Latest Ref Rng & Units 11/18/2017 11/17/2017 11/16/2017  Glucose 65 - 99 mg/dL 167(H) 108(H) 131(H)  BUN 6 - 20 mg/dL 17 14 14   Creatinine 0.44 - 1.00 mg/dL 0.67 0.78 0.92  Sodium 135 - 145 mmol/L 138 137 137  Potassium 3.5 - 5.1 mmol/L 4.4 4.7 3.4(L)  Chloride 101 - 111 mmol/L 109 107 105  CO2 22 - 32 mmol/L 22 23 23   Calcium 8.9 - 10.3 mg/dL 8.8(L) 8.6(L) 8.4(L)  Total Protein 6.5 - 8.1 g/dL - - -  Total Bilirubin 0.3 - 1.2 mg/dL - - -  Alkaline Phos 38 - 126 U/L - - -  AST 15 - 41 U/L - - -  ALT 14 - 54 U/L - - -    CBC Latest Ref Rng & Units 11/18/2017 11/17/2017 11/16/2017  WBC 4.0 -  10.5 K/uL 19.6(H) 23.5(H) 27.1(H)  Hemoglobin 12.0 - 15.0 g/dL 11.5(L) 11.8(L) 11.1(L)  Hematocrit 36.0 - 46.0 % 35.2(L) 36.7 34.4(L)  Platelets 150 - 400 K/uL 310 290 306    CBG (last 3)  Recent Labs    11/17/17 1701 11/17/17 2113 11/18/17 0801  GLUCAP 106* 155* 134*    Imaging: Dg Chest Port 1 View  Result Date: 11/18/2017 CLINICAL DATA:  Respiratory failure EXAM: PORTABLE CHEST 1 VIEW COMPARISON:  November 17, 2017 FINDINGS: There is persistent patchy airspace consolidation throughout portions of the right upper and lower lobes as well as in the left base. In  comparison with 1 day prior, there is increased consolidation left base with similar changes on the right. There is a new small right pleural effusion. There is cardiomegaly with pulmonary venous hypertension. There is aortic atherosclerosis. No adenopathy. No bone lesions. IMPRESSION: Underlying pulmonary vascular congestion. Apparent multifocal pneumonia with stable areas of patchy opacity in the right upper and right lower lobes with increase in consolidation left base. New small right pleural effusion. There is aortic atherosclerosis. Aortic Atherosclerosis (ICD10-I70.0). Electronically Signed   By: Lowella Grip III M.D.   On: 11/18/2017 07:04   Dg Chest Port 1 View  Result Date: 11/17/2017 CLINICAL DATA:  Pneumonia EXAM: PORTABLE CHEST 1 VIEW COMPARISON:  11/16/2017 FINDINGS: Areas of consolidation in the right upper lobe, right lower lobe and left lower lobe again noted, slightly improved on the left since prior study, unchanged on the right. Heart is mildly enlarged. IMPRESSION: Bilateral airspace opacities with slight improvement on the left since prior study. Electronically Signed   By: Rolm Baptise M.D.   On: 11/17/2017 07:09

## 2017-11-18 NOTE — Progress Notes (Signed)
Pharmacy Antibiotic Note  Maude Hettich is a 71 y.o. female admitted on 11/15/2017 with pneumonia.  Pharmacy has been consulted for vancomycin and cefepime dosing.  11/18/2017:  Day#3 abx  Tmax is 100.1    Leukocytosis improving  PCT 1.07 (no previous level checked for trends)  Renal function improved to baseline, NCrCl ~ 44ml/min  Respiratory Panel + Coronavirus, MRSA PCR negative  Plan: Stop Vancomycin Continue Cefepime 1gm IV Q8H No further dose adjustments anticipated.  Pharmacy will sign off & monitor peripherally via electronic surveillance software for changes in renal function & +cx data.   Height: 5' (152.4 cm) Weight: 208 lb 8.9 oz (94.6 kg) IBW/kg (Calculated) : 45.5  Temp (24hrs), Avg:98.9 F (37.2 C), Min:97.7 F (36.5 C), Max:100.7 F (38.2 C)  Recent Labs  Lab 11/15/17 1750 11/15/17 1810 11/15/17 2300 11/15/17 2356 11/16/17 0249 11/17/17 0331 11/18/17 0330  WBC 18.8*  --  22.8*  --  27.1* 23.5* 19.6*  CREATININE 0.90  --  0.94  --  0.92 0.78 0.67  LATICACIDVEN  --  1.63  --  2.1* 1.7  --   --     Estimated Creatinine Clearance: 68.2 mL/min (by C-G formula based on SCr of 0.67 mg/dL).    Antimicrobials this admission: Vanc 12/23>> Cefepime 12/23>>  Dose adjustments this admission: N/A  Microbiology results: Microbiology results:  12/23 BCx: NGTD 12/23 HIV antibody:  NR 12/23 Strep pneumo antigen:   12/24 MRSA PCR: negative 12/23 Influenza PCR: negative 12/24 Resp PCR: +Coronavirus  Thank you for allowing pharmacy to be a part of this patient's care.  Biagio Borg 11/18/2017 9:26 AM

## 2017-11-18 NOTE — Progress Notes (Signed)
RT removed the BIPAP off the Pt and placed her 15L salter. Pt is tolerating the salter at 15L. RT will continue to monitor

## 2017-11-18 NOTE — Progress Notes (Signed)
Pt taken off  BIPAP and  placed on salter [place] at 15L with sats 95%. RT will continue to monitor

## 2017-11-19 DIAGNOSIS — J129 Viral pneumonia, unspecified: Secondary | ICD-10-CM

## 2017-11-19 LAB — BASIC METABOLIC PANEL
Anion gap: 7 (ref 5–15)
BUN: 27 mg/dL — AB (ref 6–20)
CALCIUM: 9.2 mg/dL (ref 8.9–10.3)
CHLORIDE: 107 mmol/L (ref 101–111)
CO2: 25 mmol/L (ref 22–32)
CREATININE: 0.88 mg/dL (ref 0.44–1.00)
GFR calc non Af Amer: >60 mL/min (ref >60)
GLUCOSE: 143 mg/dL — AB (ref 65–99)
Potassium: 4.1 mmol/L (ref 3.5–5.1)
Sodium: 139 mmol/L (ref 135–145)

## 2017-11-19 LAB — GLUCOSE, CAPILLARY
GLUCOSE-CAPILLARY: 132 mg/dL — AB (ref 65–99)
GLUCOSE-CAPILLARY: 150 mg/dL — AB (ref 65–99)
GLUCOSE-CAPILLARY: 109 mg/dL — AB (ref 65–99)
Glucose-Capillary: 160 mg/dL — ABNORMAL HIGH (ref 65–99)

## 2017-11-19 LAB — CBC
HCT: 37.1 % (ref 36.0–46.0)
Hemoglobin: 12.1 g/dL (ref 12.0–15.0)
MCH: 27.9 pg (ref 26.0–34.0)
MCHC: 32.6 g/dL (ref 30.0–36.0)
MCV: 85.5 fL (ref 78.0–100.0)
PLATELETS: 323 10*3/uL (ref 150–400)
RBC: 4.34 MIL/uL (ref 3.87–5.11)
RDW: 15.9 % — AB (ref 11.5–15.5)
WBC: 18.3 10*3/uL — ABNORMAL HIGH (ref 4.0–10.5)

## 2017-11-19 MED ORDER — METHYLPREDNISOLONE SODIUM SUCC 40 MG IJ SOLR
20.0000 mg | Freq: Three times a day (TID) | INTRAMUSCULAR | Status: DC
  Administered 2017-11-19 – 2017-11-20 (×3): 20 mg via INTRAVENOUS
  Filled 2017-11-19 (×3): qty 1

## 2017-11-19 MED ORDER — DIPHENHYDRAMINE HCL 50 MG PO CAPS
50.0000 mg | ORAL_CAPSULE | Freq: Every evening | ORAL | Status: DC | PRN
  Administered 2017-11-19 – 2017-11-20 (×2): 50 mg via ORAL
  Filled 2017-11-19 (×2): qty 1

## 2017-11-19 MED ORDER — LACTATED RINGERS IV SOLN: INTRAVENOUS | Status: DC | PRN

## 2017-11-19 MED ORDER — BENZONATATE 100 MG PO CAPS
200.0000 mg | ORAL_CAPSULE | Freq: Three times a day (TID) | ORAL | Status: DC | PRN
  Administered 2017-11-19 – 2017-11-21 (×5): 200 mg via ORAL
  Filled 2017-11-19 (×5): qty 2

## 2017-11-19 NOTE — Progress Notes (Signed)
Date: November 19, 2017 Rhonda Davis, BSN, RN3, CCM [phone] Chart and notes review for patient progress and needs. Will follow for case management and discharge needs. Next review date: [id number] 

## 2017-11-19 NOTE — Progress Notes (Signed)
Pt. wanting to keep HFNC,([place]), on this evening, RN aware and made pt. aware to notify if needed.

## 2017-11-19 NOTE — Progress Notes (Signed)
PT Cancellation Note  Patient Details Name: Wanda Hanson MRN: [id number] DOB: 14-May-1946   Cancelled Treatment:    Reason Eval/Treat Not Completed: Fatigue/lethargy limiting ability to participate, patient staes that she was going to rest. Will check back another time.[place] PT [phone]    Claretha Cooper 11/19/2017, 2:23 PM

## 2017-11-19 NOTE — Progress Notes (Signed)
[place] PCCM PROGRESS NOTE  Date of Admission: 11/15/2017 Date of Consult: 11/16/2017 Referring Provider: Dr. Carolin Sicks, [facility] Chief Complaint: Short of breath  HPI: 71 yo female presented with cough, fever.  She had outpt course of Zpak but failed to improve.  Found to have PNA on CXR at urgent care center.  Developed progressive hypoxia and transferred to ICU.  Her husband and son had similar symptoms.  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  Past Medical History: CML, Fatty liver, DM, Gout, Hypertension, Depression, HLD, Neuropathy, GERD, ACE inhibitor angioedema, Chronic back pain, Migraines, OSA, Restless legs, Eczema, Seborrheic dermatitis, Rosacea  Subjective: Breathing much better.  Off Bipap.  Still has dry cough.  Feels tired.  Had trouble sleeping.  Vital signs: BP (!) 120/51   Pulse 76   Temp (!) 97.5 F (36.4 C) (Oral)   Resp 20   Ht 5' (1.524 m)   Wt 205 lb 0.4 oz (93 kg)   SpO2 94%   BMI 40.04 kg/m   Intake/Output: I/O last 3 completed shifts: In: 1167.5 [P.O.:240; I.V.:577.5; IV Piggyback:350] Out: 750 [Urine:750]  Physical Exam:  General - pleasant Eyes - pupils reactive ENT - no sinus tenderness, no oral exudate, no LAN Cardiac - regular, no murmur Chest - no wheeze, rales Abd - soft, non tender Ext - no edema Skin - no rashes Neuro - normal strength Psych - normal mood  Discussion: 71 yo female with CAP and acute hypoxic respiratory failure.  She likely started with Corona virus infection complicated by bacterial superinfection.  Continues to improve.  Assessment/Plan:  Acute hypoxic respiratory failure 2nd to CAP. - oxygen to keep SpO2 90 to 95% - Bipap prn during the day >> if she does okay, then can likely d/c 12/28 Bipap and supplemental oxygen - day 5/7 of ABx - change solumedrol to 20 mg q8h - f/u CXR intermittently - add cough medicine  History of OSA. - Bipap qhs  Nausea. - prn zofran  Hx of HTN, HLD. -  continue norvasc, aldactone, lasix, pravachol  Hx of DM, gout. Steroid induced hyperglycemia. - SSI - hold outpt glucotrol - allopurinol  Hx of anxiety/depression, neuropathy, chronic back pain, RLS. - cymbalta, requip  Hx of CML. - hold tasigna >> her husband will check with oncology at [facility] about whether this needs to be resumed now  Deconditioning. - PT assessment  DVT prophylaxis: Lovenox SUP: Protonix Diet: Carb modified Goals of care: Full code  Updated husband at bedside.  Change to SDU status.  Chesley Mires, MD [place] Pulmonary/Critical Care 11/19/2017, 9:21 AM Pager:  [phone] After 3pm call: [phone]  FLOW SHEET  Cultures: Blood 12/23 >> Influenza PCR 12/23 >> negative Respiratory viral panel 12/24 >> Corona virus  Antibiotics: Vancomycin 12/23 >> 12/26 Cefepime 12/23 >>   Studies:  Events: 12/23 Admit 12/24 Transfer to ICU 12/25 Start solumedrol  Lines/tubes:  Consults:  Resolved Problems: Lactic acidosis  Labs: CMP Latest Ref Rng & Units 11/19/2017 11/18/2017 11/17/2017  Glucose 65 - 99 mg/dL 143(H) 167(H) 108(H)  BUN 6 - 20 mg/dL 27(H) 17 14  Creatinine 0.44 - 1.00 mg/dL 0.88 0.67 0.78  Sodium 135 - 145 mmol/L 139 138 137  Potassium 3.5 - 5.1 mmol/L 4.1 4.4 4.7  Chloride 101 - 111 mmol/L 107 109 107  CO2 22 - 32 mmol/L 25 22 23   Calcium 8.9 - 10.3 mg/dL 9.2 8.8(L) 8.6(L)  Total Protein 6.5 - 8.1 g/dL - - -  Total Bilirubin 0.3 - 1.2 mg/dL - - -  Alkaline Phos 38 - 126 U/L - - -  AST 15 - 41 U/L - - -  ALT 14 - 54 U/L - - -    CBC Latest Ref Rng & Units 11/19/2017 11/18/2017 11/17/2017  WBC 4.0 - 10.5 K/uL 18.3(H) 19.6(H) 23.5(H)  Hemoglobin 12.0 - 15.0 g/dL 12.1 11.5(L) 11.8(L)  Hematocrit 36.0 - 46.0 % 37.1 35.2(L) 36.7  Platelets 150 - 400 K/uL 323 310 290    CBG (last 3)  Recent Labs    11/18/17 1654 11/18/17 2104 11/19/17 0723  GLUCAP 112* 143* 132*    Imaging: Dg Chest Port 1 View  Result Date:  11/18/2017 CLINICAL DATA:  Respiratory failure EXAM: PORTABLE CHEST 1 VIEW COMPARISON:  November 17, 2017 FINDINGS: There is persistent patchy airspace consolidation throughout portions of the right upper and lower lobes as well as in the left base. In comparison with 1 day prior, there is increased consolidation left base with similar changes on the right. There is a new small right pleural effusion. There is cardiomegaly with pulmonary venous hypertension. There is aortic atherosclerosis. No adenopathy. No bone lesions. IMPRESSION: Underlying pulmonary vascular congestion. Apparent multifocal pneumonia with stable areas of patchy opacity in the right upper and right lower lobes with increase in consolidation left base. New small right pleural effusion. There is aortic atherosclerosis. Aortic Atherosclerosis (ICD10-I70.0). Electronically Signed   By: Lowella Grip III M.D.   On: 11/18/2017 07:04

## 2017-11-20 LAB — GLUCOSE, CAPILLARY
GLUCOSE-CAPILLARY: 132 mg/dL — AB (ref 65–99)
Glucose-Capillary: 103 mg/dL — ABNORMAL HIGH (ref 65–99)
Glucose-Capillary: 109 mg/dL — ABNORMAL HIGH (ref 65–99)
Glucose-Capillary: 95 mg/dL (ref 65–99)
Glucose-Capillary: 158 mg/dL — ABNORMAL HIGH (ref 65–99)

## 2017-11-20 MED ORDER — METHYLPREDNISOLONE SODIUM SUCC 40 MG IJ SOLR
20.0000 mg | Freq: Two times a day (BID) | INTRAMUSCULAR | Status: DC
  Administered 2017-11-20 – 2017-11-21 (×2): 20 mg via INTRAVENOUS
  Filled 2017-11-20 (×2): qty 1

## 2017-11-20 MED ORDER — NILOTINIB HCL 200 MG PO CAPS
200.0000 mg | ORAL_CAPSULE | Freq: Two times a day (BID) | ORAL | Status: DC
  Administered 2017-11-20 – 2017-11-21 (×3): 200 mg via ORAL

## 2017-11-20 MED ORDER — LORAZEPAM 1 MG PO TABS
1.0000 mg | ORAL_TABLET | Freq: Two times a day (BID) | ORAL | Status: DC | PRN
  Administered 2017-11-20: 1 mg via ORAL
  Filled 2017-11-20: qty 1

## 2017-11-20 MED ORDER — GLIPIZIDE 5 MG PO TABS
5.0000 mg | ORAL_TABLET | Freq: Every day | ORAL | Status: DC
  Administered 2017-11-21: 5 mg via ORAL
  Filled 2017-11-20: qty 1

## 2017-11-20 NOTE — Progress Notes (Signed)
Patient remains on Wanda Hanson with 9 liters oxygen flow. She has decided to remain on the Wanda instead of wearing her home CPAP machine tonight.

## 2017-11-20 NOTE — Progress Notes (Signed)
[place] PCCM PROGRESS NOTE  Date of Admission: 11/15/2017 Date of Consult: 11/16/2017 Referring Provider: Dr. Carolin Sicks, [facility] Chief Complaint: Short of breath  HPI: 71 yo female presented with cough, fever.  She had outpt course of Zpak but failed to improve.  Found to have PNA on CXR at urgent care center.  Developed progressive hypoxia and transferred to ICU.  Her husband and son had similar symptoms.  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  Past Medical History: CML, Fatty liver, DM, Gout, Hypertension, Depression, HLD, Neuropathy, GERD, ACE inhibitor angioedema, Chronic back pain, Migraines, OSA, Restless legs, Eczema, Seborrheic dermatitis, Rosacea  Subjective: Slept better.  Still has cough, but not as much.  Feels her energy and appetite are coming back.  Vital signs: BP 116/74   Pulse 76   Temp 97.8 F (36.6 C) (Oral)   Resp (!) 35   Ht 5' (1.524 m)   Wt 207 lb 7.3 oz (94.1 kg)   SpO2 (!) 82%   BMI 40.52 kg/m   Intake/Output: I/O last 3 completed shifts: In: 2110.5 [P.O.:1200; I.V.:610.5; IV Piggyback:300] Out: 350 [Urine:350]  Physical Exam:  General - pleasant Eyes - pupils reactive ENT - no sinus tenderness, no oral exudate, no LAN Cardiac - regular, no murmur Chest - no wheeze, rales Abd - soft, non tender Ext - no edema Skin - no rashes Neuro - normal strength Psych - normal mood  Discussion: 71 yo female with CAP and acute hypoxic respiratory failure.  She likely started with Corona virus infection complicated by bacterial superinfection.  Continues to improve.  Assessment/Plan:  Acute hypoxic respiratory failure 2nd to CAP. - oxygen to keep SpO2 90 to 95% - assess for home oxygen - day 6/7 Abx - f/u 2 view CXR 12/29 - prn cough medications  History of OSA. - CPAP qhs  Nausea. - prn zofran  Hx of HTN, HLD. - continue norvasc, aldactone, lasix, pravachol  Hx of DM, gout. Steroid induced hyperglycemia. - SSI - resume  glucotrol - allopurinol  Hx of anxiety/depression, neuropathy, chronic back pain, RLS. - cymbalta, requip  Hx of CML. - resume tasigna 12/28  Deconditioning. - f/u with PT  DVT prophylaxis: Lovenox SUP: Protonix Diet: Carb modified Goals of care: Full code  Updated husband at bedside.  Transfer to med-surg bed.  Might be ready for d/c home in next 24 to 48 hrs.  Chesley Mires, MD [place] Pulmonary/Critical Care 11/20/2017, 11:34 AM Pager:  [phone] After 3pm call: [phone]  FLOW SHEET  Cultures: Blood 12/23 >> Influenza PCR 12/23 >> negative Respiratory viral panel 12/24 >> Corona virus  Antibiotics: Vancomycin 12/23 >> 12/26 Cefepime 12/23 >>   Studies:  Events: 12/23 Admit 12/24 Transfer to ICU 12/25 Start solumedrol 12/28 Transfer to floor bed  Lines/tubes:  Consults:  Resolved Problems: Lactic acidosis  Labs: CMP Latest Ref Rng & Units 11/19/2017 11/18/2017 11/17/2017  Glucose 65 - 99 mg/dL 143(H) 167(H) 108(H)  BUN 6 - 20 mg/dL 27(H) 17 14  Creatinine 0.44 - 1.00 mg/dL 0.88 0.67 0.78  Sodium 135 - 145 mmol/L 139 138 137  Potassium 3.5 - 5.1 mmol/L 4.1 4.4 4.7  Chloride 101 - 111 mmol/L 107 109 107  CO2 22 - 32 mmol/L 25 22 23   Calcium 8.9 - 10.3 mg/dL 9.2 8.8(L) 8.6(L)  Total Protein 6.5 - 8.1 g/dL - - -  Total Bilirubin 0.3 - 1.2 mg/dL - - -  Alkaline Phos 38 - 126 U/L - - -  AST 15 - 41 U/L - - -  ALT 14 - 54 U/L - - -    CBC Latest Ref Rng & Units 11/19/2017 11/18/2017 11/17/2017  WBC 4.0 - 10.5 K/uL 18.3(H) 19.6(H) 23.5(H)  Hemoglobin 12.0 - 15.0 g/dL 12.1 11.5(L) 11.8(L)  Hematocrit 36.0 - 46.0 % 37.1 35.2(L) 36.7  Platelets 150 - 400 K/uL 323 310 290    CBG (last 3)  Recent Labs    11/19/17 1555 11/19/17 2111 11/20/17 0746  GLUCAP 160* 109* 103*    Imaging: No results found.

## 2017-11-20 NOTE — Progress Notes (Signed)
PT Cancellation Note  Patient Details Name: Wanda Hanson MRN: [id number] DOB: [birth year]   Cancelled Treatment:    Reason Eval/Treat Not Completed: Patient declined, no reason specified, x 2 attempts today.    Claretha Cooper 11/20/2017, 4:50 PM Tresa Endo PT [phone]

## 2017-11-21 ENCOUNTER — Inpatient Hospital Stay (HOSPITAL_COMMUNITY): Payer: Medicare HMO

## 2017-11-21 DIAGNOSIS — J1289 Other viral pneumonia: Secondary | ICD-10-CM

## 2017-11-21 LAB — CULTURE, BLOOD (ROUTINE X 2)
CULTURE: NO GROWTH
Culture: NO GROWTH
SPECIAL REQUESTS: ADEQUATE

## 2017-11-21 LAB — BASIC METABOLIC PANEL
ANION GAP: 8 (ref 5–15)
BUN: 25 mg/dL — AB (ref 6–20)
CO2: 24 mmol/L (ref 22–32)
Calcium: 8.4 mg/dL — ABNORMAL LOW (ref 8.9–10.3)
Chloride: 107 mmol/L (ref 101–111)
Creatinine, Ser: 0.72 mg/dL (ref 0.44–1.00)
GFR calc Af Amer: >60 mL/min (ref >60)
GLUCOSE: 126 mg/dL — AB (ref 65–99)
POTASSIUM: 4 mmol/L (ref 3.5–5.1)
Sodium: 139 mmol/L (ref 135–145)

## 2017-11-21 LAB — GLUCOSE, CAPILLARY
Glucose-Capillary: 113 mg/dL — ABNORMAL HIGH (ref 65–99)
Glucose-Capillary: 70 mg/dL (ref 65–99)
Glucose-Capillary: 91 mg/dL (ref 65–99)

## 2017-11-21 LAB — CBC
HEMATOCRIT: 35.5 % — AB (ref 36.0–46.0)
HEMOGLOBIN: 11.3 g/dL — AB (ref 12.0–15.0)
MCH: 27.1 pg (ref 26.0–34.0)
MCHC: 31.8 g/dL (ref 30.0–36.0)
MCV: 85.1 fL (ref 78.0–100.0)
Platelets: 302 10*3/uL (ref 150–400)
RBC: 4.17 MIL/uL (ref 3.87–5.11)
RDW: 15.5 % (ref 11.5–15.5)
WBC: 16.4 10*3/uL — ABNORMAL HIGH (ref 4.0–10.5)

## 2017-11-21 MED ORDER — BENZONATATE 200 MG PO CAPS: 200.0000 mg | ORAL_CAPSULE | Freq: Three times a day (TID) | ORAL | 0 refills | Status: DC | PRN

## 2017-11-21 MED ORDER — PREDNISONE 5 MG PO TABS
30.0000 mg | ORAL_TABLET | Freq: Every day | ORAL | Status: DC
  Administered 2017-11-21: 13:00:00 30 mg via ORAL
  Filled 2017-11-21: qty 1

## 2017-11-21 MED ORDER — PREDNISONE 10 MG PO TABS: ORAL_TABLET | ORAL | 0 refills | Status: DC

## 2017-11-21 MED ORDER — DIPHENHYDRAMINE HCL 50 MG PO CAPS: 50.0000 mg | ORAL_CAPSULE | Freq: Every evening | ORAL | 0 refills | Status: DC | PRN

## 2017-11-21 MED ORDER — GUAIFENESIN ER 600 MG PO TB12: 600.0000 mg | ORAL_TABLET | Freq: Two times a day (BID) | ORAL | Status: DC | PRN

## 2017-11-21 MED ORDER — FUROSEMIDE 20 MG PO TABS
20.0000 mg | ORAL_TABLET | Freq: Every day | ORAL | Status: DC
  Administered 2017-11-21: 20 mg via ORAL
  Filled 2017-11-21: qty 1

## 2017-11-21 NOTE — Progress Notes (Signed)
SATURATION QUALIFICATIONS: (This note is used to comply with regulatory documentation for home oxygen)  Patient Saturations on Room Air at Rest =96%  Patient Saturations on Room Air while Ambulating =88   Patient Saturations on 2iters of oxygen while Ambulating = 94   Please briefly explain why patient needs home oxygen:o2 sat dropped to 88%

## 2017-11-21 NOTE — Discharge Summary (Signed)
Physician Discharge Summary       Patient Name: Wanda Hanson DOB: [birth year] MRN: [id number]  Admission Date: 11/15/2017 Discharge Date: 11/21/17  Discharge Diagnoses: Corona virus pneumonitis Community acquired pneumonia Acute hypoxic respiratory failure Nausea Steroid induced hyperglycemia Muscular deconditioning Obstructive sleep apnea Hypertension Hyperlipidemia Diabetes mellitus type II Anxiety with depression Peripheral neuropathy Chronic back pain Restless leg syndrome Chronic myeloid leukemia in remission Lactic acidosis  Hospital Course: 71 yo female presented with cough, fever.  She had outpt course of Zpak but failed to improve.  Found to have PNA on CXR at urgent care center.  Developed progressive hypoxia and transferred to ICU.  Her husband and son had similar symptoms.  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  Respiratory viral panel was positive for Corona virus.  She had elevated procalcitonin.  She was treated with Bipap, supplemental oxygen, antibiotics (vancomycin and cefepime), and solumedrol. She improved clinically and was transitioned off Bipap.  She completed course of antibiotics.  She was still was needing supplemental oxygen.  Past Medical History: She  has a past medical history of Chronic back pain, CML (chronic myelocytic leukemia) ([place]) (2015), Depression, Diabetes mellitus ([place]), Eczema, Fatty liver, GERD (gastroesophageal reflux disease), Gout, Hyperlipidemia, Hypertension, Migraine, OSA (obstructive sleep apnea), Peripheral neuropathy, Restless leg syndrome, Rosacea, and Seborrheic dermatitis.  Past Surgical History: She  has a past surgical history that includes Back surgery; Abdominal hysterectomy; and Joint replacement.  Family History: Her family history includes Lung cancer in her father.  Social History: She  reports that  has never smoked. she has never used smokeless tobacco. She reports that she does not drink  alcohol or use drugs.  Vital Signs: BP 120/81 (BP Location: Left Arm)   Pulse 87   Temp 98.2 F (36.8 C) (Oral)   Resp 16   Ht 5' (1.524 m)   Wt 203 lb 12.8 oz (92.4 kg)   SpO2 98%   BMI 39.80 kg/m   Physical Exam: General - pleasant Eyes - pupils reactive ENT - no sinus tenderness, no oral exudate, no LAN Cardiac - regular, no murmur Chest - no wheeze, rales Abd - soft, non tender Ext - no edema Skin - no rashes Neuro - normal strength Psych - normal mood  Labs: CBC Recent Labs    11/19/17 0326 11/21/17 0405  WBC 18.3* 16.4*  HGB 12.1 11.3*  HCT 37.1 35.5*  PLT 323 302   BMET Recent Labs    11/19/17 0326 11/21/17 0405  NA 139 139  K 4.1 4.0  CL 107 107  CO2 25 24  BUN 27* 25*  CREATININE 0.88 0.72  GLUCOSE 143* 126*    Electrolytes Recent Labs    11/19/17 0326 11/21/17 0405  CALCIUM 9.2 8.4*    Glucose Recent Labs    11/20/17 1155 11/20/17 1623 11/20/17 1724 11/20/17 2115 11/21/17 0747 11/21/17 1120  GLUCAP 132* 109* 95 158* 113* 70    Imaging Dg Chest 2 View  Result Date: 11/21/2017 CLINICAL DATA:  Pneumonia EXAM: CHEST  2 VIEW COMPARISON:  11/18/2017 FINDINGS: Heart is upper normal in size. Patchy airspace disease in the right upper and lower lobes has increased. Small left pleural effusion is increased. Vascular congestion without interstitial edema. IMPRESSION: Worsening patchy airspace disease in the right lung. Followup PA and lateral chest X-ray is recommended in 3-4 weeks following trial of antibiotic therapy to ensure resolution and exclude underlying malignancy. Increasing left pleural effusion. Electronically Signed   By:  Marybelle Killings M.D.   On: 11/21/2017 11:18    Discharge Medications: Allergies as of 11/21/2017      Reactions   Ace Inhibitors Swelling   Asa [aspirin]    Ulcer   Latex    Morphine And Related    SOB   Tylenol [acetaminophen]    Ulcer      Medication List    STOP taking these medications     azithromycin 250 MG tablet Commonly known as:  ZITHROMAX     TAKE these medications   allopurinol 300 MG tablet Commonly known as:  ZYLOPRIM Take 300 mg by mouth daily.   amLODipine 5 MG tablet Commonly known as:  NORVASC Take 5 mg by mouth 2 (two) times daily.   benzonatate 200 MG capsule Commonly known as:  TESSALON Take 1 capsule (200 mg total) by mouth 3 (three) times daily as needed for cough.   diclofenac sodium 1 % Gel Commonly known as:  VOLTAREN Apply 1 application topically daily as needed for pain. Right great toe   diphenhydrAMINE 50 MG capsule Commonly known as:  BENADRYL Take 1 capsule (50 mg total) by mouth at bedtime as needed for sleep.   DULoxetine 60 MG capsule Commonly known as:  CYMBALTA Take 60 mg by mouth daily.   fluticasone 50 MCG/ACT nasal spray Commonly known as:  FLONASE Place 1 spray into both nostrils daily.   furosemide 20 MG tablet Commonly known as:  LASIX Take 20 mg by mouth.   glipiZIDE 5 MG tablet Commonly known as:  GLUCOTROL Take 5 mg by mouth daily before breakfast.   guaiFENesin 600 MG 12 hr tablet Commonly known as:  MUCINEX Take 1 tablet (600 mg total) by mouth 2 (two) times daily as needed for cough or to loosen phlegm.   iron polysaccharides 150 MG capsule Commonly known as:  NIFEREX Take 150 mg by mouth daily.   LORazepam 0.5 MG tablet Commonly known as:  ATIVAN Take 1 mg by mouth 3 times/day as needed-between meals & bedtime for sleep.   meclizine 12.5 MG tablet Commonly known as:  ANTIVERT Take 12.5 mg by mouth 3 (three) times daily as needed for dizziness.   multivitamin with minerals tablet Take 1 tablet by mouth daily.   nilotinib 200 MG capsule Commonly known as:  TASIGNA Take 200 mg by mouth every 12 (twelve) hours. Give on an empty stomach 1 hour before or 2 hours after meals.   omeprazole 40 MG capsule Commonly known as:  PRILOSEC Take 40 mg by mouth daily.   potassium chloride 20 MEQ  packet Commonly known as:  KLOR-CON Take by mouth 2 (two) times daily.   pravastatin 10 MG tablet Commonly known as:  PRAVACHOL Take 10 mg by mouth daily.   predniSONE 10 MG tablet Commonly known as:  DELTASONE 3 pills daily for 2 days, 2 pills daily for 2 days, 1 pill daily for 2 days   pregabalin 50 MG capsule Commonly known as:  LYRICA Take 100 mg by mouth at bedtime.   rOPINIRole 1 MG tablet Commonly known as:  REQUIP Take 4 mg by mouth at bedtime.   spironolactone 25 MG tablet Commonly known as:  ALDACTONE Take 25 mg by mouth daily.   tiZANidine 4 MG capsule Commonly known as:  ZANAFLEX Take 4 mg by mouth at bedtime.   traMADol 50 MG tablet Commonly known as:  ULTRAM Take by mouth every 6 (six) hours as needed.  Diet: Heart healthy, carbohydrate modified  Activity: As tolerated  Medical supplies: 2 liters supplemental oxygen 24/7  Follow up appointments: Dr. Chesley Mires in 3 to 4 weeks.  She will call office to make an appointment.  Time spent with discharge planning 39 minutes.  Chesley Mires, MD [facility] Pulmonary/Critical Care 11/21/2017, 3:05 PM Pager:  [phone] After 3pm call: [phone]

## 2017-11-21 NOTE — Progress Notes (Signed)
Discharge instructions given , went home with home oxygen.

## 2017-11-21 NOTE — Progress Notes (Signed)
Received referral to assist pt with home O2. Contacted Jermaine at [facility] for referral.

## 2017-12-29 ENCOUNTER — Inpatient Hospital Stay: Payer: Medicare HMO | Admitting: Pulmonary Disease

## 2017-12-30 ENCOUNTER — Ambulatory Visit: Payer: Medicare HMO | Admitting: Acute Care

## 2017-12-30 ENCOUNTER — Encounter: Payer: Self-pay | Admitting: Acute Care

## 2017-12-30 ENCOUNTER — Telehealth: Payer: Self-pay | Admitting: Acute Care

## 2017-12-30 VITALS — BP 118/62 | HR 111 | Wt 196.4 lb

## 2017-12-30 DIAGNOSIS — Z9989 Dependence on other enabling machines and devices: Secondary | ICD-10-CM | POA: Diagnosis not present

## 2017-12-30 DIAGNOSIS — A419 Sepsis, unspecified organism: Secondary | ICD-10-CM | POA: Diagnosis not present

## 2017-12-30 DIAGNOSIS — G4733 Obstructive sleep apnea (adult) (pediatric): Secondary | ICD-10-CM | POA: Diagnosis not present

## 2017-12-30 DIAGNOSIS — J189 Pneumonia, unspecified organism: Secondary | ICD-10-CM | POA: Diagnosis not present

## 2017-12-30 NOTE — Patient Instructions (Addendum)
It is nice to meet you today. I am glad you are doing well.  CXR in 1 month for resolution of pneumonia. We will call you with results Stay active as able  Follow up with Dr. Halford Chessman in 3 months Please contact office for sooner follow up if symptoms do not improve or worsen or seek emergency care

## 2017-12-30 NOTE — Progress Notes (Signed)
History of Present Illness Wanda Hanson is a 72 y.o. female never smoker with OSA on CPAP, CML , maintained on tasigna ( [facility]ed [facility] recently hospitalized for pneumonia with progressive hypoxia. She was seen as inpatient by Dr. Halford Chessman.    12/30/2017 Pt. Presents for hospital follow up:  Hospital Course: 11/15/2017-11/21/2017 72 yo female presented with cough, fever. She had outpt course of Zpak but failed to improve. Found to have PNA ( CAP)  on CXR at urgent care center. Developed progressive hypoxia and transferred to ICU. Her husband and son had similar symptoms. She is followed at [facility] for CML that is in remission.  Respiratory viral panel was positive for Corona virus.  She had elevated procalcitonin.  She was treated with Bipap, supplemental oxygen, antibiotics (vancomycin and cefepime), and solumedrol. She improved clinically and was transitioned off Bipap.  She completed course of antibiotics.  She was still was needing supplemental oxygen at discharge.  Pt. Presents for follow up  stating she is doing well. CXR was done 12/16/2017 at [facility] which shows resolving pneumonia. She states she did have a sore throat with some change in her secretions afew days ago,  and she states it cleared up.She states she only has an occasional cough.She has no nasal tenderness now. She only has clear secretions.She completed her prednisone taper. She has regular follow up of her CML through [facility].  Patient is currently being treated with CPAP for OSA.  Patient states she has not had follow-up for this in quite some time.  She is interested in being monitored by Dr. Halford Chessman.  We will set her up for a consultation for her OSA and management of her CPAP therapy.   Test Results:  Cultures: Blood 12/23 >> Influenza PCR 12/23 >> negative Respiratory viral panel 12/24 >> Corona virus  CXR 12/16/2017>>  Interval resolution of multifocal airspace opacities consistent with resolving  pneumonia.  CXR 11/21/2017>>  Worsening patchy airspace disease in the right lung. Followup PA and lateral chest X-ray is recommended in 3-4 weeks following trial of antibiotic therapy to ensure resolution and exclude underlying malignancy  CBC Latest Ref Rng & Units 11/21/2017 11/19/2017 11/18/2017  WBC 4.0 - 10.5 K/uL 16.4(H) 18.3(H) 19.6(H)  Hemoglobin 12.0 - 15.0 g/dL 11.3(L) 12.1 11.5(L)  Hematocrit 36.0 - 46.0 % 35.5(L) 37.1 35.2(L)  Platelets 150 - 400 K/uL 302 323 310    BMP Latest Ref Rng & Units 11/21/2017 11/19/2017 11/18/2017  Glucose 65 - 99 mg/dL 126(H) 143(H) 167(H)  BUN 6 - 20 mg/dL 25(H) 27(H) 17  Creatinine 0.44 - 1.00 mg/dL 0.72 0.88 0.67  Sodium 135 - 145 mmol/L 139 139 138  Potassium 3.5 - 5.1 mmol/L 4.0 4.1 4.4  Chloride 101 - 111 mmol/L 107 107 109  CO2 22 - 32 mmol/L 24 25 22   Calcium 8.9 - 10.3 mg/dL 8.4(L) 9.2 8.8(L)     Past medical hx Past Medical History:  Diagnosis Date  . Chronic back pain   . CML (chronic myelocytic leukemia) ([place]) 2015  . Depression   . Diabetes mellitus ([place])   . Eczema   . Fatty liver   . GERD (gastroesophageal reflux disease)   . Gout   . Hyperlipidemia   . Hypertension   . Migraine   . OSA (obstructive sleep apnea)   . Peripheral neuropathy   . Restless leg syndrome   . Rosacea   . Seborrheic dermatitis      Social History   Tobacco  Use  . Smoking status: Never Smoker  . Smokeless tobacco: Never Used  Substance Use Topics  . Alcohol use: No    Frequency: Never  . Drug use: No    Ms.Bearse reports that  has never smoked. she has never used smokeless tobacco. She reports that she does not drink alcohol or use drugs.  Tobacco Cessation: Patient is a never smoker  Past surgical hx, Family hx, Social hx all reviewed.  Current Outpatient Medications on File Prior to Visit  Medication Sig  . allopurinol (ZYLOPRIM) 300 MG tablet Take 300 mg by mouth daily.  Marland Kitchen amLODipine (NORVASC) 5 MG tablet Take 5 mg  by mouth 2 (two) times daily.  . benzonatate (TESSALON) 200 MG capsule Take 1 capsule (200 mg total) by mouth 3 (three) times daily as needed for cough.  . diclofenac sodium (VOLTAREN) 1 % GEL Apply 1 application topically daily as needed for pain. Right great toe  . diphenhydrAMINE (BENADRYL) 50 MG capsule Take 1 capsule (50 mg total) by mouth at bedtime as needed for sleep.  . DULoxetine (CYMBALTA) 60 MG capsule Take 60 mg by mouth daily.  . fluticasone (FLONASE) 50 MCG/ACT nasal spray Place 1 spray into both nostrils daily.  . furosemide (LASIX) 20 MG tablet Take 20 mg by mouth.  Marland Kitchen glipiZIDE (GLUCOTROL) 5 MG tablet Take 5 mg by mouth daily before breakfast.  . guaiFENesin (MUCINEX) 600 MG 12 hr tablet Take 1 tablet (600 mg total) by mouth 2 (two) times daily as needed for cough or to loosen phlegm.  . iron polysaccharides (NIFEREX) 150 MG capsule Take 150 mg by mouth daily.  Marland Kitchen LORazepam (ATIVAN) 0.5 MG tablet Take 1 mg by mouth 3 times/day as needed-between meals & bedtime for sleep.   . meclizine (ANTIVERT) 12.5 MG tablet Take 12.5 mg by mouth 3 (three) times daily as needed for dizziness.  . Multiple Vitamins-Minerals (MULTIVITAMIN WITH MINERALS) tablet Take 1 tablet by mouth daily.  . nilotinib (TASIGNA) 200 MG capsule Take 200 mg by mouth every 12 (twelve) hours. Give on an empty stomach 1 hour before or 2 hours after meals.  Marland Kitchen omeprazole (PRILOSEC) 40 MG capsule Take 40 mg by mouth daily.  . potassium chloride (KLOR-CON) 20 MEQ packet Take by mouth 2 (two) times daily.  . pravastatin (PRAVACHOL) 10 MG tablet Take 10 mg by mouth daily.  . predniSONE (DELTASONE) 10 MG tablet 3 pills daily for 2 days, 2 pills daily for 2 days, 1 pill daily for 2 days  . pregabalin (LYRICA) 50 MG capsule Take 100 mg by mouth at bedtime.   Marland Kitchen rOPINIRole (REQUIP) 1 MG tablet Take 4 mg by mouth at bedtime.   Marland Kitchen tiZANidine (ZANAFLEX) 4 MG capsule Take 4 mg by mouth at bedtime.   . traMADol (ULTRAM) 50 MG tablet  Take by mouth every 6 (six) hours as needed.  Marland Kitchen spironolactone (ALDACTONE) 25 MG tablet Take 25 mg by mouth daily.   No current facility-administered medications on file prior to visit.      Allergies  Allergen Reactions  . Ace Inhibitors Swelling  . Asa [Aspirin]     Ulcer   . Latex   . Morphine And Related     SOB   . Tylenol [Acetaminophen]     Ulcer     Review Of Systems:  Constitutional:   No  weight loss, night sweats,  Fevers, chills, fatigue, or  lassitude.  HEENT:   No headaches,  Difficulty swallowing,  Tooth/dental problems, or  Sore throat,                No sneezing, itching, ear ache, nasal congestion, post nasal drip,   CV:  No chest pain,  Orthopnea, PND, swelling in lower extremities, anasarca, dizziness, palpitations, syncope.   GI  No heartburn, indigestion, abdominal pain, nausea, vomiting, diarrhea, change in bowel habits, loss of appetite, bloody stools.   Resp: No shortness of breath with exertion or at rest.  No excess mucus, no productive cough,  No non-productive cough,  No coughing up of blood.  No change in color of mucus.  No wheezing.  No chest wall deformity  Skin: no rash or lesions.  GU: no dysuria, change in color of urine, no urgency or frequency.  No flank pain, no hematuria   MS:  No joint pain or swelling.  No decreased range of motion.  No back pain.  Psych:  No change in mood or affect. No depression or anxiety.  No memory loss.   Vital Signs BP 118/62 (BP Location: Left Arm, Cuff Size: Normal)   Pulse (!) 111   Wt 196 lb 6.4 oz (89.1 kg)   SpO2 93%   BMI 38.36 kg/m    Physical Exam:  General- No distress,  A&Ox3, pleasant ENT: No sinus tenderness, TM clear, pale nasal mucosa, no oral exudate,no post nasal drip, no LAN Cardiac: S1, S2, regular rate and rhythm, no murmur Chest: No wheeze/ rales/ dullness; no accessory muscle use, no nasal flaring, no sternal retractions, slightly diminished per bases Abd.: Soft  Non-tender, nondistended, bowel sounds positive, obese Ext: No clubbing cyanosis, trace bilateral lower extremity edema Neuro:  normal strength, moving all extremities x4, alert and oriented x3, appropriate Skin: No rashes, warm and dry Psych: normal mood and behavior   Assessment/Plan  Sepsis due to pneumonia [facility]) Resolving Asymptomatic Chest x-ray done at outside hospital indicates interval resolution of multifocal airspace opacities consistent with resolving pneumonia Plan CXR in 1 month for resolution of pneumonia. We will call you with results Stay active as able  Follow up with Dr. Halford Chessman in 3 months Please contact office for sooner follow up if symptoms do not improve or worsen or seek emergency car  OSA on CPAP Patient is currently on CPAP for OSA This is currently not being monitored for effectiveness of therapy Plan Consultation with Dr. Halford Chessman for OSA treatment and management    Magdalen Spatz, NP 12/30/2017  5:26 PM

## 2017-12-30 NOTE — Assessment & Plan Note (Signed)
Resolving Asymptomatic Chest x-ray done at outside hospital indicates interval resolution of multifocal airspace opacities consistent with resolving pneumonia Plan CXR in 1 month for resolution of pneumonia. We will call you with results Stay active as able  Follow up with Dr. Halford Chessman in 3 months Please contact office for sooner follow up if symptoms do not improve or worsen or seek emergency car

## 2017-12-30 NOTE — Telephone Encounter (Signed)
Tamer this patient needs to be set up for an obstructive sleep apnea consult with Dr. Halford Chessman.  Please schedule and call her with the date and time. She is currently wearing CPAP, but she states no one is monitoring it.  I have asked her to bring in the same card from the back of her machine when she comes in for consultation.  Thanks so much

## 2017-12-30 NOTE — Assessment & Plan Note (Signed)
Patient is currently on CPAP for OSA This is currently not being monitored for effectiveness of therapy Plan Consultation with Dr. Halford Chessman for OSA treatment and management

## 2017-12-31 NOTE — Telephone Encounter (Signed)
Called and spoke with patient regarding appt with [facility] for OSA Scheduled appt for 01-08-18 for new consult Pt has cpap with [facility] and O2 with [facility] Pt verbalized that O2 was d/c in Jan 2019; [facility] picked it up 2 weeks ago Nothing further needed at this time

## 2018-01-08 ENCOUNTER — Encounter: Payer: Self-pay | Admitting: Pulmonary Disease

## 2018-01-08 ENCOUNTER — Ambulatory Visit: Payer: Medicare HMO | Admitting: Pulmonary Disease

## 2018-01-08 VITALS — BP 128/76 | HR 102 | Ht 60.0 in | Wt 196.6 lb

## 2018-01-08 DIAGNOSIS — G4733 Obstructive sleep apnea (adult) (pediatric): Secondary | ICD-10-CM | POA: Diagnosis not present

## 2018-01-08 NOTE — Progress Notes (Signed)
[facility]  Chief Complaint  Patient presents with  . sleep consult    Vital signs: BP 128/76 (BP Location: Left Arm, Cuff Size: Normal)   Pulse (!) 102   Ht 5' (1.524 m)   Wt 196 lb 9.6 oz (89.2 kg)   SpO2 93%   BMI 38.40 kg/m   History of Present Illness: Wanda Hanson is a 72 y.o. female with obstructive sleep apnea.  CXR from 12/16/17 showed resolution of pneumonia.  Her breathing is much better.  She is no longer using supplemental oxygen.  She is not having cough, wheeze, sputum, or chest pain.  She had sleep study years ago at [facility]  She used [facility] previously, but hasn't used a DME in years.  She didn't know that insurance would cover her supplies.    Epworth score is 12 out of 24.  Physical Exam:  General - pleasant Eyes - pupils reactive ENT - no sinus tenderness, no oral exudate, no LAN, MP 3 Cardiac - regular, no murmur Chest - no wheeze, rales Abd - soft, non tender Ext - no edema Skin - no rashes Neuro - normal strength Psych - normal mood  Discussion: She has history of sleep apnea.  She has been on CPAP and is in need of new machine and supplies.  She otherwise has been compliant with CPAP.  Assessment/Plan:  Obstructive sleep apnea. - will arrange for home sleep study - will then arrange for new auto CPAP machine and new supplies - she would like to get set up with [facility] for her DME  History of pneumonia. - resolved   Patient Instructions  Will arrange for home sleep study Will call to arrange for follow up after sleep study reviewed    Chesley Mires, MD [place] 01/08/2018, 2:58 PM Pager:  [phone]  Flow Sheet  Sleep tests: Auto CPAP 10/10/17 to 01/07/18 >> used on 78 of 90 nights with average 7 hrs 5 min.  Average AHI 0.5 with median CPAP 9 and 95 th percentile CPAP 11 cm H2O.  Review of Systems: Constitutional: Negative for fever and unexpected weight  change.  HENT: Positive for dental problem and sinus pressure. Negative for congestion, ear pain, nosebleeds, postnasal drip, rhinorrhea, sneezing, sore throat and trouble swallowing.   Eyes: Negative for redness and itching.  Respiratory: Negative for cough, chest tightness, shortness of breath and wheezing.   Cardiovascular: Positive for leg swelling. Negative for palpitations.  Gastrointestinal: Negative for nausea and vomiting.  Genitourinary: Negative for dysuria.  Musculoskeletal: Positive for joint swelling.  Skin: Negative for rash.  Allergic/Immunologic: Positive for environmental allergies. Negative for food allergies and immunocompromised state.  Neurological: Positive for headaches.  Hematological: Does not bruise/bleed easily.  Psychiatric/Behavioral: Negative for dysphoric mood. The patient is nervous/anxious.    Past Medical History: She  has a past medical history of Chronic back pain, CML (chronic myelocytic leukemia) ([place]) (2015), Depression, Diabetes mellitus ([place]), Eczema, Fatty liver, GERD (gastroesophageal reflux disease), Gout, Hyperlipidemia, Hypertension, Migraine, OSA (obstructive sleep apnea), Peripheral neuropathy, Restless leg syndrome, Rosacea, and Seborrheic dermatitis.  Past Surgical History: She  has a past surgical history that includes Back surgery; Abdominal hysterectomy; and Joint replacement.  Family History: Her family history includes Lung cancer in her father.  Social History: She  reports that  has never smoked. she has never used smokeless tobacco. She reports that she does not drink alcohol or use drugs.  Medications: Allergies as of  01/08/2018      Reactions   Ace Inhibitors Swelling   Asa [aspirin]    Ulcer   Latex    Morphine And Related    SOB   Tylenol [acetaminophen]    Ulcer      Medication List        Accurate as of 01/08/18  2:58 PM. Always use your most recent med list.          allopurinol 300 MG tablet Commonly  known as:  ZYLOPRIM Take 300 mg by mouth daily.   amLODipine 5 MG tablet Commonly known as:  NORVASC Take 5 mg by mouth 2 (two) times daily.   benzonatate 200 MG capsule Commonly known as:  TESSALON Take 1 capsule (200 mg total) by mouth 3 (three) times daily as needed for cough.   diclofenac sodium 1 % Gel Commonly known as:  VOLTAREN Apply 1 application topically daily as needed for pain. Right great toe   diphenhydrAMINE 50 MG capsule Commonly known as:  BENADRYL Take 1 capsule (50 mg total) by mouth at bedtime as needed for sleep.   DULoxetine 60 MG capsule Commonly known as:  CYMBALTA Take 60 mg by mouth daily.   fluticasone 50 MCG/ACT nasal spray Commonly known as:  FLONASE Place 1 spray into both nostrils daily.   furosemide 20 MG tablet Commonly known as:  LASIX Take 20 mg by mouth.   glipiZIDE 5 MG tablet Commonly known as:  GLUCOTROL Take 5 mg by mouth daily before breakfast.   guaiFENesin 600 MG 12 hr tablet Commonly known as:  MUCINEX Take 1 tablet (600 mg total) by mouth 2 (two) times daily as needed for cough or to loosen phlegm.   iron polysaccharides 150 MG capsule Commonly known as:  NIFEREX Take 150 mg by mouth daily.   LORazepam 0.5 MG tablet Commonly known as:  ATIVAN Take 1 mg by mouth 3 times/day as needed-between meals & bedtime for sleep.   meclizine 12.5 MG tablet Commonly known as:  ANTIVERT Take 12.5 mg by mouth 3 (three) times daily as needed for dizziness.   multivitamin with minerals tablet Take 1 tablet by mouth daily.   nilotinib 200 MG capsule Commonly known as:  TASIGNA Take 200 mg by mouth every 12 (twelve) hours. Give on an empty stomach 1 hour before or 2 hours after meals.   omeprazole 40 MG capsule Commonly known as:  PRILOSEC Take 40 mg by mouth daily.   potassium chloride 20 MEQ packet Commonly known as:  KLOR-CON Take by mouth 2 (two) times daily.   pravastatin 10 MG tablet Commonly known as:  PRAVACHOL Take  10 mg by mouth daily.   pregabalin 50 MG capsule Commonly known as:  LYRICA Take 100 mg by mouth at bedtime.   rOPINIRole 1 MG tablet Commonly known as:  REQUIP Take 4 mg by mouth at bedtime.   spironolactone 25 MG tablet Commonly known as:  ALDACTONE Take 25 mg by mouth daily.   tiZANidine 4 MG capsule Commonly known as:  ZANAFLEX Take 4 mg by mouth at bedtime.   traMADol 50 MG tablet Commonly known as:  ULTRAM Take by mouth every 6 (six) hours as needed.

## 2018-01-08 NOTE — Progress Notes (Signed)
   Subjective:    Patient ID: Wanda Hanson, female    DOB: March 13, 1946, 72 y.o.   MRN: [id number]  HPI    Review of Systems  Constitutional: Negative for fever and unexpected weight change.  HENT: Positive for dental problem and sinus pressure. Negative for congestion, ear pain, nosebleeds, postnasal drip, rhinorrhea, sneezing, sore throat and trouble swallowing.   Eyes: Negative for redness and itching.  Respiratory: Negative for cough, chest tightness, shortness of breath and wheezing.   Cardiovascular: Positive for leg swelling. Negative for palpitations.  Gastrointestinal: Negative for nausea and vomiting.  Genitourinary: Negative for dysuria.  Musculoskeletal: Positive for joint swelling.  Skin: Negative for rash.  Allergic/Immunologic: Positive for environmental allergies. Negative for food allergies and immunocompromised state.  Neurological: Positive for headaches.  Hematological: Does not bruise/bleed easily.  Psychiatric/Behavioral: Negative for dysphoric mood. The patient is nervous/anxious.        Objective:   Physical Exam        Assessment & Plan:

## 2018-01-08 NOTE — Patient Instructions (Signed)
Will arrange for home sleep study Will call to arrange for follow up after sleep study reviewed  

## 2018-01-31 DIAGNOSIS — G4733 Obstructive sleep apnea (adult) (pediatric): Secondary | ICD-10-CM | POA: Diagnosis not present

## 2018-02-01 ENCOUNTER — Other Ambulatory Visit: Payer: Self-pay | Admitting: *Deleted

## 2018-02-01 DIAGNOSIS — G4733 Obstructive sleep apnea (adult) (pediatric): Secondary | ICD-10-CM

## 2018-02-02 ENCOUNTER — Telehealth: Payer: Self-pay | Admitting: Pulmonary Disease

## 2018-02-02 DIAGNOSIS — G4733 Obstructive sleep apnea (adult) (pediatric): Secondary | ICD-10-CM | POA: Diagnosis not present

## 2018-02-02 NOTE — Telephone Encounter (Signed)
HST 01/31/18 >> AHI 7.4, SaO2 low 80%.   Will have my nurse inform pt that sleep study shows mild sleep apnea.  Options are 1) CPAP now, 2) ROV first.  If She is agreeable to CPAP, then please send order for auto CPAP range 5 to 15 cm H2O with heated humidity and mask of choice.  Have download sent 1 month after starting CPAP and set up ROV 2 months after starting CPAP.  ROV can be with me or NP.

## 2018-02-03 NOTE — Telephone Encounter (Signed)
Called and spoke with patient today regarding results and recommendations.  Informed the patient of results today. Pt verbalized understanding Pt would like to talk this over with her husband and call back with decision Pt is not certain they would like to start on CPAP Will follow up in one week

## 2018-02-11 NOTE — Telephone Encounter (Signed)
Called and spoke to pt. Pt states this is not a good time to talk and she will call us back tomorrow to discuss. Will await call back.

## 2018-02-12 NOTE — Telephone Encounter (Signed)
Called and spoke with patient, patient states that she would like to come in first before starting on the machine to talk about it and other options. I advised patient I could schedule her as soon as next week with [facility]. She is ok with that and scheduled for 3.29

## 2018-02-19 ENCOUNTER — Ambulatory Visit: Payer: Medicare HMO | Admitting: Adult Health

## 2018-02-26 ENCOUNTER — Ambulatory Visit: Payer: Medicare HMO | Admitting: Adult Health

## 2018-02-26 ENCOUNTER — Encounter: Payer: Self-pay | Admitting: Adult Health

## 2018-02-26 VITALS — BP 134/70 | HR 99 | Ht 60.0 in | Wt 208.4 lb

## 2018-02-26 DIAGNOSIS — E668 Other obesity: Secondary | ICD-10-CM | POA: Insufficient documentation

## 2018-02-26 DIAGNOSIS — Z9989 Dependence on other enabling machines and devices: Secondary | ICD-10-CM | POA: Diagnosis not present

## 2018-02-26 DIAGNOSIS — G4733 Obstructive sleep apnea (adult) (pediatric): Secondary | ICD-10-CM

## 2018-02-26 NOTE — Progress Notes (Signed)
Reviewed and agree with assessment/plan.   Yasemin Rabon, MD [place] [facility] 11/19/2016, 12:24 PM Pager:  [phone]  

## 2018-02-26 NOTE — Patient Instructions (Signed)
Continue on CPAP At bedtime   Order for new machine .  Wear each night for at least 6hr .  Work on healthy weight .  Do not drive if sleepy .  Keep legs elevated, follow up with Primary MD for swelling .  Follow up with Dr. Halford Chessman  In 2 months and As needed

## 2018-02-26 NOTE — Progress Notes (Signed)
@Patient  ID: Wanda Hanson, female    DOB: 11-Feb-1946, 72 y.o.   MRN: [id number]  Chief Complaint  Patient presents with  . Follow-up    OSA    Referring provider: Parke Poisson, MD  HPI: 72 year old female followed for obstructive sleep apnea PMH : DM, HTN, CML   02/26/2018 Follow up ; OSA  Patient presents for a 68-month follow-up.  Patient was last seen to establish for sleep apnea. She has been on CPAP for years. Feels she benefits  Wears each night , all night for 6-7 hr . Patient was set up for a home sleep study done on January 31, 2018 that showed mild sleep apnea with an AHI at 7.4 and SaO2 low at 80%. Needs a new machine , her machine is very old .    Allergies  Allergen Reactions  . Ace Inhibitors Swelling  . Asa [Aspirin]     Ulcer   . Latex   . Morphine And Related     SOB   . Tylenol [Acetaminophen]     Ulcer     Immunization History  Administered Date(s) Administered  . Influenza Split 08/08/2017  . Pneumococcal Conjugate-13 08/08/2017    Past Medical History:  Diagnosis Date  . Chronic back pain   . CML (chronic myelocytic leukemia) ([employer]) 2015  . Depression   . Diabetes mellitus (Uncertain)   . Eczema   . Fatty liver   . GERD (gastroesophageal reflux disease)   . Gout   . Hyperlipidemia   . Hypertension   . Migraine   . OSA (obstructive sleep apnea)   . Peripheral neuropathy   . Restless leg syndrome   . Rosacea   . Seborrheic dermatitis     Tobacco History: Social History   Tobacco Use  Smoking Status Never Smoker  Smokeless Tobacco Never Used   Counseling given: Not Answered   Outpatient Encounter Medications as of 02/26/2018  Medication Sig  . allopurinol (ZYLOPRIM) 300 MG tablet Take 300 mg by mouth daily.  Marland Kitchen amLODipine (NORVASC) 5 MG tablet Take 5 mg by mouth 2 (two) times daily.  . benzonatate (TESSALON) 200 MG capsule Take 1 capsule (200 mg total) by mouth 3 (three) times daily as needed for cough.  . diclofenac sodium  (VOLTAREN) 1 % GEL Apply 1 application topically daily as needed for pain. Right great toe  . diphenhydrAMINE (BENADRYL) 50 MG capsule Take 1 capsule (50 mg total) by mouth at bedtime as needed for sleep.  . DULoxetine (CYMBALTA) 60 MG capsule Take 60 mg by mouth daily.  . fluticasone (FLONASE) 50 MCG/ACT nasal spray Place 1 spray into both nostrils daily.  . furosemide (LASIX) 20 MG tablet Take 20 mg by mouth.  . gabapentin (NEURONTIN) 100 MG capsule Take 400 mg by mouth 2 (two) times daily.  Marland Kitchen glipiZIDE (GLUCOTROL) 5 MG tablet Take 5 mg by mouth daily before breakfast.  . guaiFENesin (MUCINEX) 600 MG 12 hr tablet Take 1 tablet (600 mg total) by mouth 2 (two) times daily as needed for cough or to loosen phlegm.  . iron polysaccharides (NIFEREX) 150 MG capsule Take 150 mg by mouth daily.  Marland Kitchen lisinopril (PRINIVIL,ZESTRIL) 10 MG tablet Take 10 mg by mouth daily.  Marland Kitchen LORazepam (ATIVAN) 0.5 MG tablet Take 1 mg by mouth 3 times/day as needed-between meals & bedtime for sleep.   . meclizine (ANTIVERT) 12.5 MG tablet Take 12.5 mg by mouth 3 (three) times daily as needed for dizziness.  Marland Kitchen  Multiple Vitamins-Minerals (MULTIVITAMIN WITH MINERALS) tablet Take 1 tablet by mouth daily.  . nilotinib (TASIGNA) 200 MG capsule Take 200 mg by mouth every 12 (twelve) hours. Give on an empty stomach 1 hour before or 2 hours after meals.  Marland Kitchen omeprazole (PRILOSEC) 40 MG capsule Take 40 mg by mouth daily.  . potassium chloride (KLOR-CON) 20 MEQ packet Take by mouth 2 (two) times daily.  . pravastatin (PRAVACHOL) 10 MG tablet Take 10 mg by mouth daily.  Marland Kitchen rOPINIRole (REQUIP) 1 MG tablet Take 4 mg by mouth at bedtime.   Marland Kitchen tiZANidine (ZANAFLEX) 4 MG capsule Take 4 mg by mouth at bedtime.   . traMADol (ULTRAM) 50 MG tablet Take by mouth every 6 (six) hours as needed.  . pregabalin (LYRICA) 50 MG capsule Take 100 mg by mouth at bedtime.   . [DISCONTINUED] spironolactone (ALDACTONE) 25 MG tablet Take 25 mg by mouth daily.    No facility-administered encounter medications on file as of 02/26/2018.      Review of Systems  Constitutional:   No  weight loss, night sweats,  Fevers, chills,  +fatigue, or  lassitude.  HEENT:   No headaches,  Difficulty swallowing,  Tooth/dental problems, or  Sore throat,                No sneezing, itching, ear ache, nasal congestion, post nasal drip,   CV:  No chest pain,  Orthopnea, PND,  , anasarca, dizziness, palpitations, syncope.   GI  No heartburn, indigestion, abdominal pain, nausea, vomiting, diarrhea, change in bowel habits, loss of appetite, bloody stools.   Resp: No shortness of breath with exertion or at rest.  No excess mucus, no productive cough,  No non-productive cough,  No coughing up of blood.  No change in color of mucus.  No wheezing.  No chest wall deformity  Skin: no rash or lesions.  GU: no dysuria, change in color of urine, no urgency or frequency.  No flank pain, no hematuria   MS:  No joint pain or swelling.  No decreased range of motion.  No back pain.    Physical Exam  BP 134/70 (BP Location: Left Arm, Cuff Size: Large)   Pulse 99   Ht 5' (1.524 m)   Wt 208 lb 6.4 oz (94.5 kg)   SpO2 95%   BMI 40.70 kg/m   GEN: A/Ox3; pleasant , NAD, obese    HEENT:  [place]/AT,  EACs-clear, TMs-wnl, NOSE-clear, THROAT-clear, no lesions, no postnasal drip or exudate noted. Class 3 MP airway   NECK:  Supple w/ fair ROM; no JVD; normal carotid impulses w/o bruits; no thyromegaly or nodules palpated; no lymphadenopathy.    RESP  Clear  P & A; w/o, wheezes/ rales/ or rhonchi. no accessory muscle use, no dullness to percussion  CARD:  RRR, no m/r/g, 1+ peripheral edema, pulses intact, no cyanosis or clubbing. Stasis dermatatic changes along lower legs.   GI:   Soft & nt; nml bowel sounds; no organomegaly or masses detected.   Musco: Warm bil, no deformities or joint swelling noted.   Neuro: alert, no focal deficits noted.    Skin: Warm, no lesions or  rashes    Lab Results:  CBC   BNP No results found for: BNP  ProBNP No results found for: PROBNP  Imaging: No results found.   Assessment & Plan:   OSA on CPAP OSA -controlled on CPAP  Order for new machine to DME   Plan  Patient Instructions  Continue on CPAP At bedtime   Order for new machine .  Wear each night for at least 6hr .  Work on healthy weight .  Do not drive if sleepy .  Keep legs elevated, follow up with Primary MD for swelling .  Follow up with Dr. Halford Chessman  In 2 months and As needed        Moderate obesity Wt loss      Rexene Edison, NP 02/26/2018

## 2018-02-26 NOTE — Assessment & Plan Note (Signed)
Wt loss  

## 2018-02-26 NOTE — Assessment & Plan Note (Signed)
OSA -controlled on CPAP  Order for new machine to DME   Plan  Patient Instructions  Continue on CPAP At bedtime   Order for new machine .  Wear each night for at least 6hr .  Work on healthy weight .  Do not drive if sleepy .  Keep legs elevated, follow up with Primary MD for swelling .  Follow up with Dr. Halford Chessman  In 2 months and As needed

## 2018-03-03 ENCOUNTER — Telehealth: Payer: Self-pay | Admitting: Adult Health

## 2018-03-03 NOTE — Telephone Encounter (Signed)
I called [employer] & spoke to [place].  She states pt got a new machine 04/2013 & will be eligible for a new machine 04/2018.  If she is having issues with old machine they can give her name of company to use for repair.  Called pt back & told her she can get new machine 04/2018.  She was fine with that.  She has appt with VS in June & will ask him then for new order.  Nothing further needed.

## 2018-04-16 ENCOUNTER — Ambulatory Visit: Payer: Medicare HMO | Admitting: Pulmonary Disease

## 2018-05-10 ENCOUNTER — Ambulatory Visit: Payer: Medicare HMO | Admitting: Pulmonary Disease

## 2018-05-20 ENCOUNTER — Telehealth: Payer: Self-pay | Admitting: Pulmonary Disease

## 2018-05-20 DIAGNOSIS — Z9989 Dependence on other enabling machines and devices: Principal | ICD-10-CM

## 2018-05-20 DIAGNOSIS — G4733 Obstructive sleep apnea (adult) (pediatric): Secondary | ICD-10-CM

## 2018-05-20 NOTE — Telephone Encounter (Signed)
Pt seen for [facility] 01/08/18 by VS.  F/U on 02/26/18 by TP Cancelled 04/16/18 and 05/10/18 OV's scheduled with VS. No pending OV's at this time.     Pt is now due for new CPAP machine; VS please advise if you are okay with sending order for new CPAP or would you like to have pt come back in for follow up? Thanks.

## 2018-05-20 NOTE — Telephone Encounter (Signed)
Can send order for auto CPAP range 5 to 15 cm H2O with heated humidity.

## 2018-07-07 ENCOUNTER — Ambulatory Visit: Payer: Medicare HMO | Admitting: Pulmonary Disease

## 2018-07-14 ENCOUNTER — Ambulatory Visit: Payer: Medicare HMO | Admitting: Pulmonary Disease

## 2018-07-14 ENCOUNTER — Encounter: Payer: Self-pay | Admitting: Pulmonary Disease

## 2018-07-14 VITALS — BP 126/76 | HR 88 | Ht 60.0 in | Wt 212.0 lb

## 2018-07-14 DIAGNOSIS — G4733 Obstructive sleep apnea (adult) (pediatric): Secondary | ICD-10-CM | POA: Diagnosis not present

## 2018-07-14 DIAGNOSIS — Z9989 Dependence on other enabling machines and devices: Secondary | ICD-10-CM | POA: Diagnosis not present

## 2018-07-14 DIAGNOSIS — E668 Other obesity: Secondary | ICD-10-CM

## 2018-07-14 NOTE — Patient Instructions (Signed)
Will call with results of CPAP report  Follow up in 1 year 

## 2018-07-14 NOTE — Progress Notes (Signed)
[facility]  Chief Complaint  Patient presents with  . Follow-up    wearing cpap avg 7-8hr nightly- feels pressure and mask are okay. QIH:[id number]    Constitutional: BP 126/76 (BP Location: Left Arm, Cuff Size: Normal)   Pulse 88   Ht 5' (1.524 m)   Wt 212 lb (96.2 kg)   SpO2 97%   BMI 41.40 kg/m   History of Present Illness: Wanda Hanson is a 72 y.o. female with obstructive sleep apnea.  She had viral pneumonia from [facility] virus in December 2018.  She has been doing well since she got new CPAP machine.  No issues with mask fit.  Gets occasional sinus congestion, but not an issue with using CPAP.  Denies dry mouth or sore throat.  Gets about 7 to 8 hrs of sleep per night.  Still feels tired during the day, but not as bad as before.  Comprehensive Respiratory Exam:  Appearance - well kempt  ENMT - nasal mucosa moist, turbinates clear, midline nasal septum, narrow nasal angles, no dental lesions, no gingival bleeding, no oral exudates, no tonsillar hypertrophy, MP 3 Neck - no masses, trachea midline, no thyromegaly, no elevation in JVP Respiratory - normal appearance of chest wall, normal respiratory effort w/o accessory muscle use, no dullness on percussion, no wheezing or rales CV - s1s2 regular rate and rhythm, no murmurs, no peripheral edema, radial pulses symmetric GI - soft, non tender, no masses Lymph - no adenopathy noted in neck and axillary areas MSK - normal muscle strength and tone, uses a walker Ext - no cyanosis, clubbing, or joint inflammation noted Skin - no rashes, lesions, or ulcers Neuro - oriented to person, place, and time Psych - normal mood and affect   Assessment/Plan:  Obstructive sleep apnea. - she is compliant with CPAP and reports benefit from therapy - continue auto CPAP - she bring her SD card to the office for download, and will call her with results  Obesity. - reviewed options to assist with weight  loss   Patient Instructions  Will call with results of CPAP report  Follow up in 1 year   Chesley Mires, MD [place] 07/14/2018, 4:53 PM Pager:  [phone]  Flow Sheet  Sleep tests: Auto CPAP 10/10/17 to 01/07/18 >> used on 78 of 90 nights with average 7 hrs 5 min.  Average AHI 0.5 with median CPAP 9 and 95 th percentile CPAP 11 cm H2O. HST 01/31/18 >> AHI 7.4, SaO2 low 80%.  Past Medical History: She  has a past medical history of Chronic back pain, CML (chronic myelocytic leukemia) ([place]) (2015), Depression, Diabetes mellitus ([place]), Eczema, Fatty liver, GERD (gastroesophageal reflux disease), Gout, Hyperlipidemia, Hypertension, Migraine, OSA (obstructive sleep apnea), Peripheral neuropathy, Restless leg syndrome, Rosacea, and Seborrheic dermatitis.  Past Surgical History: She  has a past surgical history that includes Back surgery; Abdominal hysterectomy; and Joint replacement.  Family History: Her family history includes Lung cancer in her father.  Social History: She  reports that she has never smoked. She has never used smokeless tobacco. She reports that she does not drink alcohol or use drugs.  Medications: Allergies as of 07/14/2018      Reactions   Ace Inhibitors Swelling   Asa [aspirin]    Ulcer   Latex    Morphine And Related    SOB   Tylenol [acetaminophen]    Ulcer      Medication List  Accurate as of 07/14/18  4:53 PM. Always use your most recent med list.          allopurinol 300 MG tablet Commonly known as:  ZYLOPRIM Take 300 mg by mouth daily.   amLODipine 5 MG tablet Commonly known as:  NORVASC Take 5 mg by mouth 2 (two) times daily.   benzonatate 200 MG capsule Commonly known as:  TESSALON Take 1 capsule (200 mg total) by mouth 3 (three) times daily as needed for cough.   diclofenac sodium 1 % Gel Commonly known as:  VOLTAREN Apply 1 application topically daily as needed for pain. Right great toe     diphenhydrAMINE 50 MG capsule Commonly known as:  BENADRYL Take 1 capsule (50 mg total) by mouth at bedtime as needed for sleep.   DULoxetine 60 MG capsule Commonly known as:  CYMBALTA Take 60 mg by mouth daily.   fluticasone 50 MCG/ACT nasal spray Commonly known as:  FLONASE Place 1 spray into both nostrils daily.   furosemide 20 MG tablet Commonly known as:  LASIX Take 20 mg by mouth.   glipiZIDE 5 MG tablet Commonly known as:  GLUCOTROL Take 5 mg by mouth daily before breakfast.   guaiFENesin 600 MG 12 hr tablet Commonly known as:  MUCINEX Take 1 tablet (600 mg total) by mouth 2 (two) times daily as needed for cough or to loosen phlegm.   iron polysaccharides 150 MG capsule Commonly known as:  NIFEREX Take 150 mg by mouth daily.   lisinopril 10 MG tablet Commonly known as:  PRINIVIL,ZESTRIL Take 10 mg by mouth daily.   LORazepam 0.5 MG tablet Commonly known as:  ATIVAN Take 1 mg by mouth 3 times/day as needed-between meals & bedtime for sleep.   meclizine 12.5 MG tablet Commonly known as:  ANTIVERT Take 12.5 mg by mouth 3 (three) times daily as needed for dizziness.   multivitamin with minerals tablet Take 1 tablet by mouth daily.   NEURONTIN 100 MG capsule Generic drug:  gabapentin Take 400 mg by mouth 2 (two) times daily.   nilotinib 200 MG capsule Commonly known as:  TASIGNA Take 200 mg by mouth every 12 (twelve) hours. Give on an empty stomach 1 hour before or 2 hours after meals.   omeprazole 40 MG capsule Commonly known as:  PRILOSEC Take 40 mg by mouth daily.   potassium chloride 20 MEQ packet Commonly known as:  KLOR-CON Take by mouth 2 (two) times daily.   pravastatin 10 MG tablet Commonly known as:  PRAVACHOL Take 10 mg by mouth daily.   rOPINIRole 1 MG tablet Commonly known as:  REQUIP Take 4 mg by mouth at bedtime.   tiZANidine 4 MG capsule Commonly known as:  ZANAFLEX Take 4 mg by mouth at bedtime.   traMADol 50 MG  tablet Commonly known as:  ULTRAM Take by mouth every 6 (six) hours as needed.

## 2018-07-28 ENCOUNTER — Telehealth: Payer: Self-pay | Admitting: Pulmonary Disease

## 2018-07-28 ENCOUNTER — Telehealth: Payer: Self-pay | Admitting: Pharmacy Technician

## 2018-07-28 NOTE — Telephone Encounter (Signed)
Called and spoke to pt.  Pt stated at last OV, she was instructed to bring SD by our office for download.  Pt states that her machine does not have SD. I have checked airview and there is no recent data. Data ends 12/2017. I have contacted [employer] and requested download and airview to be updated. Will hold message in triage until download is received.

## 2018-07-29 NOTE — Telephone Encounter (Signed)
I checked airview and she is not updated in the system yet. I will check the fax to see if it was faxed.

## 2018-07-30 NOTE — Telephone Encounter (Signed)
DL has been located in VS's look at.   Will route to [facility] for f/u. Thanks

## 2018-08-03 NOTE — Telephone Encounter (Signed)
A user error has taken place.

## 2018-08-04 NOTE — Telephone Encounter (Signed)
Wanda Hanson, please advise if Dr. Halford Chessman has looked at pt's download and if encounter can be closed. Thanks.

## 2018-08-09 NOTE — Telephone Encounter (Signed)
Left message with patient, VS has download awaiting review.

## 2018-08-10 NOTE — Telephone Encounter (Signed)
Auto CPAP 06/29/18 to 07/28/18 >> used on 30 of 30 nights with average 8 hrs 49 min.  Average AHI 1.3 with median CPAP 9 and 95 th percentile CPAP 12 cm H2O.   Please let her know that CPAP report shows good control with current settings.

## 2018-08-11 NOTE — Telephone Encounter (Signed)
Called and spoke with patient, advised of VS response. Patient verbalized understanding. Nothing further needed.

## 2019-10-10 ENCOUNTER — Other Ambulatory Visit: Payer: Self-pay

## 2019-10-10 ENCOUNTER — Ambulatory Visit (INDEPENDENT_AMBULATORY_CARE_PROVIDER_SITE_OTHER): Payer: Medicare Other | Admitting: Acute Care

## 2019-10-10 ENCOUNTER — Encounter: Payer: Self-pay | Admitting: Acute Care

## 2019-10-10 DIAGNOSIS — R0789 Other chest pain: Secondary | ICD-10-CM

## 2019-10-10 DIAGNOSIS — G4733 Obstructive sleep apnea (adult) (pediatric): Secondary | ICD-10-CM | POA: Diagnosis not present

## 2019-10-10 DIAGNOSIS — R509 Fever, unspecified: Secondary | ICD-10-CM | POA: Diagnosis not present

## 2019-10-10 DIAGNOSIS — R000 Tachycardia, unspecified: Secondary | ICD-10-CM | POA: Diagnosis not present

## 2019-10-10 DIAGNOSIS — R0600 Dyspnea, unspecified: Secondary | ICD-10-CM | POA: Diagnosis not present

## 2019-10-10 DIAGNOSIS — C9211 Chronic myeloid leukemia, BCR/ABL-positive, in remission: Secondary | ICD-10-CM

## 2019-10-10 MED ORDER — AZITHROMYCIN 250 MG PO TABS: ORAL_TABLET | ORAL | 0 refills | Status: DC

## 2019-10-10 MED ORDER — PREDNISONE 10 MG PO TABS: ORAL_TABLET | ORAL | 0 refills | Status: DC

## 2019-10-10 NOTE — Patient Instructions (Addendum)
It was good to talk with you today We will prescribe a prednisone taper  Prednisone taper; 10 mg tablets: 4 tabs x 2 days, 3 tabs x 2 days, 2 tabs x 2 days 1 tab x 2 days then stop. Watch blood sugars carefully while on prednisone We will prescribe a z pack  Take 2 tablets today and then one tablet daily until gone.  CXR Friday when you come to see Dr. Halford Chessman ( previously scheduled appointment )  CBC, BMET, [place] Friday when in the office  Covid test prior to coming to the office Friday We will order a 2 D echo We will refer you to [facility] for consultation re: heart racing and changes noted on CT scan  Follow up with Dr. Jerrye Noble at [facility] as you have been doing Follow up with your PCP as needed regarding other non-pulmonary issues.  Please contact office for sooner follow up if symptoms do not improve or worsen or seek emergency care

## 2019-10-10 NOTE — Progress Notes (Signed)
Virtual Visit via Telephone Note  I connected with Wanda Hanson on 10/10/19 at 10:30 AM EST by telephone and verified that I am speaking with the correct person using two identifiers.  Location: Patient: At home Provider: At the office at [address], [address]   I discussed the limitations, risks, security and privacy concerns of performing an evaluation and management service by telephone and the availability of in person appointments. I also discussed with the patient that there may be a patient responsible charge related to this service. The patient expressed understanding and agreed to proceed.   Pt is seen by Dr. Halford Chessman for OSA/ CPAP and for previous treatment of pneumonia in 2018. Last OV was 2019.She is a never smoker.   History of Present Illness: Pt. Presents for tele visit.She has an extensive medical history including CML, Depression, Diabetes, Fatty Liver, GERD, OSA on CPAP .  Pt has been experiencing  some shortness of breath and chest tightness since the end of October. She was also having wheezing with minimal exertion which is new for her. .She has had additional viral like symptoms. She was seen initially by an urgent care center on 10/02/2019. They sent her to the ED at [facility]  for a CTA to look for PE. CTA was negative for PE, but did show some changes concerning for small airways disease , no pneumonia. CXR that same day showed prominent bilateral pulmonary vasculature, and question of Pulmonary hypertension She was covid tested at that time ( Negative). Labs indicated a slightly elevated WBC and elevated Alk phos. She has had double pneumonia in the past. Dr. Halford Chessman treated her for this in 2018. She states she has had some night sweats, and chills. She continues to endorse this. She has not actually taken her temperature. She states she has fatigue. She states she also notes her heart is racing at times. Troponin in the ED was 13. EKG shows ST. She does not  endorse any chest pain. .She states she has  foot and ankle swelling which is chronic for her. She does take Lasix 20 mg daily. She states she is compliant with this. Secretions are yellow to tan. She is able to speak to me in full sentences. I do not hear any audible wheezes. She does not sound like she is in any distress.    Observations/Objective: CXR 10/02/2019 IMPRESSION: No evidence of active disease. Prominent bilateral pulmonary vasculature. Raises the possibility of pulmonary hypertension.  10/02/2019 CTA Chest  No CT imaging evidence of acute pulmonary thromboembolic disease to the level of the segmental pulmonary arteries.  Pulmonary mosaicism, partially accentuated by incidental expiratory phase images with diffuse airway wall thickening, may favor small airway disease. No definite pneumonia. Similar cirrhotic liver morphology with trace perihepatic ascites  EKG 10/02/2019 Ventricular Rate 101 BPM  Atrial Rate 101 BPM  P-R Interval 156 ms  QRS Duration 88 ms  Q-T Interval 378 ms  QTC 490 ms  P Axis 66 degrees  R Axis -41 degrees  T Axis 91 degrees     Technically poor tracing, baseline electrical noise   Sinus tachycardia   Left axis deviation   Septal infarct (cited on or before 20-Aug-2015)   When compared with ECG of 26-Oct-2017 21:22,   Evidence for Inferior infarct are no longer present   Questionable change in initial forces of anterior leads   ST now depressed in inferior leads   Labs 11/8 ED visit [facility] SARS Covid >>  negative Rapid Influenza A Negative Rapid Influenza B negative  Na 140 K 3.5 Chloride 105 CO2 26 BUN 14 Glucose 81 Creatinine 0.87 Calcium 9.5 Albumin 4.3 Alk Phos 146 Gap 9 Troponin 13 TSH 4.510  WBC 10.8 HGB 12.4 HCT 37.5 Platelets 256 Basophil absolute 0.2  D dimer 1,180 NG/ML FEU  09/12/2019 Left Lower extremity Doppler Studies No evidence of DVT  In Left lower extremity  Sleep tests: Auto CPAP  10/10/17 to 01/07/18 >> used on 78 of 90 nights with average 7 hrs 5 min.  Average AHI 0.5 with median CPAP 9 and 95 th percentile CPAP 11 cm H2O. HST 01/31/18 >> AHI 7.4, SaO2 low 80%.  Assessment and Plan: Dyspnea with continued chills, sweats and fatigue Slow to resolve viral symptoms Wheezing with exertion  WBC 11/8>> 10.8 CML>> will treat aggressively Plan We will prescribe a prednisone taper  Prednisone taper; 10 mg tablets: 4 tabs x 2 days, 3 tabs x 2 days, 2 tabs x 2 days 1 tab x 2 days then stop. Watch blood sugars carefully while on prednisone We will prescribe a z pack  Take 2 tablets today and then one tablet daily until gone.  CXR Friday when you come to see Dr. Halford Chessman CBC, BMET, MAG, Alk phos  Friday when in the office  Covid test prior to coming to the office Friday at [place] racing/ Dyspnea HGB 12.4 on 11/8 We will order a 2 D echo to better evaluate your heart function ( Check PAP) We will refer you to [facility] for consultation re: heart racing and changes noted on CT scan   OSA on CPAP Continue wearing CPAP each night Follow up with Dr. Halford Chessman 10/14/2019 as is scheduled.   CML in remission per patient husband  Follow up with Dr. Jerrye Noble at [facility]    Follow Up Instructions: Follow up with Dr. Halford Chessman Friday 11/20  as was previously scheduled    I discussed the assessment and treatment plan with the patient. The patient was provided an opportunity to ask questions and all were answered. The patient agreed with the plan and demonstrated an understanding of the instructions.   The patient was advised to call back or seek an in-person evaluation if the symptoms worsen or if the condition fails to improve as anticipated.  I provided 40 minutes minutes of non-face-to-face time during this encounter.   Magdalen Spatz, NP 10/10/2019 4:31 PM

## 2019-10-11 ENCOUNTER — Telehealth: Payer: Self-pay | Admitting: Acute Care

## 2019-10-11 NOTE — Telephone Encounter (Signed)
Call returned to patient husband Dominica Severin, he states they went for this patients covid testing yesterday at [facility] and they were told they were too late and to come back today. They went back today and they were told they were not taking anymore people at the moment and to drive around for a hour and come back. He states the site was over crowded and there were cars everywhere. [place] they could take her at their site. He just wanted to make [place] aware.   Nothing further needed at this time.

## 2019-10-11 NOTE — Progress Notes (Signed)
Reviewed and agree with assessment/plan.   Sokha Craker, MD [facility] 11/19/2016, 12:24 PM Pager:  [phone]  

## 2019-10-13 ENCOUNTER — Telehealth: Payer: Self-pay | Admitting: Pulmonary Disease

## 2019-10-13 NOTE — Telephone Encounter (Signed)
Called the patient back and she stated she called [facility] and the results were not received yet. She stated her PCP ordered the test. Advised her that until those results are received the visit for tomorrow will be changed to a televisit as she is not that familiar with mychart for video visit.  Appointment changed, nothing further needed at this time.

## 2019-10-14 ENCOUNTER — Ambulatory Visit: Payer: Medicare Other | Admitting: Pulmonary Disease

## 2019-10-14 ENCOUNTER — Ambulatory Visit (INDEPENDENT_AMBULATORY_CARE_PROVIDER_SITE_OTHER): Payer: Medicare Other | Admitting: Pulmonary Disease

## 2019-10-14 ENCOUNTER — Encounter: Payer: Self-pay | Admitting: Pulmonary Disease

## 2019-10-14 ENCOUNTER — Other Ambulatory Visit: Payer: Self-pay

## 2019-10-14 DIAGNOSIS — Z9989 Dependence on other enabling machines and devices: Secondary | ICD-10-CM | POA: Diagnosis not present

## 2019-10-14 DIAGNOSIS — G4733 Obstructive sleep apnea (adult) (pediatric): Secondary | ICD-10-CM

## 2019-10-14 DIAGNOSIS — Q2579 Other congenital malformations of pulmonary artery: Secondary | ICD-10-CM

## 2019-10-14 DIAGNOSIS — R0602 Shortness of breath: Secondary | ICD-10-CM | POA: Diagnosis not present

## 2019-10-14 NOTE — Progress Notes (Signed)
[place] [facility]  Chief Complaint  Patient presents with  . Follow-up    patient reports wearing CPAP- need download     Constitutional:  There were no vitals taken for this visit.  Deferred  Past Medical History:  CML, HTN, DM, RLS, Migraine HA, HTN, HLD, Gout, GERD, Eczema, Depression, Back pain  Brief Summary:  Wanda Hanson is a 73 y.o. female with obstructive sleep apnea.  She had viral pneumonia from [facility] virus in December 2018.  Virtual Visit via Telephone Note  I connected with Wanda Hanson on 10/14/19 at 11:15 AM EST by telephone and verified that I am speaking with the correct person using two identifiers.  Location: Patient: home Provider: medical office   I discussed the limitations, risks, security and privacy concerns of performing an evaluation and management service by telephone and the availability of in person appointments. I also discussed with the patient that there may be a patient responsible charge related to this service. The patient expressed understanding and agreed to proceed.  She had URI symptoms earlier this month.  Slowly recovering.  Still gets winded and has ankle swelling.  Also still has intermittent chills. Had COVID testing done, but hasn't heard results.    Physical Exam:  Deferred  Assessment/Plan:   Dyspnea with recent URI. - CT chest findings suggestive of viral pneumonia - advised her to contact PCP to get COVID results - compete course of prednisone and Abx from earlier this week  Enlarged PA on CT chest. HTN with possible diastolic CHF. - f/u Echo  Obstructive sleep apnea. - continue auto CPAP   Patient Instructions  Follow up in 2 weeks if your COVID test result is negative    I discussed the assessment and treatment plan with the patient. The patient was provided an opportunity to ask questions and all were answered. The patient agreed with the plan and demonstrated an  understanding of the instructions.   The patient was advised to call back or seek an in-person evaluation if the symptoms worsen or if the condition fails to improve as anticipated.  I provided 22 minutes of non-face-to-face time during this encounter.   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  Average AHI 0.5 with median CPAP 9 and 95 th percentile CPAP 11 cm H2O. HST 01/31/18 >> AHI 7.4, SaO2 low 80%. Auto CPAP 06/29/18 to 07/28/18 >> used on 30 of 30 nights with average 8 hrs 49 min.  Average AHI 1.3 with median CPAP 9 and 95 th percentile CPAP 12 cm H2O.  Medications:   Allergies as of 10/14/2019      Reactions   Ace Inhibitors Swelling   Asa [aspirin]    Ulcer   Latex    Morphine And Related    SOB   Neosporin [bacitracin-polymyxin B]    Swelling where applied    Tylenol [acetaminophen]    Ulcer      Medication List       Accurate as of October 14, 2019 11:50 AM. If you have any questions, ask your nurse or doctor.        STOP taking these medications   benzonatate 200 MG capsule Commonly known  as: TESSALON Stopped by: Chesley Mires, MD     TAKE these medications   allopurinol 300 MG tablet Commonly known as: ZYLOPRIM Take 300 mg by mouth daily.   amLODipine 5 MG tablet Commonly known as: NORVASC Take 5 mg by mouth 2 (two) times daily.   azithromycin 250 MG tablet Commonly known as: ZITHROMAX Take 2 pills today then one a day for 4 additional days. DX: R50.9   diclofenac sodium 1 % Gel Commonly known as: VOLTAREN Apply 1 application topically daily as needed for pain. Right great toe   diphenhydrAMINE 50 MG capsule Commonly known as: BENADRYL Take 1 capsule (50 mg total) by mouth at  bedtime as needed for sleep.   DULoxetine 60 MG capsule Commonly known as: CYMBALTA Take 60 mg by mouth daily.   fluticasone 50 MCG/ACT nasal spray Commonly known as: FLONASE Place 1 spray into both nostrils daily.   furosemide 20 MG tablet Commonly known as: LASIX Take 20 mg by mouth.   glipiZIDE 5 MG tablet Commonly known as: GLUCOTROL Take 5 mg by mouth daily before breakfast.   guaiFENesin 600 MG 12 hr tablet Commonly known as: MUCINEX Take 1 tablet (600 mg total) by mouth 2 (two) times daily as needed for cough or to loosen phlegm.   iron polysaccharides 150 MG capsule Commonly known as: NIFEREX Take 150 mg by mouth daily.   lisinopril 10 MG tablet Commonly known as: ZESTRIL Take 10 mg by mouth daily.   LORazepam 0.5 MG tablet Commonly known as: ATIVAN Take 1 mg by mouth 3 times/day as needed-between meals & bedtime for sleep.   meclizine 12.5 MG tablet Commonly known as: ANTIVERT Take 12.5 mg by mouth 3 (three) times daily as needed for dizziness.   multivitamin with minerals tablet Take 1 tablet by mouth daily.   Neurontin 100 MG capsule Generic drug: gabapentin Take 400 mg by mouth 2 (two) times daily.   nilotinib 200 MG capsule Commonly known as: TASIGNA Take 200 mg by mouth every 12 (twelve) hours. Give on an empty stomach 1 hour before or 2 hours after meals.   omeprazole 40 MG capsule Commonly known as: PRILOSEC Take 40 mg by mouth daily.   potassium chloride 20 MEQ packet Commonly known as: KLOR-CON Take by mouth 2 (two) times daily.   pravastatin 10 MG tablet Commonly known as: PRAVACHOL Take 10 mg by mouth daily.   predniSONE 10 MG tablet Commonly known as: DELTASONE Take 4 tabs for 2 days, then 3 tabs for 2 days, 2 tabs for 2 days, then 1 tab for 2 days, then stop.   rOPINIRole 1 MG tablet Commonly known as: REQUIP Take 4 mg by mouth at bedtime.   tiZANidine 4 MG capsule Commonly known as: ZANAFLEX Take 4 mg by mouth at bedtime.    traMADol 50 MG tablet Commonly known as: ULTRAM Take by mouth every 6 (six) hours as needed.       Past Surgical History:  She  has a past surgical history that includes Back surgery; Abdominal hysterectomy; and Joint replacement.  Family History:  Her family history includes Lung cancer in her father.  Social History:  She  reports that she has never smoked. She has never used smokeless tobacco. She reports that she does not drink alcohol or use drugs.

## 2019-10-14 NOTE — Patient Instructions (Signed)
Follow up in 2 weeks if your COVID test result is negative

## 2019-10-18 ENCOUNTER — Ambulatory Visit (HOSPITAL_COMMUNITY): Payer: Medicare Other | Attending: Cardiology

## 2019-10-18 ENCOUNTER — Other Ambulatory Visit: Payer: Self-pay

## 2019-10-18 DIAGNOSIS — R000 Tachycardia, unspecified: Secondary | ICD-10-CM

## 2019-10-18 DIAGNOSIS — I119 Hypertensive heart disease without heart failure: Secondary | ICD-10-CM | POA: Insufficient documentation

## 2019-10-18 DIAGNOSIS — E669 Obesity, unspecified: Secondary | ICD-10-CM | POA: Diagnosis not present

## 2019-10-18 DIAGNOSIS — E119 Type 2 diabetes mellitus without complications: Secondary | ICD-10-CM | POA: Diagnosis not present

## 2019-10-25 NOTE — Telephone Encounter (Signed)
Wanda Hanson, please advise on pt email:  Hello, [name] all is well with you and you had a nice Thanksgiving.  I just saw the results of the echocardiogram and I do not understand any of it.  Could you give me a call to help me understand better what it showed?  I would like that and you can tell me there is nothing wrong.  I look forward to hearing back from you.  Thank you!

## 2019-10-27 ENCOUNTER — Other Ambulatory Visit: Payer: Self-pay

## 2019-10-27 ENCOUNTER — Ambulatory Visit (INDEPENDENT_AMBULATORY_CARE_PROVIDER_SITE_OTHER): Payer: Medicare Other | Admitting: Pulmonary Disease

## 2019-10-27 ENCOUNTER — Encounter: Payer: Self-pay | Admitting: Pulmonary Disease

## 2019-10-27 VITALS — HR 101

## 2019-10-27 DIAGNOSIS — R000 Tachycardia, unspecified: Secondary | ICD-10-CM

## 2019-10-27 DIAGNOSIS — R0602 Shortness of breath: Secondary | ICD-10-CM | POA: Diagnosis not present

## 2019-10-27 DIAGNOSIS — Z9989 Dependence on other enabling machines and devices: Secondary | ICD-10-CM

## 2019-10-27 DIAGNOSIS — G4733 Obstructive sleep apnea (adult) (pediatric): Secondary | ICD-10-CM

## 2019-10-27 NOTE — Telephone Encounter (Signed)
Since SG is not in office today, patient has been scheduled for a televisit this afternoon with VS at 230. Nothing further needed.

## 2019-10-27 NOTE — Patient Instructions (Signed)
Will schedule follow up within a week with a chest xray

## 2019-10-27 NOTE — Progress Notes (Signed)
[place] [facility]  Chief Complaint  Patient presents with  . Follow-up    SOB, OSA on CPAP    Constitutional:  Pulse (!) 101   SpO2 91%   Past Medical History:  CML, HTN, DM, RLS, Migraine HA, HTN, HLD, Gout, GERD, Eczema, Depression, Back pain  Brief Summary:  Wanda Hanson is a 73 y.o. female with obstructive sleep apnea.  She had viral pneumonia from [facility] virus in December 2018.  Virtual Visit via Telephone Note  I connected with Wanda Hanson on 10/27/19 at  2:30 PM EST by telephone and verified that I am speaking with the correct person using two identifiers.  Location: Patient: home Provider: medical office   I discussed the limitations, risks, security and privacy concerns of performing an evaluation and management service by telephone and the availability of in person appointments. I also discussed with the patient that there may be a patient responsible charge related to this service. The patient expressed understanding and agreed to proceed.  COVID test from 10/11/19 was negative.  She still feels winded.  Feels like her heart is going fast, and she can hear her heart beat.  Has some cough and chest congestion.  Wheeze from throat.  Echo showed mild diastolic dysfunction, but was hyperdynamic.  Had lab tests at urgent care - not available for review.  Physical Exam:  Deferred  Assessment/Plan:   Dyspnea with recent URI. - recent CT chest findings suggestive of viral pneumonia - COVID test negative from 10/11/19 - had recent course of prednisone and ABx - still symptomatic with relatively low SpO2 - will need to have in office visit for further assessment with chest xray - will have her send lab results from urgent care  HTN with possible diastolic CHF. - noted to have hyperdynamic LV on Echo - might need thyroid testing  Obstructive sleep apnea. She is compliant with CPAP and reports benefit - continue auto CPAP    Patient Instructions  Will schedule follow up within a week with a chest xray    I discussed the assessment and treatment plan with the patient. The patient was provided an opportunity to ask questions and all were answered. The patient agreed with the plan and demonstrated an understanding of the instructions.   The patient was advised to call back or seek an in-person evaluation if the symptoms worsen or if the condition fails to improve as anticipated.  I provided 24 minutes of non-face-to-face time during this encounter.   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  Average AHI 0.5 with median CPAP 9 and 95 th percentile CPAP 11 cm H2O. HST 01/31/18 >> AHI 7.4, SaO2 low 80%. Auto CPAP 09/27/19 to 10/26/19 >> used on 30 of 30 nights with average 7 hrs 58 min.  Average AHI 0.8 with median CPAP 10 and 95 th percentile CPAP 13 cm H2O.  Cardiac tests:  Echo 10/18/19 >> EF 65 to 70%, grade 1 DD  Medications:   Allergies as of 10/27/2019      Reactions   Ace Inhibitors Swelling   Asa [aspirin]    Ulcer   Latex    Morphine And Related  SOB   Neosporin [bacitracin-polymyxin B]    Swelling where applied    Tylenol [acetaminophen]    Ulcer      Medication List       Accurate as of October 27, 2019  3:07 PM. If you have any questions, ask your nurse or doctor.        STOP taking these medications   azithromycin 250 MG tablet Commonly known as: ZITHROMAX Stopped by: Chesley Mires, MD   predniSONE 10 MG tablet Commonly known as: DELTASONE Stopped by: Chesley Mires, MD     TAKE these medications   allopurinol 300 MG tablet Commonly known as: ZYLOPRIM Take 300 mg by mouth daily.   amLODipine 5 MG tablet  Commonly known as: NORVASC Take 5 mg by mouth 2 (two) times daily.   diclofenac sodium 1 % Gel Commonly known as: VOLTAREN Apply 1 application topically daily as needed for pain. Right great toe   diphenhydrAMINE 50 MG capsule Commonly known as: BENADRYL Take 1 capsule (50 mg total) by mouth at bedtime as needed for sleep.   DULoxetine 60 MG capsule Commonly known as: CYMBALTA Take 60 mg by mouth daily.   fluticasone 50 MCG/ACT nasal spray Commonly known as: FLONASE Place 1 spray into both nostrils daily.   furosemide 20 MG tablet Commonly known as: LASIX Take 20 mg by mouth.   glipiZIDE 5 MG tablet Commonly known as: GLUCOTROL Take 5 mg by mouth daily before breakfast.   guaiFENesin 600 MG 12 hr tablet Commonly known as: MUCINEX Take 1 tablet (600 mg total) by mouth 2 (two) times daily as needed for cough or to loosen phlegm.   iron polysaccharides 150 MG capsule Commonly known as: NIFEREX Take 150 mg by mouth daily.   lisinopril 10 MG tablet Commonly known as: ZESTRIL Take 10 mg by mouth daily.   LORazepam 0.5 MG tablet Commonly known as: ATIVAN Take 1 mg by mouth 3 times/day as needed-between meals & bedtime for sleep.   meclizine 12.5 MG tablet Commonly known as: ANTIVERT Take 12.5 mg by mouth 3 (three) times daily as needed for dizziness.   multivitamin with minerals tablet Take 1 tablet by mouth daily.   Neurontin 100 MG capsule Generic drug: gabapentin Take 400 mg by mouth 2 (two) times daily.   nilotinib 200 MG capsule Commonly known as: TASIGNA Take 200 mg by mouth every 12 (twelve) hours. Give on an empty stomach 1 hour before or 2 hours after meals.   omeprazole 40 MG capsule Commonly known as: PRILOSEC Take 40 mg by mouth daily.   potassium chloride 20 MEQ packet Commonly known as: KLOR-CON Take by mouth 2 (two) times daily.   pravastatin 10 MG tablet Commonly known as: PRAVACHOL Take 10 mg by mouth daily.   rOPINIRole 1 MG tablet  Commonly known as: REQUIP Take 4 mg by mouth at bedtime.   tiZANidine 4 MG capsule Commonly known as: ZANAFLEX Take 4 mg by mouth at bedtime.   traMADol 50 MG tablet Commonly known as: ULTRAM Take by mouth every 6 (six) hours as needed.       Past Surgical History:  She  has a past surgical history that includes Back surgery; Abdominal hysterectomy; and Joint replacement.  Family History:  Her family history includes Lung cancer in her father.  Social History:  She  reports that she has never smoked. She has never used smokeless tobacco. She reports that she does not drink alcohol or use drugs.

## 2019-10-28 ENCOUNTER — Encounter: Payer: Self-pay | Admitting: Adult Health

## 2019-10-28 ENCOUNTER — Other Ambulatory Visit: Payer: Self-pay

## 2019-10-28 ENCOUNTER — Ambulatory Visit (INDEPENDENT_AMBULATORY_CARE_PROVIDER_SITE_OTHER): Payer: Medicare Other | Admitting: Adult Health

## 2019-10-28 ENCOUNTER — Ambulatory Visit (INDEPENDENT_AMBULATORY_CARE_PROVIDER_SITE_OTHER): Payer: Medicare Other

## 2019-10-28 VITALS — BP 116/70 | HR 77 | Temp 97.6°F

## 2019-10-28 DIAGNOSIS — J209 Acute bronchitis, unspecified: Secondary | ICD-10-CM | POA: Diagnosis not present

## 2019-10-28 DIAGNOSIS — G4733 Obstructive sleep apnea (adult) (pediatric): Secondary | ICD-10-CM

## 2019-10-28 DIAGNOSIS — R000 Tachycardia, unspecified: Secondary | ICD-10-CM

## 2019-10-28 DIAGNOSIS — J45909 Unspecified asthma, uncomplicated: Secondary | ICD-10-CM | POA: Insufficient documentation

## 2019-10-28 DIAGNOSIS — R0602 Shortness of breath: Secondary | ICD-10-CM

## 2019-10-28 DIAGNOSIS — Z9989 Dependence on other enabling machines and devices: Secondary | ICD-10-CM

## 2019-10-28 DIAGNOSIS — Z23 Encounter for immunization: Secondary | ICD-10-CM

## 2019-10-28 MED ORDER — ALBUTEROL SULFATE HFA 108 (90 BASE) MCG/ACT IN AERS: 2.0000 | INHALATION_SPRAY | RESPIRATORY_TRACT | 5 refills | Status: DC | PRN

## 2019-10-28 NOTE — Assessment & Plan Note (Signed)
Patient with complaints of tachycardia and pounding heartbeat.  2D echo was unrevealing with mild diastolic dysfunction.  EF was preserved.   With patient's ongoing symptoms will refer to cardiology for further evaluation as she has increased cardiovascular risk factors  Plan  Patient Instructions  Discuss with Primary MD that Lisinopril may be aggravating your cough .  Continue on Lasix 20mg  daily .  Low salt diet  Keep legs elevated.  Refer to Cardiology  Continue on CPAP At bedtime   Saline nasal rinses As needed   Saline nasal gel At bedtime   Advance activity as tolerated.  ProAir Inhaler 1-2 puffs every 4hrs as needed As needed   Follow up with Wanda Hanson  Or Wanda Hanson in 4 weeks with PFT and As needed   Please contact office for sooner follow up if symptoms do not improve or worsen or seek emergency care '

## 2019-10-28 NOTE — Progress Notes (Signed)
@Patient  ID: Wanda Hanson, female    DOB: May 10, 1946, 73 y.o.   MRN: [id number]  No chief complaint on file.   Referring provider: Parke Poisson, MD  HPI: 73 year old female never smoker followed for obstructive sleep apnea on nocturnal CPAP Previous bilateral PNA in 2018  Medical history significant for CML, diabetes, hypertension.    TEST/EVENTS :  Auto CPAP 10/10/17 to 01/07/18 >>used on 78 of 90 nights with average 7 hrs 5 min. Average AHI 0.5 with median CPAP 9 and 95 th percentile CPAP 11 cm H2O. HST 01/31/18 >> AHI 7.4, SaO2 low 80%. Auto CPAP 09/27/19 to 10/26/19 >> used on 30 of 30 nights with average 7 hrs 58 min.  Average AHI 0.8 with median CPAP 10 and 95 th percentile CPAP 13 cm H2O.  10/02/2019 CTA Chest  No CT imaging evidence of acute pulmonary thromboembolic disease to the level of the segmental pulmonary arteries.  Pulmonary mosaicism, partially accentuated by incidental expiratory phase images with diffuse airway wall thickening, may favor small airway disease. No definite pneumonia. Similar cirrhotic liver morphology with trace perihepatic ascites  10/28/2019 Follow up : Dyspnea and OSA  Patient returns for follow-up.  Patient was seen initially at the ER at [facility] on October 02, 2019 for cough, shortness of breath and wheezing.  CT chest was negative for PE.  There was no acute pneumonia.  There was a mosaic attenuation noted.  COVID-19 testing was negative.  Influenza was negative.  Lab work was unrevealing with a normal WBC and hemoglobin. Patient says she continued to have ongoing symptoms was seen by Eric Form nurse practitioner.  Given a prednisone taper for asthmatic bronchitis.  Patient also complained of tachycardia.  She was set up for a 2D echo that showed preserved EF.  Grade 1 diastolic dysfunction.  And mild pulmonary hypertension with RVSP at 32 mmHg. Patient says she is feeling some better cough and congestion have decreased but she does  have some intermittent wheezing and dry cough.  This seems to be continuing for the last 4 weeks and not going away.  She denies any chest pain orthopnea PND.  Has some mild ankle swelling. Chest x-ray today shows no sign of pneumonia. Patient has underlying obstructive sleep apnea says she wears her CPAP every night.  Download shows excellent compliance with 100% usage.  AHI 0.8.    Allergies  Allergen Reactions  . Ace Inhibitors Swelling  . Asa [Aspirin]     Ulcer   . Latex   . Morphine And Related     SOB   . Neosporin [Bacitracin-Polymyxin B]     Swelling where applied   . Tylenol [Acetaminophen]     Ulcer     Immunization History  Administered Date(s) Administered  . Fluad Quad(high Dose 65+) 10/28/2019  . Influenza Split 08/08/2017  . Influenza, High Dose Seasonal PF 09/18/2014, 07/22/2017, 08/24/2018  . Pneumococcal Conjugate-13 08/08/2017    Past Medical History:  Diagnosis Date  . Chronic back pain   . 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 Depression   . Diabetes mellitus ([place])   . Eczema   . Fatty liver   . GERD (gastroesophageal reflux disease)   . Gout   . Hyperlipidemia   . Hypertension   . Migraine   . OSA (obstructive sleep apnea)   . Peripheral neuropathy   . Restless leg syndrome   . Rosacea   . Seborrheic dermatitis     Tobacco History:  Social History   Tobacco Use  Smoking Status Never Smoker  Smokeless Tobacco Never Used   Counseling given: Not Answered   Outpatient Medications Prior to Visit  Medication Sig Dispense Refill  . allopurinol (ZYLOPRIM) 300 MG tablet Take 300 mg by mouth daily.    Marland Kitchen amLODipine (NORVASC) 5 MG tablet Take 5 mg by mouth 2 (two) times daily.    . diclofenac sodium (VOLTAREN) 1 % GEL Apply 1 application topically daily as needed for pain. Right great toe    . diphenhydrAMINE (BENADRYL) 50 MG capsule Take 1 capsule (50 mg total) by mouth at bedtime as needed for sleep. 30 capsule 0  . DULoxetine  (CYMBALTA) 60 MG capsule Take 60 mg by mouth daily.    . fluticasone (FLONASE) 50 MCG/ACT nasal spray Place 1 spray into both nostrils daily.    . furosemide (LASIX) 20 MG tablet Take 20 mg by mouth.    . gabapentin (NEURONTIN) 100 MG capsule Take 400 mg by mouth 2 (two) times daily.    Marland Kitchen glipiZIDE (GLUCOTROL) 5 MG tablet Take 5 mg by mouth daily before breakfast.    . guaiFENesin (MUCINEX) 600 MG 12 hr tablet Take 1 tablet (600 mg total) by mouth 2 (two) times daily as needed for cough or to loosen phlegm.    . iron polysaccharides (NIFEREX) 150 MG capsule Take 150 mg by mouth daily.    Marland Kitchen lisinopril (PRINIVIL,ZESTRIL) 10 MG tablet Take 10 mg by mouth daily.    Marland Kitchen LORazepam (ATIVAN) 0.5 MG tablet Take 1 mg by mouth 3 times/day as needed-between meals & bedtime for sleep.     . meclizine (ANTIVERT) 12.5 MG tablet Take 12.5 mg by mouth 3 (three) times daily as needed for dizziness.    . Multiple Vitamins-Minerals (MULTIVITAMIN WITH MINERALS) tablet Take 1 tablet by mouth daily.    . nilotinib (TASIGNA) 200 MG capsule Take 200 mg by mouth every 12 (twelve) hours. Give on an empty stomach 1 hour before or 2 hours after meals.    Marland Kitchen omeprazole (PRILOSEC) 40 MG capsule Take 40 mg by mouth daily.    . potassium chloride (KLOR-CON) 20 MEQ packet Take by mouth 2 (two) times daily.    . pravastatin (PRAVACHOL) 10 MG tablet Take 10 mg by mouth daily.    Marland Kitchen rOPINIRole (REQUIP) 1 MG tablet Take 4 mg by mouth at bedtime.     Marland Kitchen tiZANidine (ZANAFLEX) 4 MG capsule Take 4 mg by mouth at bedtime.     . traMADol (ULTRAM) 50 MG tablet Take by mouth every 6 (six) hours as needed.     No facility-administered medications prior to visit.      Review of Systems:   Constitutional:   No  weight loss, night sweats,  Fevers, chills, fatigue, or  lassitude.  HEENT:   No headaches,  Difficulty swallowing,  Tooth/dental problems, or  Sore throat,                No sneezing, itching, ear ache,  +nasal congestion, post  nasal drip,   CV:  No chest pain,  Orthopnea, PND, swelling in lower extremities, anasarca, dizziness, palpitations, syncope.   GI  No heartburn, indigestion, abdominal pain, nausea, vomiting, diarrhea, change in bowel habits, loss of appetite, bloody stools.   Resp:    No wheezing.  No chest wall deformity  Skin: no rash or lesions.  GU: no dysuria, change in color of urine, no urgency or frequency.  No flank  pain, no hematuria   MS:  No joint pain or swelling.  No decreased range of motion.  No back pain.    Physical Exam  BP 116/70 (BP Location: Left Arm, Cuff Size: Normal)   Pulse 77   Temp 97.6 F (36.4 C) (Temporal)   SpO2 97% Comment: RA  GEN: A/Ox3; pleasant , NAD, well nourished , elderly    HEENT:  [place]/AT,    NOSE-clear, THROAT-clear, no lesions, no postnasal drip or exudate noted.   NECK:  Supple w/ fair ROM; no JVD; normal carotid impulses w/o bruits; no thyromegaly or nodules palpated; no lymphadenopathy.    RESP  Clear  P & A; w/o, wheezes/ rales/ or rhonchi. no accessory muscle use, no dullness to percussion  CARD:  RRR, no m/r/g, tr  peripheral edema, pulses intact, no cyanosis or clubbing.  GI:   Soft & nt; nml bowel sounds; no organomegaly or masses detected.   Musco: Warm bil, no deformities or joint swelling noted.   Neuro: alert, no focal deficits noted.    Skin: Warm, no lesions or rashes    Lab Results:  CBC  BNP No results found for: BNP  ProBNP No results found for: PROBNP  Imaging: Dg Chest 2 View  Result Date: 10/28/2019 CLINICAL DATA:  Evaluation for shortness of breath EXAM: CHEST - 2 VIEW COMPARISON:  October 02, 2019 FINDINGS: The mediastinal contour and cardiac silhouette are stable. Prominent central pulmonary vasculature is stable. There is no focal infiltrate, pulmonary edema, or pleural effusion. No acute abnormalities identified in the bony structures. IMPRESSION: No active cardiopulmonary disease. Electronically Signed    By: Abelardo Diesel M.D.   On: 10/28/2019 11:20      No flowsheet data found.  No results found for: NITRICOXIDE      Assessment & Plan:   Acute bronchitis Slowly resolving bronchitis versus viral pneumonia with suspected post viral cough. CT chest with no acute consolidation or PE.  There was a mosaic attenuation possibly with associated bronchitis and/or viral pneumonia.  Patient is clinically improving however continues to have a ongoing cough and intermittent wheezing.  Suspect ACE inhibitor may be aggravating this.  Will hold on additional steroids at this time.  Treat symptoms conservatively.  May use ProAir if needed.  Consider changing ACE inhibitor if able. Set up for PFTs on return  Plan  Patient Instructions  Discuss with Primary MD that Lisinopril may be aggravating your cough .  Continue on Lasix 20mg  daily .  Low salt diet  Keep legs elevated.  Refer to Cardiology  Continue on CPAP At bedtime   Saline nasal rinses As needed   Saline nasal gel At bedtime   Advance activity as tolerated.  ProAir Inhaler 1-2 puffs every 4hrs as needed As needed   Follow up with Dr. Halford Chessman  Or  NP in 4 weeks with PFT and As needed   Please contact office for sooner follow up if symptoms do not improve or worsen or seek emergency care '      Racing heart beat Patient with complaints of tachycardia and pounding heartbeat.  2D echo was unrevealing with mild diastolic dysfunction.  EF was preserved.   With patient's ongoing symptoms will refer to cardiology for further evaluation as she has increased cardiovascular risk factors  Plan  Patient Instructions  Discuss with Primary MD that Lisinopril may be aggravating your cough .  Continue on Lasix 20mg  daily .  Low salt diet  Keep legs elevated.  Refer to Cardiology  Continue on CPAP At bedtime   Saline nasal rinses As needed   Saline nasal gel At bedtime   Advance activity as tolerated.  ProAir Inhaler 1-2 puffs every 4hrs  as needed As needed   Follow up with Dr. Halford Chessman  Or  NP in 4 weeks with PFT and As needed   Please contact office for sooner follow up if symptoms do not improve or worsen or seek emergency care '      OSA on CPAP Excellent control and compliance on CPAP no changes     Rexene Edison, NP 10/28/2019

## 2019-10-28 NOTE — Assessment & Plan Note (Signed)
Slowly resolving bronchitis versus viral pneumonia with suspected post viral cough. CT chest with no acute consolidation or PE.  There was a mosaic attenuation possibly with associated bronchitis and/or viral pneumonia.  Patient is clinically improving however continues to have a ongoing cough and intermittent wheezing.  Suspect ACE inhibitor may be aggravating this.  Will hold on additional steroids at this time.  Treat symptoms conservatively.  May use ProAir if needed.  Consider changing ACE inhibitor if able. Set up for PFTs on return  Plan  Patient Instructions  Discuss with Primary MD that Lisinopril may be aggravating your cough .  Continue on Lasix 20mg  daily .  Low salt diet  Keep legs elevated.  Refer to Cardiology  Continue on CPAP At bedtime   Saline nasal rinses As needed   Saline nasal gel At bedtime   Advance activity as tolerated.  ProAir Inhaler 1-2 puffs every 4hrs as needed As needed   Follow up with Dr. Halford Chessman  Or [place] NP in 4 weeks with PFT and As needed   Please contact office for sooner follow up if symptoms do not improve or worsen or seek emergency care '

## 2019-10-28 NOTE — Assessment & Plan Note (Signed)
Excellent control and compliance on CPAP no changes

## 2019-10-28 NOTE — Patient Instructions (Addendum)
Discuss with Primary MD that Lisinopril may be aggravating your cough .  Continue on Lasix 20mg  daily .  Low salt diet  Keep legs elevated.  Refer to Cardiology  Continue on CPAP At bedtime   Saline nasal rinses As needed   Saline nasal gel At bedtime   Advance activity as tolerated.  ProAir Inhaler 1-2 puffs every 4hrs as needed As needed   Follow up with Dr. Halford Chessman  Or Neomi Laidler NP in 4 weeks with PFT and As needed   Please contact office for sooner follow up if symptoms do not improve or worsen or seek emergency care '

## 2019-10-29 ENCOUNTER — Inpatient Hospital Stay (HOSPITAL_COMMUNITY): Admission: RE | Admit: 2019-10-29 | Payer: Medicare Other | Source: Ambulatory Visit

## 2019-10-29 NOTE — Progress Notes (Signed)
Reviewed and agree with assessment/plan.   Calven Gilkes, MD [facility] 11/19/2016, 12:24 PM Pager:  [phone]  

## 2019-10-31 ENCOUNTER — Other Ambulatory Visit (HOSPITAL_COMMUNITY)
Admission: RE | Admit: 2019-10-31 | Discharge: 2019-10-31 | Disposition: A | Discharge status: Home / Self Care | Payer: Medicare Other | Source: Ambulatory Visit | Attending: Adult Health | Admitting: Adult Health

## 2019-10-31 DIAGNOSIS — Z20828 Contact with and (suspected) exposure to other viral communicable diseases: Secondary | ICD-10-CM | POA: Insufficient documentation

## 2019-10-31 DIAGNOSIS — Z01812 Encounter for preprocedural laboratory examination: Secondary | ICD-10-CM | POA: Insufficient documentation

## 2019-10-31 LAB — SARS CORONAVIRUS 2 (TAT 6-24 HRS): SARS Coronavirus 2: NEGATIVE

## 2019-10-31 NOTE — Telephone Encounter (Signed)
Pt can go for covid test today and since it is for a procedure the result MAY be back in time for pft tomorrow but we won't know.  She can go for test and if result isn't back she will need to reschedule pft.  Please send pt message back thru her MyChart to make her aware.

## 2019-10-31 NOTE — Telephone Encounter (Signed)
Message sent to TP already and awaiting response.

## 2019-10-31 NOTE — Telephone Encounter (Signed)
The PFT has to be rescheduled because it has to be done 3 days before the test to have the results back in time. TP I can advise pt but please advise on infected ear lobe/abx question. Thanks.

## 2019-10-31 NOTE — Telephone Encounter (Signed)
Patient was originally scheduled for a covid test on 12/5 but the appointment was cancelled per the patient by [facility]. She was rescheduled for today at 11am but she has a PFT tomorrow at 3pm. I'm thinking she will need to reschedule her PFT but I wanted to check with the PCCs first.   PCCs, please advise. Thanks!

## 2019-11-14 ENCOUNTER — Encounter: Payer: Self-pay | Admitting: Adult Health

## 2019-11-14 ENCOUNTER — Ambulatory Visit (INDEPENDENT_AMBULATORY_CARE_PROVIDER_SITE_OTHER): Payer: Medicare Other | Admitting: Adult Health

## 2019-11-14 ENCOUNTER — Other Ambulatory Visit: Payer: Self-pay

## 2019-11-14 DIAGNOSIS — R0602 Shortness of breath: Secondary | ICD-10-CM

## 2019-11-14 DIAGNOSIS — J209 Acute bronchitis, unspecified: Secondary | ICD-10-CM | POA: Diagnosis not present

## 2019-11-14 DIAGNOSIS — Z9989 Dependence on other enabling machines and devices: Secondary | ICD-10-CM

## 2019-11-14 DIAGNOSIS — G4733 Obstructive sleep apnea (adult) (pediatric): Secondary | ICD-10-CM | POA: Diagnosis not present

## 2019-11-14 MED ORDER — BUDESONIDE-FORMOTEROL FUMARATE 160-4.5 MCG/ACT IN AERO: 2.0000 | INHALATION_SPRAY | Freq: Two times a day (BID) | RESPIRATORY_TRACT | 5 refills | Status: DC

## 2019-11-14 MED ORDER — PREDNISONE 10 MG PO TABS: ORAL_TABLET | ORAL | 0 refills | Status: DC

## 2019-11-14 NOTE — Progress Notes (Signed)
Reviewed and agree with assessment/plan.   Armstrong Creasy, MD  Pulmonary/Critical Care 11/19/2016, 12:24 PM Pager:  [phone]  

## 2019-11-14 NOTE — Progress Notes (Signed)
Virtual Visit via Telephone Note  I connected with Wanda Hanson on 11/14/19 at 10:00 AM EST by telephone and verified that I am speaking with the correct person using two identifiers.  Location: Patient: Home  Provider: Office    I discussed the limitations, risks, security and privacy concerns of performing an evaluation and management service by telephone and the availability of in person appointments. I also discussed with the patient that there may be a patient responsible charge related to this service. The patient expressed understanding and agreed to proceed.   History of Present Illness: 73 year old female never smoker followed for obstructive sleep apnea on nocturnal CPAP Medical history significant for bilateral pneumonia in 2018, CML, diabetes, hypertension  Today's televisit is an acute office visit for shortness of breath and wheezing.  Patient complains over the last few days she has had increased cough and wheezing.  She is getting more short of breath with activity.  This has been an ongoing problem patient says she did feel better after taking the Z-Pak and prednisone however over the last week symptoms have been slowly returning.  She denies any chest pain, orthopnea, increased leg swelling hemoptysis fever loss of taste or smell.  No known sick contacts. Last visit she was recommended to change lisinopril if possible.  She says she is recently been changed from lisinopril to losartan. Appetite is fair.  No nausea vomiting or diarrhea.  Patient was seen in the emergency room at [facility] on October 02, 2019 for cough shortness of breath and wheezing.  CT was negative for PE.  There was no acute pneumonia.  There was a mosaic attenuation noted.  COVID-19 testing was negative.  Influenza was negative.  Lab work was unrevealing with normal WBC and hemoglobin.  Patient continued to have ongoing symptoms and was seen via telemedicine for Asthmatic Bronchitis , treated with  prednisone taper and zpack . 2 D echo showed preserved EF . Gr 1 DD . Mild Pulmonary HTN with RVSP at 55mmHg .  She was referred to cardiology for ongoing shortness of breath.. She remains on Lasix 20 mg daily.  Denies any increased leg swelling.     Observations/Objective: Auto CPAP 10/10/17 to 01/07/18 >>used on 78 of 90 nights with average 7 hrs 5 min. Average AHI 0.5 with median CPAP 9 and 95 th percentile CPAP 11 cm H2O. HST 01/31/18 >> AHI 7.4, SaO2 low 80%. AutoCPAP 09/27/19 to 10/26/19 >>used on 30 of 30 nights with average 7 hrs 58 min. Average AHI 0.8 with median CPAP 10 and 95 th percentile CPAP 13 cm H2O.  10/02/2019 CTA Chest No CT imaging evidence of acute pulmonary thromboembolic disease to the level of the segmental pulmonary arteries.  Pulmonary mosaicism, partially accentuated by incidental expiratory phase images with diffuse airway wall thickening, may favor small airway disease. No definite pneumonia. Similar cirrhotic liver morphology with trace perihepatic ascites   Assessment and Plan: Ongoing cough and wheezing/slow to resolve asthmatic bronchitis.  CT chest was negative for PE.  Follow-up chest x-ray showed no acute process. We will begin Symbicort 162 puffs twice daily for possible asthma component.  She has PFTs scheduled.  We will give a short course of prednisone.  Patient has albuterol to use as needed. Close follow-up in 1 week.  Of advised patient if symptoms worsen or do not improve she is to seek emergency care  Obstructive sleep apnea.  Controlled on CPAP.  Ongoing shortness of breath with activity/Diastolic Dysfunction on Echo -  Patient is to follow-up with cardiology as planned next week.  Plan  Patient Instructions  Begin Prednisone 20mg  daily for 5 days .  Begin Symbicort 80 2 puffs Twice daily  , rinse after use.   Continue on Lasix 20mg  daily .  Low salt diet  Keep legs elevated.  Follow up with Cardiology next week as planned.   Continue on CPAP At bedtime   Saline nasal rinses As needed   Saline nasal gel At bedtime   Advance activity as tolerated.  ProAir Inhaler 1-2 puffs every 4hrs as needed As needed   Follow up next week as planned and As needed   and As needed   Please contact office for sooner follow up if symptoms do not improve or worsen or seek emergency care '      Follow Up Instructions:   Follow-up in 1 week and as needed   I discussed the assessment and treatment plan with the patient. The patient was provided an opportunity to ask questions and all were answered. The patient agreed with the plan and demonstrated an understanding of the instructions.   The patient was advised to call back or seek an in-person evaluation if the symptoms worsen or if the condition fails to improve as anticipated.  I provided 25 minutes of non-face-to-face time during this encounter.   Rexene Edison, NP

## 2019-11-14 NOTE — Patient Instructions (Addendum)
Begin Prednisone 20mg  daily for 5 days .  Begin Symbicort 160 2 puffs Twice daily  , rinse after use.  Continue on Lasix 20mg  daily .  Low salt diet  Keep legs elevated.  Follow up with Cardiology next week as planned.  Continue on CPAP At bedtime   Saline nasal rinses As needed   Saline nasal gel At bedtime   Advance activity as tolerated.  ProAir Inhaler 1-2 puffs every 4hrs as needed As needed   Follow up next week as planned and As needed   and As needed   Please contact office for sooner follow up if symptoms do not improve or worsen or seek emergency care '

## 2019-11-22 ENCOUNTER — Encounter: Payer: Self-pay | Admitting: Cardiovascular Disease

## 2019-11-22 ENCOUNTER — Ambulatory Visit: Payer: Medicare Other | Admitting: Cardiovascular Disease

## 2019-11-22 ENCOUNTER — Other Ambulatory Visit: Payer: Self-pay

## 2019-11-22 VITALS — BP 118/65 | HR 76 | Temp 97.7°F | Ht 60.0 in | Wt 206.8 lb

## 2019-11-22 DIAGNOSIS — R0602 Shortness of breath: Secondary | ICD-10-CM

## 2019-11-22 DIAGNOSIS — R931 Abnormal findings on diagnostic imaging of heart and coronary circulation: Secondary | ICD-10-CM | POA: Diagnosis not present

## 2019-11-22 DIAGNOSIS — R600 Localized edema: Secondary | ICD-10-CM

## 2019-11-22 DIAGNOSIS — I10 Essential (primary) hypertension: Secondary | ICD-10-CM

## 2019-11-22 DIAGNOSIS — I2510 Atherosclerotic heart disease of native coronary artery without angina pectoris: Secondary | ICD-10-CM | POA: Insufficient documentation

## 2019-11-22 MED ORDER — LOSARTAN POTASSIUM 100 MG PO TABS: 100.0000 mg | ORAL_TABLET | Freq: Every day | ORAL | 3 refills | Status: DC

## 2019-11-22 NOTE — Assessment & Plan Note (Signed)
On CPAP. ?

## 2019-11-22 NOTE — Assessment & Plan Note (Signed)
Bilateral lower extremity edema which is somewhat dependent.  She was put on low-dose furosemide which has not helped.  It is possible that amlodipine is contributory.  I am going to stop the amlodipine, increase losartan and increase her diuretic.  A 2D echocardiogram performed 10/27/2019 revealed normal LV systolic function with grade 1 diastolic dysfunction.

## 2019-11-22 NOTE — Assessment & Plan Note (Signed)
History of coronary calcification described as moderate seen on CTA performed at [facility] 10/03/2019.  Go to get an official coronary calcium score to further quantitate

## 2019-11-22 NOTE — Assessment & Plan Note (Signed)
History of essential hypertension with blood pressure measured today 118/65.  She is on amlodipine and losartan.  She has significant lower extremity edema.  Ongoing to stop her amlodipine and increase her losartan.

## 2019-11-22 NOTE — Progress Notes (Signed)
11/22/2019 Wanda Hanson   1946/06/04  [id number]  Primary Physician Parke Poisson, MD Primary Cardiologist: Lorretta Harp MD Wanda Hanson, [place]  HPI:  Wanda Hanson is a 73 y.o. severely overweight married Caucasian female mother of 3 children who has worked as a Network engineer in the past.  She was referred by Wanda Hanson , NP because of shortness of breath.  She has never smoked.  There is no family history for heart disease.  She is never had a heart attack or stroke.  She does have hypertension and diabetes as well as obstructive sleep apnea on CPAP.  She is noticed increased shortness of breath over the last month or so as well as lower extremity edema which is dependent.  She did have a chest CTA [facility] 10/03/2019 that showed no evidence of pulmonary embolus with moderate coronary artery calcification.  2D echo performed 10/18/2019 revealed normal LV systolic function with grade 1 diastolic dysfunction.  She was placed on low-dose furosemide which is not improved her edema.   No outpatient medications have been marked as taking for the 11/22/19 encounter (Office Visit) with Lorretta Harp, MD.     Allergies  Allergen Reactions  . Cortisone Swelling  . Pollen Extract Other (See Comments)    Typical "Hay Fever" symptoms Stuffy nose  . Ace Inhibitors Swelling  . Asa [Aspirin]     Ulcer   . Latex   . Morphine And Related     SOB   . Neosporin [Bacitracin-Polymyxin B]     Swelling where applied   . Tylenol [Acetaminophen]     Ulcer     Social History   Socioeconomic History  . Marital status: Married    Spouse name: Not on file  . Number of children: Not on file  . Years of education: Not on file  . Highest education level: Not on file  Occupational History  . Not on file  Tobacco Use  . Smoking status: Never Smoker  . Smokeless tobacco: Never Used  Substance and Sexual Activity  . Alcohol use: No  . Drug use: No    . Sexual activity: Not on file  Other Topics Concern  . Not on file  Social History Narrative  . Not on file   Social Determinants of Health   Financial Resource Strain:   . Difficulty of Paying Living Expenses: Not on file  Food Insecurity:   . Worried About Charity fundraiser in the Last Year: Not on file  . Ran Out of Food in the Last Year: Not on file  Transportation Needs:   . Lack of Transportation (Medical): Not on file  . Lack of Transportation (Non-Medical): Not on file  Physical Activity:   . Days of Exercise per Week: Not on file  . Minutes of Exercise per Session: Not on file  Stress:   . Feeling of Stress : Not on file  Social Connections:   . Frequency of Communication with Friends and Family: Not on file  . Frequency of Social Gatherings with Friends and Family: Not on file  . Attends Religious Services: Not on file  . Active Member of Clubs or Organizations: Not on file  . Attends Archivist Meetings: Not on file  . Marital Status: Not on file  Intimate Partner Violence:   . Fear of Current or Ex-Partner: Not on file  . Emotionally Abused: Not on file  . Physically Abused:  Not on file  . Sexually Abused: Not on file     Review of Systems: General: negative for chills, fever, night sweats or weight changes.  Cardiovascular: negative for chest pain, dyspnea on exertion, edema, orthopnea, palpitations, paroxysmal nocturnal dyspnea or shortness of breath Dermatological: negative for rash Respiratory: negative for cough or wheezing Urologic: negative for hematuria Abdominal: negative for nausea, vomiting, diarrhea, bright red blood per rectum, melena, or hematemesis Neurologic: negative for visual changes, syncope, or dizziness All other systems reviewed and are otherwise negative except as noted above.    Blood pressure 118/65, pulse 76, temperature 97.7 F (36.5 C), height 5' (1.524 m), weight 206 lb 12.8 oz (93.8 kg), SpO2 98 %.  General  appearance: alert and no distress Neck: no adenopathy, no carotid bruit, no JVD, supple, symmetrical, trachea midline and thyroid not enlarged, symmetric, no tenderness/mass/nodules Lungs: clear to auscultation bilaterally Heart: regular rate and rhythm, S1, S2 normal, no murmur, click, rub or gallop Extremities: 2+ bilateral lower extremity edema Pulses: 2+ and symmetric Skin: Skin color, texture, turgor normal. No rashes or lesions Neurologic: Alert and oriented X 3, normal strength and tone. Normal symmetric reflexes. Normal coordination and gait  EKG sinus rhythm at 79 without ST or T wave changes.  Personally reviewed this EKG.  ASSESSMENT AND PLAN:   Essential hypertension History of essential hypertension with blood pressure measured today 118/65.  She is on amlodipine and losartan.  She has significant lower extremity edema.  Ongoing to stop her amlodipine and increase her losartan.  OSA on CPAP On CPAP  Coronary artery calcification seen on CT scan History of coronary calcification described as moderate seen on CTA performed at [facility] 10/03/2019.  Go to get an official coronary calcium score to further quantitate  Lower extremity edema Bilateral lower extremity edema which is somewhat dependent.  She was put on low-dose furosemide which has not helped.  It is possible that amlodipine is contributory.  I am going to stop the amlodipine, increase losartan and increase her diuretic.  A 2D echocardiogram performed 10/27/2019 revealed normal LV systolic function with grade 1 diastolic dysfunction.  Shortness of breath Increase shortness of breath over the last month.  She is followed by Dr. Halford Chessman.  She is on inhaled bronchodilators.  Her 2D echo revealed normal LV function with grade 1 diastolic dysfunction.  It is unclear the etiology.  She has had bilateral pneumonia in the past.  Her pulmonary pressures are not increased.      Lorretta Harp MD  FACP,FACC,FAHA, [facility] 11/22/2019 3:11 PM

## 2019-11-22 NOTE — Assessment & Plan Note (Signed)
Increase shortness of breath over the last month.  She is followed by Dr. Halford Chessman.  She is on inhaled bronchodilators.  Her 2D echo revealed normal LV function with grade 1 diastolic dysfunction.  It is unclear the etiology.  She has had bilateral pneumonia in the past.  Her pulmonary pressures are not increased.

## 2019-11-22 NOTE — Patient Instructions (Signed)
Medication Instructions:  Stop taking Amlodipine. Increase Losartan to 100mg  Daily. Increase Lasix to 40mg  Daily for 1 week and then go back to 20mg .  If you need a refill on your cardiac medications before your next appointment, please call your pharmacy.   Lab work: BMET in 2 weeks If you have labs (blood work) drawn today and your tests are completely normal, you will receive your results only by: [place] (if you have MyChart) OR A paper copy in the mail If you have any lab test that is abnormal or we need to change your treatment, we will call you to review the results.  Testing/Procedures: Coronary Calcium Score  Follow-Up: At [facility], you and your health needs are our priority.  As part of our continuing mission to provide you with exceptional heart care, we have created designated Provider Care Teams.  These Care Teams include your primary Cardiologist (physician) and Advanced Practice Providers (APPs -  Physician Assistants and Nurse Practitioners) who all work together to provide you with the care you need, when you need it. You may see Dr Gwenlyn Found or one of the following Advanced Practice Providers on your designated Care Team:    Kerin Ransom, PA-C  [place], [place]  Coletta Memos, [place] Your physician wants you to follow-up in: 1 month   Any Other Special Instructions Will Be Listed Below (If Applicable).    Coronary Calcium Scan A coronary calcium scan is an imaging test used to look for deposits of calcium and other fatty materials (plaques) in the inner lining of the blood vessels of the heart (coronary arteries). These deposits of calcium and plaques can partly clog and narrow the coronary arteries without producing any symptoms or warning signs. This puts a person at risk for a heart attack. This test can detect these deposits before symptoms develop. Tell a health care provider about:  Any allergies you have.  All medicines you are taking,  including vitamins, herbs, eye drops, creams, and over-the-counter medicines.  Any problems you or family members have had with anesthetic medicines.  Any blood disorders you have.  Any surgeries you have had.  Any medical conditions you have.  Whether you are pregnant or may be pregnant. What are the risks? Generally, this is a safe procedure. However, problems may occur, including:  Harm to a pregnant woman and her unborn baby. This test involves the use of radiation. Radiation exposure can be dangerous to a pregnant woman and her unborn baby. If you are pregnant, you generally should not have this procedure done.  Slight increase in the risk of cancer. This is because of the radiation involved in the test. What happens before the procedure? No preparation is needed for this procedure. What happens during the procedure?   You will undress and remove any jewelry around your neck or chest.  You will put on a hospital gown.  Sticky electrodes will be placed on your chest. The electrodes will be connected to an electrocardiogram (ECG) machine to record a tracing of the electrical activity of your heart.  A CT scanner will take pictures of your heart. During this time, you will be asked to lie still and hold your breath for 2-3 seconds while a picture of your heart is being taken. The procedure may vary among health care providers and hospitals. What happens after the procedure?  You can get dressed.  You can return to your normal activities.  It is up to you to get the  results of your test. Ask your health care provider, or the department that is doing the test, when your results will be ready. Summary  A coronary calcium scan is an imaging test used to look for deposits of calcium and other fatty materials (plaques) in the inner lining of the blood vessels of the heart (coronary arteries).  Generally, this is a safe procedure. Tell your health care provider if you are pregnant  or may be pregnant.  No preparation is needed for this procedure.  A CT scanner will take pictures of your heart.  You can return to your normal activities after the scan is done. This information is not intended to replace advice given to you by your health care provider. Make sure you discuss any questions you have with your health care provider. Document Released: 05/08/2008 Document Revised: 10/23/2017 Document Reviewed: 09/29/2016 Elsevier Patient Education  2020 [employer].

## 2019-11-23 ENCOUNTER — Ambulatory Visit: Payer: Medicare Other | Admitting: Adult Health

## 2019-11-23 ENCOUNTER — Encounter: Payer: Self-pay | Admitting: Adult Health

## 2019-11-23 DIAGNOSIS — G4733 Obstructive sleep apnea (adult) (pediatric): Secondary | ICD-10-CM | POA: Diagnosis not present

## 2019-11-23 DIAGNOSIS — J209 Acute bronchitis, unspecified: Secondary | ICD-10-CM | POA: Diagnosis not present

## 2019-11-23 DIAGNOSIS — Z9989 Dependence on other enabling machines and devices: Secondary | ICD-10-CM | POA: Diagnosis not present

## 2019-11-23 NOTE — Progress Notes (Signed)
@Patient  ID: Wanda Hanson, female    DOB: Dec 03, 1945, 73 y.o.   MRN: [id number]    Referring provider: Parke Poisson, MD  HPI: 73 year old female never smoker followed for obstructive sleep apnea on nocturnal CPAP Medical history significant for bilateral pneumonia in 2018, CML, diabetes and hypertension  TEST/EVENTS :  Auto CPAP 10/10/17 to 01/07/18 >>used on 78 of 90 nights with average 7 hrs 5 min. Average AHI 0.5 with median CPAP 9 and 95 th percentile CPAP 11 cm H2O. HST 01/31/18 >> AHI 7.4, SaO2 low 80%. AutoCPAP 09/27/19 to 10/26/19 >>used on 30 of 30 nights with average 7 hrs 58 min. Average AHI 0.8 with median CPAP 10 and 95 th percentile CPAP 13 cm H2O.  10/02/2019 CTA Chest No CT imaging evidence of acute pulmonary thromboembolic disease to the level of the segmental pulmonary arteries.  Pulmonary mosaicism, partially accentuated by incidental expiratory phase images with diffuse airway wall thickening, may favor small airway disease. No definite pneumonia. Similar cirrhotic liver morphology with trace perihepatic ascites  11/24/2019 Follow up : Asthmatic bronchitis Patient returns for 2-week follow-up.  She was seen last visit for suspected slow to resolve asthmatic bronchitis.  She was initially treated the beginning of November for an acute bronchitis.  CT chest was negative for PE.  CT chest did show a mosaic attenuation.  COVID-19 testing was negative.  Influenza and was negative.  Lab work was unrevealing with a normal WBC and hemoglobin.  Patient continued to have ongoing symptoms with asthmatic bronchitis and cough.  She was treated with a prednisone taper and a Z-Pak.  2D echo was done and showed a preserved EF grade 1 diastolic dysfunction and mild pulmonary hypertension with RVSP at 32 mmHg. Patient was referred to cardiology and was seen by Dr. Alvester Chou yesterday.  She was discontinued off her amlodipine.  Set up for a cardiac CT.  And Lasix was  increased. Patient says since last visit she is starting to feel better.  Cough and congestion have decreased.  Shortness of breath is still present but not as pronounced. Patient was started on Symbicort last visit.  Patient says she does feel like that is helping.   Patient is on nocturnal CPAP for her sleep apnea.  Says she is doing well.  Wears it each night. Allergies  Allergen Reactions  . Cortisone Swelling  . Pollen Extract Other (See Comments)    Typical "Hay Fever" symptoms Stuffy nose  . Ace Inhibitors Swelling  . Asa [Aspirin]     Ulcer   . Latex   . Morphine And Related     SOB   . Neosporin [Bacitracin-Polymyxin B]     Swelling where applied   . Tylenol [Acetaminophen]     Ulcer     Immunization History  Administered Date(s) Administered  . Fluad Quad(high Dose 65+) 10/28/2019  . Influenza Split 08/08/2017  . Influenza, High Dose Seasonal PF 09/18/2014, 07/22/2017, 08/24/2018  . Pneumococcal Conjugate-13 08/08/2017    Past Medical History:  Diagnosis Date  . Chronic back pain   . 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 Depression   . Diabetes mellitus ([place])   . Eczema   . Fatty liver   . GERD (gastroesophageal reflux disease)   . Gout   . Hyperlipidemia   . Hypertension   . Migraine   . OSA (obstructive sleep apnea)   . Peripheral neuropathy   . Restless leg syndrome   . Rosacea   .  Seborrheic dermatitis     Tobacco History: Social History   Tobacco Use  Smoking Status Never Smoker  Smokeless Tobacco Never Used   Counseling given: Not Answered   Outpatient Medications Prior to Visit  Medication Sig Dispense Refill  . albuterol (PROAIR HFA) 108 (90 Base) MCG/ACT inhaler Inhale 2 puffs into the lungs every 4 (four) hours as needed for wheezing or shortness of breath. 8 g 5  . allopurinol (ZYLOPRIM) 300 MG tablet Take 300 mg by mouth daily.    . budesonide-formoterol (SYMBICORT) 160-4.5 MCG/ACT inhaler Inhale 2 puffs into the lungs  2 (two) times daily. 1 Inhaler 5  . diclofenac sodium (VOLTAREN) 1 % GEL Apply 1 application topically daily as needed for pain. Right great toe    . diphenhydrAMINE (BENADRYL) 50 MG capsule Take 1 capsule (50 mg total) by mouth at bedtime as needed for sleep. 30 capsule 0  . DULoxetine (CYMBALTA) 60 MG capsule Take 60 mg by mouth daily.    . fluticasone (FLONASE) 50 MCG/ACT nasal spray Place 1 spray into both nostrils daily.    . furosemide (LASIX) 20 MG tablet Take 20 mg by mouth.    . gabapentin (NEURONTIN) 100 MG capsule Take 400 mg by mouth 2 (two) times daily.    Marland Kitchen glipiZIDE (GLUCOTROL) 5 MG tablet Take 5 mg by mouth daily before breakfast.    . guaiFENesin (MUCINEX) 600 MG 12 hr tablet Take 1 tablet (600 mg total) by mouth 2 (two) times daily as needed for cough or to loosen phlegm.    . iron polysaccharides (NIFEREX) 150 MG capsule Take 150 mg by mouth daily.    Marland Kitchen LORazepam (ATIVAN) 0.5 MG tablet Take 1 mg by mouth 3 times/day as needed-between meals & bedtime for sleep.     Marland Kitchen losartan (COZAAR) 100 MG tablet Take 1 tablet (100 mg total) by mouth daily. 90 tablet 3  . meclizine (ANTIVERT) 12.5 MG tablet Take 12.5 mg by mouth 3 (three) times daily as needed for dizziness.    . Multiple Vitamins-Minerals (MULTIVITAMIN WITH MINERALS) tablet Take 1 tablet by mouth daily.    . nilotinib (TASIGNA) 200 MG capsule Take 200 mg by mouth every 12 (twelve) hours. Give on an empty stomach 1 hour before or 2 hours after meals.    Marland Kitchen omeprazole (PRILOSEC) 40 MG capsule Take 40 mg by mouth daily.    . potassium chloride (KLOR-CON) 20 MEQ packet Take by mouth 2 (two) times daily.    . pravastatin (PRAVACHOL) 10 MG tablet Take 10 mg by mouth daily.    . predniSONE (DELTASONE) 10 MG tablet 2 tabs daily for 5 days 10 tablet 0  . rOPINIRole (REQUIP) 1 MG tablet Take 4 mg by mouth at bedtime.     Marland Kitchen tiZANidine (ZANAFLEX) 4 MG capsule Take 4 mg by mouth at bedtime.     . traMADol (ULTRAM) 50 MG tablet Take by  mouth every 6 (six) hours as needed.     No facility-administered medications prior to visit.     Review of Systems:   Constitutional:   No  weight loss, night sweats,  Fevers, chills, + fatigue, or  lassitude.  HEENT:   No headaches,  Difficulty swallowing,  Tooth/dental problems, or  Sore throat,                No sneezing, itching, ear ache, nasal congestion, post nasal drip,   CV:  No chest pain,  Orthopnea, PND,  , anasarca, dizziness,  palpitations, syncope.   GI  No heartburn, indigestion, abdominal pain, nausea, vomiting, diarrhea, change in bowel habits, loss of appetite, bloody stools.   Resp:  .  No chest wall deformity  Skin: no rash or lesions.  GU: no dysuria, change in color of urine, no urgency or frequency.  No flank pain, no hematuria   MS:  No joint pain or swelling.  No decreased range of motion.  No back pain.    Physical Exam    GEN: A/Ox3; pleasant , NAD, elderly   HEENT:  [place]/AT,   NOSE-clear, THROAT-clear, no lesions, no postnasal drip or exudate noted.   NECK:  Supple w/ fair ROM; no JVD; normal carotid impulses w/o bruits; no thyromegaly or nodules palpated; no lymphadenopathy.    RESP  Clear  P & A; w/o, wheezes/ rales/ or rhonchi. no accessory muscle use, no dullness to percussion  CARD:  RRR, no m/r/g, 2+ peripheral edema, pulses intact, no cyanosis or clubbing.  GI:   Soft & nt; nml bowel sounds; no organomegaly or masses detected.   Musco: Warm bil, no deformities or joint swelling noted.   Neuro: alert, no focal deficits noted.    Skin: Warm, no lesions or rashes    Lab Results:  CBC   BMET  BNP No results found for: BNP  ProBNP No results found for: PROBNP  Imaging: DG Chest 2 View  Result Date: 10/28/2019 CLINICAL DATA:  Evaluation for shortness of breath EXAM: CHEST - 2 VIEW COMPARISON:  October 02, 2019 FINDINGS: The mediastinal contour and cardiac silhouette are stable. Prominent central pulmonary vasculature is  stable. There is no focal infiltrate, pulmonary edema, or pleural effusion. No acute abnormalities identified in the bony structures. IMPRESSION: No active cardiopulmonary disease. Electronically Signed   By: Abelardo Diesel M.D.   On: 10/28/2019 11:20      No flowsheet data found.  No results found for: NITRICOXIDE      Assessment & Plan:   No problem-specific Assessment & Plan notes found for this encounter.     Rexene Edison, NP 11/23/2019

## 2019-11-23 NOTE — Patient Instructions (Addendum)
Decrease Symbicort 160 1  puffs Twice daily  , rinse after use.  Continue on Lasix 20mg  daily .  Low salt diet  Keep legs elevated.   Continue on CPAP At bedtime   Saline nasal rinses As needed   Saline nasal gel At bedtime   Advance activity as tolerated.  ProAir Inhaler 1-2 puffs every 4hrs as needed As needed   Follow up with Dr. Halford Chessman or Laurita Peron in 4-6 weeks after PFT .  Please contact office for sooner follow up if symptoms do not improve or worsen or seek emergency care

## 2019-11-24 NOTE — Assessment & Plan Note (Signed)
Slow to resolve asthmatic bronchitis.  Patient is clinically improving. We will check PFTs on return. Decrease Symbicort to 1 puff twice daily. Upon return if PFTs are normal and patient symptoms are back to baseline.  Can consider discontinuation of Symbicort and does use Saba as needed only  Plan  Patient Instructions  Decrease Symbicort 160 1  puffs Twice daily  , rinse after use.  Continue on Lasix 20mg  daily .  Low salt diet  Keep legs elevated.   Continue on CPAP At bedtime   Saline nasal rinses As needed   Saline nasal gel At bedtime   Advance activity as tolerated.  ProAir Inhaler 1-2 puffs every 4hrs as needed As needed   Follow up with Dr. Halford Chessman or [place] in 4-6 weeks after PFT .  Please contact office for sooner follow up if symptoms do not improve or worsen or seek emergency care

## 2019-11-24 NOTE — Assessment & Plan Note (Signed)
Continue on CPAP at bedtime 

## 2019-12-07 ENCOUNTER — Other Ambulatory Visit: Payer: Medicare Other

## 2019-12-09 ENCOUNTER — Telehealth: Payer: Self-pay | Admitting: Cardiovascular Disease

## 2019-12-09 MED ORDER — NEBIVOLOL HCL 5 MG PO TABS: 5.0000 mg | ORAL_TABLET | Freq: Every day | ORAL | 3 refills | Status: DC

## 2019-12-09 NOTE — Telephone Encounter (Signed)
Pt called wanting to know if Dr Gwenlyn Found had received her BP log. Advised pt we have received it and Dr Gwenlyn Found has not yet looked at it. Will let her know as soon as he does. Verbalized understanding.

## 2019-12-09 NOTE — Telephone Encounter (Signed)
Called and gave pt Dr. Kennon Holter recommendations. Verbalized understanding.

## 2019-12-09 NOTE — Telephone Encounter (Signed)
Her pulse is in the 80s.  We can add Bystolic 5 mg and have her continue to check her blood pressure.  She can keep a log and come back to see Cyril Mourning in 3 to 4 weeks for follow-up.

## 2019-12-09 NOTE — Telephone Encounter (Signed)
I have looked at and his blood pressure log and her blood pressures are little higher than I feel comfortable with since stopping the amlodipine.  What losartan dose is she currently on.?

## 2019-12-09 NOTE — Telephone Encounter (Signed)
Patient calling stating she sent her BP readings to Dr.Berry and has not yet heard back. She would like a call back.

## 2019-12-15 LAB — BASIC METABOLIC PANEL
BUN/Creatinine Ratio: 13 (ref 12–28)
BUN: 10 mg/dL (ref 8–27)
CO2: 27 mmol/L (ref 20–29)
Calcium: 9.3 mg/dL (ref 8.7–10.3)
Chloride: 99 mmol/L (ref 96–106)
Creatinine, Ser: 0.8 mg/dL (ref 0.57–1.00)
GFR calc Af Amer: 86 mL/min/{1.73_m2} (ref >59)
GFR calc non Af Amer: 74 mL/min/{1.73_m2} (ref >59)
Glucose: 162 mg/dL — ABNORMAL HIGH (ref 65–99)
Potassium: 4 mmol/L (ref 3.5–5.2)
Sodium: 140 mmol/L (ref 134–144)

## 2019-12-20 ENCOUNTER — Telehealth: Payer: Self-pay | Admitting: Cardiovascular Disease

## 2019-12-20 NOTE — Telephone Encounter (Signed)
New Message    Pt is calling and asks for her husband to assist her to her appt tomorrow because he is her caregiver, she says she doesn't understand some things or remember things    Please call

## 2019-12-20 NOTE — Telephone Encounter (Signed)
Returned call to patient, patient request husband come with her as she has memory issues.   Advised this is okay.

## 2019-12-21 ENCOUNTER — Other Ambulatory Visit: Payer: Self-pay

## 2019-12-21 ENCOUNTER — Ambulatory Visit: Payer: Medicare Other | Admitting: Cardiovascular Disease

## 2019-12-21 ENCOUNTER — Encounter: Payer: Self-pay | Admitting: Cardiovascular Disease

## 2019-12-21 VITALS — BP 138/64 | HR 75 | Temp 97.3°F | Ht 60.0 in | Wt 198.0 lb

## 2019-12-21 DIAGNOSIS — I10 Essential (primary) hypertension: Secondary | ICD-10-CM

## 2019-12-21 DIAGNOSIS — I2510 Atherosclerotic heart disease of native coronary artery without angina pectoris: Secondary | ICD-10-CM | POA: Diagnosis not present

## 2019-12-21 DIAGNOSIS — G4733 Obstructive sleep apnea (adult) (pediatric): Secondary | ICD-10-CM

## 2019-12-21 DIAGNOSIS — R600 Localized edema: Secondary | ICD-10-CM | POA: Diagnosis not present

## 2019-12-21 DIAGNOSIS — Z9989 Dependence on other enabling machines and devices: Secondary | ICD-10-CM

## 2019-12-21 NOTE — Patient Instructions (Addendum)
Medication Instructions:  Your physician recommends that you continue on your current medications as directed. Please refer to the Current Medication list given to you today.  If you need a refill on your cardiac medications before your next appointment, please call your pharmacy.   Lab work: Fasting Lipids and Hepatic Function  Testing/Procedures: NONE  Follow-Up: At [facility], you and your health needs are our priority.  As part of our continuing mission to provide you with exceptional heart care, we have created designated Provider Care Teams.  These Care Teams include your primary Cardiologist (physician) and Advanced Practice Providers (APPs -  Physician Assistants and Nurse Practitioners) who all work together to provide you with the care you need, when you need it. You may see Dr. Gwenlyn Found or one of the following Advanced Practice Providers on your designated Care Team:    Kerin Ransom, PA-C  [place], [place]  Coletta Memos,   Your physician wants you to follow-up in: 3 months with a Physicians Assistant  Your physician wants you to follow-up in: 6 months with Dr. Gwenlyn Found

## 2019-12-21 NOTE — Assessment & Plan Note (Signed)
History lower extremity edema and diastolic dysfunction on low-dose diuretic.  She is lost 10 pound since I saw her last.  Her edema is markedly improved.  She is aware of salt restriction.

## 2019-12-21 NOTE — Assessment & Plan Note (Signed)
Obstructive sleep apnea on CPAP

## 2019-12-21 NOTE — Assessment & Plan Note (Signed)
History of essential hypertension with blood pressure measured today at 138/64.  She does check her blood pressure at home minutes in that range as well.  She is on losartan and Bystolic.

## 2019-12-21 NOTE — Assessment & Plan Note (Signed)
Schedule for coronary calcium score late next month

## 2019-12-21 NOTE — Progress Notes (Signed)
12/21/2019 [name]   01-20-1946  [id number]  Primary Physician Parke Poisson, MD Primary Cardiologist: Lorretta Harp MD Lupe Carney, [place]  HPI:  Wanda Hanson is a 74 y.o.   severely overweight married Caucasian female mother of 3 children who has worked as a Network engineer in the past.  She was referred by Rexene Edison , NP because of shortness of breath.  I last saw her in the office 11/22/2019.  She is accompanied by her husband Wanda Hanson today. She has never smoked.  There is no family history for heart disease.  She is never had a heart attack or stroke.  She does have hypertension and diabetes as well as obstructive sleep apnea on CPAP.  She is noticed increased shortness of breath over the last month or so as well as lower extremity edema which is dependent.  She did have a chest CTA [facility] 10/03/2019 that showed no evidence of pulmonary embolus with moderate coronary artery calcification.  2D echo performed 10/18/2019 revealed normal LV systolic function with grade 1 diastolic dysfunction.  She was placed on low-dose furosemide which is not improved her edema.  Since I saw her a month ago her blood pressure has markedly improved with medication titration.  She is lost 10 pounds probably related to diuresis.  Her peripheral edema has significantly improved as has her breathing.  She is scheduled for a coronary calcium score late next month.   Current Meds  Medication Sig  . albuterol (PROAIR HFA) 108 (90 Base) MCG/ACT inhaler Inhale 2 puffs into the lungs every 4 (four) hours as needed for wheezing or shortness of breath.  . allopurinol (ZYLOPRIM) 300 MG tablet Take 300 mg by mouth daily.  . budesonide-formoterol (SYMBICORT) 160-4.5 MCG/ACT inhaler Inhale 2 puffs into the lungs 2 (two) times daily.  . diclofenac sodium (VOLTAREN) 1 % GEL Apply 1 application topically daily as needed for pain. Right great toe  . diphenhydrAMINE (BENADRYL) 50  MG capsule Take 1 capsule (50 mg total) by mouth at bedtime as needed for sleep.  . DULoxetine (CYMBALTA) 60 MG capsule Take 60 mg by mouth daily.  . fluticasone (FLONASE) 50 MCG/ACT nasal spray Place 1 spray into both nostrils daily.  . furosemide (LASIX) 20 MG tablet Take 20 mg by mouth.  . gabapentin (NEURONTIN) 100 MG capsule Take 400 mg by mouth 2 (two) times daily.  Marland Kitchen glipiZIDE (GLUCOTROL) 5 MG tablet Take 5 mg by mouth daily before breakfast.  . guaiFENesin (MUCINEX) 600 MG 12 hr tablet Take 1 tablet (600 mg total) by mouth 2 (two) times daily as needed for cough or to loosen phlegm.  . iron polysaccharides (NIFEREX) 150 MG capsule Take 150 mg by mouth daily.  Marland Kitchen LORazepam (ATIVAN) 0.5 MG tablet Take 1 mg by mouth 3 times/day as needed-between meals & bedtime for sleep.   Marland Kitchen losartan (COZAAR) 100 MG tablet Take 1 tablet (100 mg total) by mouth daily.  . meclizine (ANTIVERT) 12.5 MG tablet Take 12.5 mg by mouth 3 (three) times daily as needed for dizziness.  . Multiple Vitamins-Minerals (MULTIVITAMIN WITH MINERALS) tablet Take 1 tablet by mouth daily.  . nebivolol (BYSTOLIC) 5 MG tablet Take 1 tablet (5 mg total) by mouth daily.  . nilotinib (TASIGNA) 200 MG capsule Take 200 mg by mouth every 12 (twelve) hours. Give on an empty stomach 1 hour before or 2 hours after meals.  Marland Kitchen omeprazole (PRILOSEC) 40 MG capsule Take 40  mg by mouth daily.  . potassium chloride (KLOR-CON) 20 MEQ packet Take by mouth 2 (two) times daily.  . pravastatin (PRAVACHOL) 10 MG tablet Take 10 mg by mouth daily.  Marland Kitchen rOPINIRole (REQUIP) 1 MG tablet Take 4 mg by mouth at bedtime.   Marland Kitchen tiZANidine (ZANAFLEX) 4 MG capsule Take 4 mg by mouth at bedtime.   . traMADol (ULTRAM) 50 MG tablet Take by mouth every 6 (six) hours as needed.  . [DISCONTINUED] predniSONE (DELTASONE) 10 MG tablet 2 tabs daily for 5 days     Allergies  Allergen Reactions  . Cortisone Swelling  . Pollen Extract Other (See Comments)    Typical "Hay  Fever" symptoms Stuffy nose  . Ace Inhibitors Swelling  . Asa [Aspirin]     Ulcer   . Latex   . Morphine And Related     SOB   . Neosporin [Bacitracin-Polymyxin B]     Swelling where applied   . Tylenol [Acetaminophen]     Ulcer     Social History   Socioeconomic History  . Marital status: Married    Spouse name: Not on file  . Number of children: Not on file  . Years of education: Not on file  . Highest education level: Not on file  Occupational History  . Not on file  Tobacco Use  . Smoking status: Never Smoker  . Smokeless tobacco: Never Used  Substance and Sexual Activity  . Alcohol use: No  . Drug use: No  . Sexual activity: Not on file  Other Topics Concern  . Not on file  Social History Narrative  . Not on file   Social Determinants of Health   Financial Resource Strain:   . Difficulty of Paying Living Expenses: Not on file  Food Insecurity:   . Worried About Charity fundraiser in the Last Year: Not on file  . Ran Out of Food in the Last Year: Not on file  Transportation Needs:   . Lack of Transportation (Medical): Not on file  . Lack of Transportation (Non-Medical): Not on file  Physical Activity:   . Days of Exercise per Week: Not on file  . Minutes of Exercise per Session: Not on file  Stress:   . Feeling of Stress : Not on file  Social Connections:   . Frequency of Communication with Friends and Family: Not on file  . Frequency of Social Gatherings with Friends and Family: Not on file  . Attends Religious Services: Not on file  . Active Member of Clubs or Organizations: Not on file  . Attends Archivist Meetings: Not on file  . Marital Status: Not on file  Intimate Partner Violence:   . Fear of Current or Ex-Partner: Not on file  . Emotionally Abused: Not on file  . Physically Abused: Not on file  . Sexually Abused: Not on file     Review of Systems: General: negative for chills, fever, night sweats or weight changes.    Cardiovascular: negative for chest pain, dyspnea on exertion, edema, orthopnea, palpitations, paroxysmal nocturnal dyspnea or shortness of breath Dermatological: negative for rash Respiratory: negative for cough or wheezing Urologic: negative for hematuria Abdominal: negative for nausea, vomiting, diarrhea, bright red blood per rectum, melena, or hematemesis Neurologic: negative for visual changes, syncope, or dizziness All other systems reviewed and are otherwise negative except as noted above.    Blood pressure 138/64, pulse 75, temperature (!) 97.3 F (36.3 C), height 5' (1.524 m),  weight 198 lb (89.8 kg).  General appearance: alert and no distress Neck: no adenopathy, no carotid bruit, no JVD, supple, symmetrical, trachea midline and thyroid not enlarged, symmetric, no tenderness/mass/nodules Lungs: clear to auscultation bilaterally Heart: regular rate and rhythm, S1, S2 normal, no murmur, click, rub or gallop Extremities: extremities normal, atraumatic, no cyanosis or edema Pulses: 2+ and symmetric Skin: Skin color, texture, turgor normal. No rashes or lesions Neurologic: Alert and oriented X 3, normal strength and tone. Normal symmetric reflexes. Normal coordination and gait  EKG not performed today  ASSESSMENT AND PLAN:   Essential hypertension History of essential hypertension with blood pressure measured today at 138/64.  She does check her blood pressure at home minutes in that range as well.  She is on losartan and Bystolic.  OSA on CPAP Obstructive sleep apnea on CPAP  Coronary artery calcification seen on CT scan Schedule for coronary calcium score late next month  Lower extremity edema History lower extremity edema and diastolic dysfunction on low-dose diuretic.  She is lost 10 pound since I saw her last.  Her edema is markedly improved.  She is aware of salt restriction.      Lorretta Harp MD FACP,FACC,FAHA, [facility] 12/21/2019 2:38 PM

## 2019-12-24 ENCOUNTER — Other Ambulatory Visit (HOSPITAL_COMMUNITY)
Admission: RE | Admit: 2019-12-24 | Discharge: 2019-12-24 | Disposition: A | Discharge status: Home / Self Care | Payer: Medicare Other | Source: Ambulatory Visit | Attending: Adult Health | Admitting: Adult Health

## 2019-12-24 DIAGNOSIS — Z20822 Contact with and (suspected) exposure to covid-19: Secondary | ICD-10-CM | POA: Diagnosis not present

## 2019-12-24 DIAGNOSIS — Z01812 Encounter for preprocedural laboratory examination: Secondary | ICD-10-CM | POA: Insufficient documentation

## 2019-12-24 LAB — SARS CORONAVIRUS 2 (TAT 6-24 HRS): SARS Coronavirus 2: NEGATIVE

## 2019-12-27 ENCOUNTER — Encounter: Payer: Self-pay | Admitting: Adult Health

## 2019-12-27 ENCOUNTER — Ambulatory Visit (INDEPENDENT_AMBULATORY_CARE_PROVIDER_SITE_OTHER): Payer: Medicare Other | Admitting: Pulmonary Disease

## 2019-12-27 ENCOUNTER — Ambulatory Visit (INDEPENDENT_AMBULATORY_CARE_PROVIDER_SITE_OTHER): Payer: Medicare Other | Admitting: Adult Health

## 2019-12-27 ENCOUNTER — Other Ambulatory Visit: Payer: Self-pay

## 2019-12-27 DIAGNOSIS — J209 Acute bronchitis, unspecified: Secondary | ICD-10-CM | POA: Diagnosis not present

## 2019-12-27 DIAGNOSIS — R0602 Shortness of breath: Secondary | ICD-10-CM | POA: Diagnosis not present

## 2019-12-27 DIAGNOSIS — R0789 Other chest pain: Secondary | ICD-10-CM

## 2019-12-27 DIAGNOSIS — G4733 Obstructive sleep apnea (adult) (pediatric): Secondary | ICD-10-CM

## 2019-12-27 DIAGNOSIS — R509 Fever, unspecified: Secondary | ICD-10-CM | POA: Diagnosis not present

## 2019-12-27 DIAGNOSIS — Z9989 Dependence on other enabling machines and devices: Secondary | ICD-10-CM

## 2019-12-27 LAB — PULMONARY FUNCTION TEST
DL/VA % pred: 141 %
DL/VA: 6.02 ml/min/mmHg/L
DLCO unc % pred: 110 %
DLCO unc: 18.62 ml/min/mmHg
FEF 25-75 Post: 2.6 L/sec
FEF 25-75 Pre: 1.65 L/sec
FEF2575-%Change-Post: 57 %
FEF2575-%Pred-Post: 159 %
FEF2575-%Pred-Pre: 101 %
FEV1-%Change-Post: 6 %
FEV1-%Pred-Post: 85 %
FEV1-%Pred-Pre: 80 %
FEV1-Post: 1.6 L
FEV1-Pre: 1.51 L
FEV1FVC-%Change-Post: 6 %
FEV1FVC-%Pred-Pre: 108 %
FEV6-%Change-Post: -2 %
FEV6-%Pred-Post: 75 %
FEV6-%Pred-Pre: 77 %
FEV6-Post: 1.78 L
FEV6-Pre: 1.83 L
FEV6FVC-%Pred-Post: 105 %
FEV6FVC-%Pred-Pre: 105 %
FVC-%Change-Post: 0 %
FVC-%Pred-Post: 73 %
FVC-%Pred-Pre: 73 %
FVC-Post: 1.82 L
FVC-Pre: 1.83 L
Post FEV1/FVC ratio: 88 %
Post FEV6/FVC ratio: 100 %
Pre FEV1/FVC ratio: 82 %
Pre FEV6/FVC Ratio: 100 %
RV % pred: 105 %
RV: 2.11 L
TLC % pred: 99 %
TLC: 4.43 L

## 2019-12-27 NOTE — Progress Notes (Signed)
@Patient  ID: Wanda Hanson, female    DOB: 02-17-1946, 74 y.o.   MRN: [id number]  Chief Complaint  Patient presents with  . Follow-up    Dyspnea     Referring provider: Parke Poisson, MD  HPI: 74 year old female never smoker followed for obstructive sleep apnea on nocturnal CPAP Medical history significant for bilateral pneumonia in 2018, CML diabetes and hypertension  TEST/EVENTS :  Auto CPAP 10/10/17 to 01/07/18 >>used on 78 of 90 nights with average 7 hrs 5 min. Average AHI 0.5 with median CPAP 9 and 95 th percentile CPAP 11 cm H2O. HST 01/31/18 >> AHI 7.4, SaO2 low 80%. AutoCPAP 09/27/19 to 10/26/19 >>used on 30 of 30 nights with average 7 hrs 58 min. Average AHI 0.8 with median CPAP 10 and 95 th percentile CPAP 13 cm H2O.  10/02/2019 CTA Chest No CT imaging evidence of acute pulmonary thromboembolic disease to the level of the segmental pulmonary arteries.  Pulmonary mosaicism, partially accentuated by incidental expiratory phase images with diffuse airway wall thickening, may favor small airway disease. No definite pneumonia. Similar cirrhotic liver morphology with trace perihepatic ascites  12/27/2019 Follow up : Asthma and dyspnea  Patient returns for a 1 month follow-up.  Patient was seen last visit for a slow to resolve asthmatic bronchitis.  She was initially treated at the beginning of November.  CT chest was negative for PE.  It did show a mosaic attenuation.  COVID-19 testing and influenza were negative.  Lab work was unrevealing.  Patient was treated with a Z-Pak and prednisone.  2D echo showed grade 1 diastolic dysfunction mild pulmonary hypertension with an RVSP at 32 mmHg.  Patient was seen by cardiology and Lasix was increased and amlodipine was discontinued. Patient was started on Symbicort.  Patient had slow improvement in symptoms.  Last visit Symbicort was decreased down to 1 puff twice daily.. Patient says she is feeling much better.  Cough has resolved.   Her breathing is back to baseline. Pulmonary function testing today showed mild restriction with an FEV1 at 85%, ratio 88, FVC 73%, no significant bronchodilator response, DLCO 110%.   Allergies  Allergen Reactions  . Cortisone Swelling  . Pollen Extract Other (See Comments)    Typical "Hay Fever" symptoms Stuffy nose  . Ace Inhibitors Swelling  . Asa [Aspirin]     Ulcer   . Latex   . Morphine And Related     SOB   . Neosporin [Bacitracin-Polymyxin B]     Swelling where applied   . Tylenol [Acetaminophen]     Ulcer     Immunization History  Administered Date(s) Administered  . Fluad Quad(high Dose 65+) 10/28/2019  . Influenza Split 08/08/2017  . Influenza, High Dose Seasonal PF 09/18/2014, 07/22/2017, 08/24/2018  . Pneumococcal Conjugate-13 08/08/2017    Past Medical History:  Diagnosis Date  . Chronic back pain   . 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 Depression   . Diabetes mellitus ([place])   . Eczema   . Fatty liver   . GERD (gastroesophageal reflux disease)   . Gout   . Hyperlipidemia   . Hypertension   . Migraine   . OSA (obstructive sleep apnea)   . Peripheral neuropathy   . Restless leg syndrome   . Rosacea   . Seborrheic dermatitis     Tobacco History: Social History   Tobacco Use  Smoking Status Never Smoker  Smokeless Tobacco Never Used   Counseling given: Not Answered  Outpatient Medications Prior to Visit  Medication Sig Dispense Refill  . albuterol (PROAIR HFA) 108 (90 Base) MCG/ACT inhaler Inhale 2 puffs into the lungs every 4 (four) hours as needed for wheezing or shortness of breath. 8 g 5  . allopurinol (ZYLOPRIM) 300 MG tablet Take 300 mg by mouth daily.    . budesonide-formoterol (SYMBICORT) 160-4.5 MCG/ACT inhaler Inhale 2 puffs into the lungs 2 (two) times daily. 1 Inhaler 5  . diclofenac sodium (VOLTAREN) 1 % GEL Apply 1 application topically daily as needed for pain. Right great toe    . diphenhydrAMINE (BENADRYL)  50 MG capsule Take 1 capsule (50 mg total) by mouth at bedtime as needed for sleep. 30 capsule 0  . DULoxetine (CYMBALTA) 60 MG capsule Take 60 mg by mouth daily.    . fluticasone (FLONASE) 50 MCG/ACT nasal spray Place 1 spray into both nostrils daily.    . furosemide (LASIX) 20 MG tablet Take 20 mg by mouth.    . gabapentin (NEURONTIN) 100 MG capsule Take 400 mg by mouth 2 (two) times daily.    Marland Kitchen glipiZIDE (GLUCOTROL) 5 MG tablet Take 5 mg by mouth daily before breakfast.    . guaiFENesin (MUCINEX) 600 MG 12 hr tablet Take 1 tablet (600 mg total) by mouth 2 (two) times daily as needed for cough or to loosen phlegm.    . iron polysaccharides (NIFEREX) 150 MG capsule Take 150 mg by mouth daily.    Marland Kitchen LORazepam (ATIVAN) 0.5 MG tablet Take 1 mg by mouth 3 times/day as needed-between meals & bedtime for sleep.     Marland Kitchen losartan (COZAAR) 100 MG tablet Take 1 tablet (100 mg total) by mouth daily. 90 tablet 3  . meclizine (ANTIVERT) 12.5 MG tablet Take 12.5 mg by mouth 3 (three) times daily as needed for dizziness.    . Multiple Vitamins-Minerals (MULTIVITAMIN WITH MINERALS) tablet Take 1 tablet by mouth daily.    . nebivolol (BYSTOLIC) 5 MG tablet Take 1 tablet (5 mg total) by mouth daily. 90 tablet 3  . nilotinib (TASIGNA) 200 MG capsule Take 200 mg by mouth every 12 (twelve) hours. Give on an empty stomach 1 hour before or 2 hours after meals.    Marland Kitchen omeprazole (PRILOSEC) 40 MG capsule Take 40 mg by mouth daily.    . potassium chloride (KLOR-CON) 20 MEQ packet Take by mouth 2 (two) times daily.    . pravastatin (PRAVACHOL) 10 MG tablet Take 10 mg by mouth daily.    Marland Kitchen rOPINIRole (REQUIP) 1 MG tablet Take 4 mg by mouth at bedtime.     Marland Kitchen tiZANidine (ZANAFLEX) 4 MG capsule Take 4 mg by mouth at bedtime.     . traMADol (ULTRAM) 50 MG tablet Take by mouth every 6 (six) hours as needed.     No facility-administered medications prior to visit.     Review of Systems:   Constitutional:   No  weight loss,  night sweats,  Fevers, chills,  +fatigue, or  lassitude.  HEENT:   No headaches,  Difficulty swallowing,  Tooth/dental problems, or  Sore throat,                No sneezing, itching, ear ache, nasal congestion, post nasal drip,   CV:  No chest pain,  Orthopnea, PND, swelling in lower extremities, anasarca, dizziness, palpitations, syncope.   GI  No heartburn, indigestion, abdominal pain, nausea, vomiting, diarrhea, change in bowel habits, loss of appetite, bloody stools.   Resp: no  chest wall deformity  Skin: no rash or lesions.  GU: no dysuria, change in color of urine, no urgency or frequency.  No flank pain, no hematuria   MS:  No joint pain or swelling.  No decreased range of motion.  No back pain.    Physical Exam  BP 140/68 (BP Location: Left Arm, Cuff Size: Normal)   Pulse 77   Temp 97.6 F (36.4 C) (Temporal)   Ht 5' (1.524 m)   Wt 194 lb (88 kg)   SpO2 96% Comment: RA  BMI 37.89 kg/m   GEN: A/Ox3; pleasant , NAD, BMI 37   HEENT:  [place]/AT,  nOSE-clear, THROAT-clear, no lesions, no postnasal drip or exudate noted.   NECK:  Supple w/ fair ROM; no JVD; normal carotid impulses w/o bruits; no thyromegaly or nodules palpated; no lymphadenopathy.    RESP  Clear  P & A; w/o, wheezes/ rales/ or rhonchi. no accessory muscle use, no dullness to percussion  CARD:  RRR, no m/r/g, no peripheral edema, pulses intact, no cyanosis or clubbing.  GI:   Soft & nt; nml bowel sounds; no organomegaly or masses detected.   Musco: Warm bil, no deformities or joint swelling noted.   Neuro: alert, no focal deficits noted.    Skin: Warm, no lesions or rashes    Lab Results:  CBC   BNP No results found for: BNP  ProBNP No results found for: PROBNP  Imaging: No results found.    PFT Results Latest Ref Rng & Units 12/27/2019  FVC-Pre L 1.83  FVC-Predicted Pre % 73  FVC-Post L 1.82  FVC-Predicted Post % 73  Pre FEV1/FVC % % 82  Post FEV1/FCV % % 88  FEV1-Pre L 1.51    FEV1-Predicted Pre % 80  FEV1-Post L 1.60  DLCO UNC% % 110  DLCO COR %Predicted % 141  TLC L 4.43  TLC % Predicted % 99  RV % Predicted % 105    No results found for: NITRICOXIDE      Assessment & Plan:   Acute bronchitis Recent slow to resolve asthmatic bronchitis now resolved.  Pulmonary function testing today shows no airflow obstruction. Suspect she has some active reactive airways when she gets an upper respiratory infection.  Can use ProAir as needed.  May discontinue Symbicort  Plan  Patient Instructions  May stop Symibcort for now.  Continue on Lasix 20mg  daily .  Low salt diet  Keep legs elevated.   Continue on CPAP At bedtime   Saline nasal rinses As needed   Saline nasal gel At bedtime   Advance activity as tolerated.  ProAir Inhaler 1-2 puffs every 4hrs as needed As needed   Follow up with Dr. Halford Chessman in 3-4  months and As needed   Please contact office for sooner follow up if symptoms do not improve or worsen or seek emergency care       OSA on CPAP Well-controlled on CPAP no changes  Shortness of breath Recent dyspnea suspect is multifactorial with recent asthmatic bronchitis, deconditioning and diastolic dysfunction.  Patient is continue with follow-up with cardiology as planned.  Continue on her current regimen.  Seems to have been improving.     Rexene Edison, NP 12/27/2019

## 2019-12-27 NOTE — Assessment & Plan Note (Signed)
Well-controlled on CPAP no changes

## 2019-12-27 NOTE — Progress Notes (Signed)
Reviewed and agree with assessment/plan.   Chabeli Barsamian, MD [place] 11/19/2016, 12:24 PM Pager:  [phone]  

## 2019-12-27 NOTE — Assessment & Plan Note (Signed)
Recent dyspnea suspect is multifactorial with recent asthmatic bronchitis, deconditioning and diastolic dysfunction.  Patient is continue with follow-up with cardiology as planned.  Continue on her current regimen.  Seems to have been improving.

## 2019-12-27 NOTE — Patient Instructions (Addendum)
May stop Symibcort for now.  Continue on Lasix 20mg  daily .  Low salt diet  Keep legs elevated.   Continue on CPAP At bedtime   Saline nasal rinses As needed   Saline nasal gel At bedtime   Advance activity as tolerated.  ProAir Inhaler 1-2 puffs every 4hrs as needed As needed   Follow up with Dr. Halford Chessman in 3-4  months and As needed   Please contact office for sooner follow up if symptoms do not improve or worsen or seek emergency care

## 2019-12-27 NOTE — Assessment & Plan Note (Signed)
Recent slow to resolve asthmatic bronchitis now resolved.  Pulmonary function testing today shows no airflow obstruction. Suspect she has some active reactive airways when she gets an upper respiratory infection.  Can use ProAir as needed.  May discontinue Symbicort  Plan  Patient Instructions  May stop Symibcort for now.  Continue on Lasix 20mg  daily .  Low salt diet  Keep legs elevated.   Continue on CPAP At bedtime   Saline nasal rinses As needed   Saline nasal gel At bedtime   Advance activity as tolerated.  ProAir Inhaler 1-2 puffs every 4hrs as needed As needed   Follow up with Dr. Halford Chessman in 3-4  months and As needed   Please contact office for sooner follow up if symptoms do not improve or worsen or seek emergency care

## 2019-12-27 NOTE — Progress Notes (Signed)
Full PFT performed today. °

## 2019-12-28 NOTE — Addendum Note (Signed)
Addended by: Suzzanne Cloud E on: 12/28/2019 09:06 AM   Modules accepted: Orders

## 2020-01-10 ENCOUNTER — Ambulatory Visit: Payer: Medicare Other

## 2020-01-18 ENCOUNTER — Inpatient Hospital Stay: Admission: RE | Admit: 2020-01-18 | Payer: Medicare Other | Source: Ambulatory Visit

## 2020-01-23 NOTE — Telephone Encounter (Signed)
VS/TP forwarding pt's email as [place].  Awaiting to hear what questions pt has, or if she would like an office visit to discuss this.  It looks like pt is being managed by rheumatology and these notes are in [place].   Hello, Dr. Royal Piedra, [facility] all is well with you. I am not been doing so hot over the past several weeks now. 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 Please share my information with Dr. Halford Chessman also. All of my information has been or should have been shared everywhere with all of my doctors. There are also pictures of this. If and when this all goes away, I hope I never have to go through anything like this again, but they say it can. I just wanted to give you a heads up and if you would like to talk with me, I would like that and I may have some questions also. Take care and Thank you, Wanda Hanson

## 2020-02-02 ENCOUNTER — Other Ambulatory Visit: Payer: Self-pay

## 2020-02-02 ENCOUNTER — Ambulatory Visit: Admission: RE | Admit: 2020-02-02 | Payer: Self-pay | Source: Ambulatory Visit

## 2020-02-10 ENCOUNTER — Other Ambulatory Visit: Payer: Self-pay

## 2020-02-10 ENCOUNTER — Ambulatory Visit: Payer: Medicare Other | Admitting: Cardiovascular Disease

## 2020-02-10 ENCOUNTER — Encounter: Payer: Self-pay | Admitting: Cardiovascular Disease

## 2020-02-10 DIAGNOSIS — I10 Essential (primary) hypertension: Secondary | ICD-10-CM

## 2020-02-10 DIAGNOSIS — G4733 Obstructive sleep apnea (adult) (pediatric): Secondary | ICD-10-CM | POA: Diagnosis not present

## 2020-02-10 DIAGNOSIS — R600 Localized edema: Secondary | ICD-10-CM

## 2020-02-10 DIAGNOSIS — Z9989 Dependence on other enabling machines and devices: Secondary | ICD-10-CM

## 2020-02-10 DIAGNOSIS — R0602 Shortness of breath: Secondary | ICD-10-CM

## 2020-02-10 DIAGNOSIS — I2510 Atherosclerotic heart disease of native coronary artery without angina pectoris: Secondary | ICD-10-CM | POA: Diagnosis not present

## 2020-02-10 MED ORDER — AMLODIPINE BESYLATE 2.5 MG PO TABS: 2.5000 mg | ORAL_TABLET | Freq: Two times a day (BID) | ORAL | 3 refills | Status: DC

## 2020-02-10 MED ORDER — LOSARTAN POTASSIUM 50 MG PO TABS: 50.0000 mg | ORAL_TABLET | Freq: Every day | ORAL | 3 refills | Status: AC

## 2020-02-10 NOTE — Patient Instructions (Addendum)
Medication Instructions:  DECREASE AMLODIPINE TO 2.5 MG ONCE DAILY= 1/2 OF THE 5 MG TABLET ONCE DAILY  DECREASE LOSARTAN TO 50 MG ONCE DAILY= 1/2 OF THE 100 MG TABLET ONCE DAILY  *If you need a refill on your cardiac medications before your next appointment, please call your pharmacy*   Lab Work: If you have labs (blood work) drawn today and your tests are completely normal, you will receive your results only by: Marland Kitchen MyChart Message (if you have MyChart) OR . A paper copy in the mail If you have any lab test that is abnormal or we need to change your treatment, we will call you to review the results.   Follow-Up: At [facility], you and your health needs are our priority.  As part of our continuing mission to provide you with exceptional heart care, we have created designated Provider Care Teams.  These Care Teams include your primary Cardiologist (physician) and Advanced Practice Providers (APPs -  Physician Assistants and Nurse Practitioners) who all work together to provide you with the care you need, when you need it.  We recommend signing up for the patient portal called "MyChart".  Sign up information is provided on this After Visit Summary.  MyChart is used to connect with patients for Virtual Visits (Telemedicine).  Patients are able to view lab/test results, encounter notes, upcoming appointments, etc.  Non-urgent messages can be sent to your provider as well.   To learn more about what you can do with MyChart, go to [email or web address].    Your next appointment:   Your physician wants you to follow-up in: [place] will receive a reminder letter in the mail two months in advance. If you don't receive a letter, please call our office to schedule the follow-up appointment.   Your physician wants you to follow-up in: [place] will receive a reminder letter in the mail two months in advance. If you don't receive a letter, please call our office to  schedule the follow-up appointment.

## 2020-02-10 NOTE — Assessment & Plan Note (Signed)
History of shortness of breath probably multifactorial from obesity, deconditioning and diastolic heart failure improved with oral diuretics.

## 2020-02-10 NOTE — Progress Notes (Signed)
02/10/2020 [name]   Oct 27, 1946  [id number]  Primary Physician Parke Poisson, MD Primary Cardiologist: Lorretta Harp MD Lupe Carney, [place]  HPI:  Wanda Hanson is a 74 y.o.  severely overweight married Caucasian female mother of 3 children who has worked as a Network engineer in the past. She was referred by Rexene Edison, NP because of shortness of breath.  I last saw her in the office 12/21/2019.  She is accompanied by her husband Dominica Severin today.She has never smoked. There is no family history for heart disease. She is never had a heart attack or stroke. She does have hypertension and diabetes as well as obstructive sleep apnea on CPAP. She is noticed increased shortness of breath over the last month or so as well as lower extremity edema which is dependent. She did have a chest [facility]A [facility] 10/03/2019 that showed no evidence of pulmonary embolus with moderate coronary artery calcification. 2D echo performed 10/18/2019 revealed normal LV systolic function with grade 1 diastolic dysfunction. She was placed on low-dose furosemide which is not improved her edema.  Since I saw her 3 months ago her blood pressure has markedly improved with medication titration.  She is lost 5 pounds probably related to diuresis.  Her peripheral edema has significantly improved as has her breathing.  She is scheduled for a coronary calcium score late next month.  She is watching her salt intake as well.  She apparently has been diagnosed with a vasculitis recently.   Current Meds  Medication Sig  . albuterol (PROAIR HFA) 108 (90 Base) MCG/ACT inhaler Inhale 2 puffs into the lungs every 4 (four) hours as needed for wheezing or shortness of breath.  . allopurinol (ZYLOPRIM) 300 MG tablet Take 300 mg by mouth daily.  Marland Kitchen amLODipine (NORVASC) 5 MG tablet Take 5 mg by mouth 2 (two) times daily.  Marland Kitchen atorvastatin (LIPITOR) 40 MG tablet Take 40 mg by mouth daily.  .  budesonide-formoterol (SYMBICORT) 160-4.5 MCG/ACT inhaler Inhale 2 puffs into the lungs 2 (two) times daily.  . diclofenac sodium (VOLTAREN) 1 % GEL Apply 1 application topically daily as needed for pain. Right great toe  . DULoxetine (CYMBALTA) 60 MG capsule Take 60 mg by mouth daily.  . fluorouracil (EFUDEX) 5 % cream   . fluticasone (FLONASE) 50 MCG/ACT nasal spray Place 1 spray into both nostrils daily.  . furosemide (LASIX) 20 MG tablet Take 20 mg by mouth.  . gabapentin (NEURONTIN) 100 MG capsule Take 400 mg by mouth 2 (two) times daily.  Marland Kitchen glipiZIDE (GLUCOTROL) 5 MG tablet Take 5 mg by mouth daily before breakfast.  . guaiFENesin (MUCINEX) 600 MG 12 hr tablet Take 1 tablet (600 mg total) by mouth 2 (two) times daily as needed for cough or to loosen phlegm.  . iron polysaccharides (NIFEREX) 150 MG capsule Take 150 mg by mouth daily.  Marland Kitchen LORazepam (ATIVAN) 0.5 MG tablet Take 1 mg by mouth 3 times/day as needed-between meals & bedtime for sleep.   Marland Kitchen losartan (COZAAR) 100 MG tablet Take 1 tablet (100 mg total) by mouth daily.  . meclizine (ANTIVERT) 12.5 MG tablet Take 12.5 mg by mouth 3 (three) times daily as needed for dizziness.  . Multiple Vitamins-Minerals (MULTIVITAMIN WITH MINERALS) tablet Take 1 tablet by mouth daily.  . nebivolol (BYSTOLIC) 5 MG tablet Take 1 tablet (5 mg total) by mouth daily.  . nilotinib (TASIGNA) 200 MG capsule Take 200 mg by mouth every 12 (  twelve) hours. Give on an empty stomach 1 hour before or 2 hours after meals.  Marland Kitchen omeprazole (PRILOSEC) 40 MG capsule Take 40 mg by mouth daily.  . potassium chloride (KLOR-CON) 20 MEQ packet Take by mouth 2 (two) times daily.  . pravastatin (PRAVACHOL) 10 MG tablet Take 10 mg by mouth daily.  Marland Kitchen rOPINIRole (REQUIP) 1 MG tablet Take 4 mg by mouth at bedtime.   Marland Kitchen tiZANidine (ZANAFLEX) 4 MG capsule Take 4 mg by mouth at bedtime.   . traMADol (ULTRAM) 50 MG tablet Take by mouth every 6 (six) hours as needed.  . triamcinolone  ointment (KENALOG) 0.1 % Use on the affected areas twice daily. Not to face/groin/armpits  . TRULICITY [id number] SOPN      Allergies  Allergen Reactions  . Cortisone Swelling  . Pollen Extract Other (See Comments)    Typical "Hay Fever" symptoms Stuffy nose  . Ace Inhibitors Swelling  . Asa [Aspirin]     Ulcer   . Latex   . Morphine And Related     SOB   . Neosporin [Bacitracin-Polymyxin B]     Swelling where applied   . Tylenol [Acetaminophen]     Ulcer     Social History   Socioeconomic History  . Marital status: Married    Spouse name: Not on file  . Number of children: Not on file  . Years of education: Not on file  . Highest education level: Not on file  Occupational History  . Not on file  Tobacco Use  . Smoking status: Never Smoker  . Smokeless tobacco: Never Used  Substance and Sexual Activity  . Alcohol use: No  . Drug use: No  . Sexual activity: Not on file  Other Topics Concern  . Not on file  Social History Narrative  . Not on file   Social Determinants of Health   Financial Resource Strain:   . Difficulty of Paying Living Expenses:   Food Insecurity:   . Worried About Charity fundraiser in the Last Year:   . Arboriculturist in the Last Year:   Transportation Needs:   . Film/video editor (Medical):   Marland Kitchen Lack of Transportation (Non-Medical):   Physical Activity:   . Days of Exercise per Week:   . Minutes of Exercise per Session:   Stress:   . Feeling of Stress :   Social Connections:   . Frequency of Communication with Friends and Family:   . Frequency of Social Gatherings with Friends and Family:   . Attends Religious Services:   . Active Member of Clubs or Organizations:   . Attends Archivist Meetings:   Marland Kitchen Marital Status:   Intimate Partner Violence:   . Fear of Current or Ex-Partner:   . Emotionally Abused:   Marland Kitchen Physically Abused:   . Sexually Abused:      Review of Systems: General: negative for chills,  fever, night sweats or weight changes.  Cardiovascular: negative for chest pain, dyspnea on exertion, edema, orthopnea, palpitations, paroxysmal nocturnal dyspnea or shortness of breath Dermatological: negative for rash Respiratory: negative for cough or wheezing Urologic: negative for hematuria Abdominal: negative for nausea, vomiting, diarrhea, bright red blood per rectum, melena, or hematemesis Neurologic: negative for visual changes, syncope, or dizziness All other systems reviewed and are otherwise negative except as noted above.    Blood pressure (!) 96/56, pulse 78, height 5' (1.524 m), weight 193 lb 3.2 oz (87.6 kg), SpO2  99 %.  General appearance: alert and no distress Neck: no adenopathy, no carotid bruit, no JVD, supple, symmetrical, trachea midline and thyroid not enlarged, symmetric, no tenderness/mass/nodules Lungs: clear to auscultation bilaterally Heart: regular rate and rhythm, S1, S2 normal, no murmur, click, rub or gallop Extremities: extremities normal, atraumatic, no cyanosis or edema Pulses: 2+ and symmetric Skin: Skin color, texture, turgor normal. No rashes or lesions Neurologic: Alert and oriented X 3, normal strength and tone. Normal symmetric reflexes. Normal coordination and gait  EKG Not performed today   ASSESSMENT AND PLAN:   Essential hypertension History of essential hypertension with blood pressure measured today 96/56.  She is on Bystolic, losartan and amlodipine.  I am going to cut her losartan from 100 to 50 mg a day and her amlodipine from 5 to 2.5 mg a day  OSA on CPAP History of obstructive sleep apnea on CPAP  Coronary artery calcification seen on CT scan History of coronary calcification seen on chest CT scheduled for coronary calcium scoring in the near future  Lower extremity edema Lower extremity edema improved with oral diuretics  Shortness of breath History of shortness of breath probably multifactorial from obesity, deconditioning  and diastolic heart failure improved with oral diuretics.      Lorretta Harp MD FACP,FACC,FAHA, [facility] 02/10/2020 3:16 PM

## 2020-02-10 NOTE — Assessment & Plan Note (Signed)
History of essential hypertension with blood pressure measured today 96/56.  She is on Bystolic, losartan and amlodipine.  I am going to cut her losartan from 100 to 50 mg a day and her amlodipine from 5 to 2.5 mg a day

## 2020-02-10 NOTE — Assessment & Plan Note (Signed)
History of obstructive sleep apnea on CPAP. 

## 2020-02-10 NOTE — Assessment & Plan Note (Signed)
Lower extremity edema improved with oral diuretics

## 2020-02-10 NOTE — Assessment & Plan Note (Signed)
History of coronary calcification seen on chest CT scheduled for coronary calcium scoring in the near future

## 2020-03-21 ENCOUNTER — Ambulatory Visit: Payer: Medicare Other | Admitting: Adult Health

## 2020-04-17 ENCOUNTER — Other Ambulatory Visit: Payer: Self-pay

## 2020-04-25 ENCOUNTER — Telehealth: Payer: Self-pay | Admitting: Cardiovascular Disease

## 2020-04-25 NOTE — Telephone Encounter (Signed)
I attempted to contact patient 04/25/20 to schedule follow up visit from [facility] recall list. Spoke with patients husband and he stated there are too many good  heart doctors out here to put up with being rushed after having to wait over an hour to be seen. Patient's husband also stated that Dr. Gwenlyn Found needs to be aware of his comments I replied ok to statement and told patient to have good day and then call ended.

## 2020-05-04 ENCOUNTER — Other Ambulatory Visit: Payer: Self-pay | Admitting: Adult Health

## 2020-06-05 ENCOUNTER — Ambulatory Visit: Payer: Medicare Other | Admitting: Pulmonary Disease

## 2020-06-23 ENCOUNTER — Emergency Department (HOSPITAL_BASED_OUTPATIENT_CLINIC_OR_DEPARTMENT_OTHER): Payer: Medicare Other

## 2020-06-23 ENCOUNTER — Emergency Department (HOSPITAL_BASED_OUTPATIENT_CLINIC_OR_DEPARTMENT_OTHER)
Admission: EM | Admit: 2020-06-23 | Discharge: 2020-06-24 | Disposition: A | Discharge status: Home / Self Care | Payer: Medicare Other | Attending: Emergency Medicine | Admitting: Emergency Medicine

## 2020-06-23 ENCOUNTER — Other Ambulatory Visit: Payer: Self-pay

## 2020-06-23 ENCOUNTER — Encounter (HOSPITAL_BASED_OUTPATIENT_CLINIC_OR_DEPARTMENT_OTHER): Payer: Self-pay

## 2020-06-23 DIAGNOSIS — Z79899 Other long term (current) drug therapy: Secondary | ICD-10-CM | POA: Insufficient documentation

## 2020-06-23 DIAGNOSIS — E119 Type 2 diabetes mellitus without complications: Secondary | ICD-10-CM | POA: Insufficient documentation

## 2020-06-23 DIAGNOSIS — Z20822 Contact with and (suspected) exposure to covid-19: Secondary | ICD-10-CM | POA: Diagnosis not present

## 2020-06-23 DIAGNOSIS — I10 Essential (primary) hypertension: Secondary | ICD-10-CM | POA: Diagnosis not present

## 2020-06-23 DIAGNOSIS — M7989 Other specified soft tissue disorders: Secondary | ICD-10-CM

## 2020-06-23 DIAGNOSIS — R0602 Shortness of breath: Secondary | ICD-10-CM | POA: Diagnosis not present

## 2020-06-23 LAB — CBC WITH DIFFERENTIAL/PLATELET
Abs Immature Granulocytes: 0.03 10*3/uL (ref 0.00–0.07)
Basophils Absolute: 0.2 10*3/uL — ABNORMAL HIGH (ref 0.0–0.1)
Basophils Relative: 2 %
Eosinophils Absolute: 0.3 10*3/uL (ref 0.0–0.5)
Eosinophils Relative: 3 %
HCT: 39.1 % (ref 36.0–46.0)
Hemoglobin: 12.4 g/dL (ref 12.0–15.0)
Immature Granulocytes: 0 %
Lymphocytes Relative: 24 %
Lymphs Abs: 2.5 10*3/uL (ref 0.7–4.0)
MCH: 27 pg (ref 26.0–34.0)
MCHC: 31.7 g/dL (ref 30.0–36.0)
MCV: 85 fL (ref 80.0–100.0)
Monocytes Absolute: 0.9 10*3/uL (ref 0.1–1.0)
Monocytes Relative: 9 %
Neutro Abs: 6.3 10*3/uL (ref 1.7–7.7)
Neutrophils Relative %: 62 %
Platelets: 283 10*3/uL (ref 150–400)
RBC: 4.6 MIL/uL (ref 3.87–5.11)
RDW: 14.3 % (ref 11.5–15.5)
WBC: 10.3 10*3/uL (ref 4.0–10.5)
nRBC: 0 % (ref 0.0–0.2)

## 2020-06-23 LAB — URINALYSIS, ROUTINE W REFLEX MICROSCOPIC
Bilirubin Urine: NEGATIVE
Glucose, UA: NEGATIVE mg/dL
Hgb urine dipstick: NEGATIVE
Ketones, ur: NEGATIVE mg/dL
Leukocytes,Ua: NEGATIVE
Nitrite: NEGATIVE
Protein, ur: NEGATIVE mg/dL
Specific Gravity, Urine: 1.02 (ref 1.005–1.030)
pH: 5.5 (ref 5.0–8.0)

## 2020-06-23 LAB — COMPREHENSIVE METABOLIC PANEL
ALT: 30 U/L (ref 0–44)
AST: 47 U/L — ABNORMAL HIGH (ref 15–41)
Albumin: 3.9 g/dL (ref 3.5–5.0)
Alkaline Phosphatase: 135 U/L — ABNORMAL HIGH (ref 38–126)
Anion gap: 12 (ref 5–15)
BUN: 13 mg/dL (ref 8–23)
CO2: 24 mmol/L (ref 22–32)
Calcium: 8.8 mg/dL — ABNORMAL LOW (ref 8.9–10.3)
Chloride: 101 mmol/L (ref 98–111)
Creatinine, Ser: 0.8 mg/dL (ref 0.44–1.00)
GFR calc Af Amer: >60 mL/min (ref >60)
GFR calc non Af Amer: >60 mL/min (ref >60)
Glucose, Bld: 203 mg/dL — ABNORMAL HIGH (ref 70–99)
Potassium: 4.2 mmol/L (ref 3.5–5.1)
Sodium: 137 mmol/L (ref 135–145)
Total Bilirubin: 1.2 mg/dL (ref 0.3–1.2)
Total Protein: 8 g/dL (ref 6.5–8.1)

## 2020-06-23 LAB — BRAIN NATRIURETIC PEPTIDE: B Natriuretic Peptide: 182 pg/mL — ABNORMAL HIGH (ref 0.0–100.0)

## 2020-06-23 LAB — SARS CORONAVIRUS 2 BY RT PCR (HOSPITAL ORDER, PERFORMED IN ~~LOC~~ HOSPITAL LAB): SARS Coronavirus 2: NEGATIVE

## 2020-06-23 MED ORDER — IOHEXOL 350 MG/ML SOLN
80.0000 mL | Freq: Once | INTRAVENOUS | Status: AC | PRN
  Administered 2020-06-23: 80 mL via INTRAVENOUS

## 2020-06-23 NOTE — ED Notes (Signed)
ED Provider at bedside. 

## 2020-06-23 NOTE — ED Provider Notes (Signed)
[place] EMERGENCY DEPARTMENT Provider Note   CSN: [id number] Arrival date & time: 06/23/20  1311     History Chief Complaint  Patient presents with  . Shortness of Breath    Wanda Hanson is a 74 y.o. female with a past medical history of CML, diabetes, GERD, hypertension, hyperlipidemia, presents today for evaluation of multiple complaints.  She reports that over the past 1-1/2 weeks she has had shortness of breath, cough, or throat, myalgias/arthralgias, altered taste, subjective fevers and decreased energy with occasional headache.  Does report swelling in her bilateral legs, right worse than left.  She does not have a history of DVT.  She reports she is taking all of her medications as directed.  She is not vaccinated against Covid, denies any known Covid contacts.   Shortness of breath is made slightly better with using her Symbicort inhalers.  She has not seen her pulmonologist or primary care doctor since this started. HPI     Past Medical History:  Diagnosis Date  . Chronic back pain   . 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 Depression   . Diabetes mellitus ([place])   . Eczema   . Fatty liver   . GERD (gastroesophageal reflux disease)   . Gout   . Hyperlipidemia   . Hypertension   . Migraine   . OSA (obstructive sleep apnea)   . Peripheral neuropathy   . Restless leg syndrome   . Rosacea   . Seborrheic dermatitis     Patient Active Problem List   Diagnosis Date Noted  . Coronary artery calcification seen on CT scan 11/22/2019  . Lower extremity edema 11/22/2019  . Shortness of breath 11/22/2019  . Acute bronchitis 10/28/2019  . Racing heart beat 10/28/2019  . Moderate obesity 02/26/2018  . OSA on CPAP 12/30/2017  . Hypoxia   . Hypokalemia   . Essential hypertension   . Sepsis due to pneumonia ([place]) 11/15/2017    Past Surgical History:  Procedure Laterality Date  . ABDOMINAL HYSTERECTOMY    . BACK SURGERY    . JOINT  REPLACEMENT       OB History   No obstetric history on file.     Family History  Problem Relation Age of Onset  . Lung cancer Father     Social History   Tobacco Use  . Smoking status: Never Smoker  . Smokeless tobacco: Never Used  Vaping Use  . Vaping Use: Never used  Substance Use Topics  . Alcohol use: No  . Drug use: No    Home Medications Prior to Admission medications   Medication Sig Start Date End Date Taking? Authorizing Provider  albuterol (PROAIR HFA) 108 (90 Base) MCG/ACT inhaler Inhale 2 puffs into the lungs every 4 (four) hours as needed for wheezing or shortness of breath. 10/28/19   Parrett, Fonnie Mu, NP  allopurinol (ZYLOPRIM) 300 MG tablet Take 300 mg by mouth daily.    [provider]  amLODipine (NORVASC) 2.5 MG tablet Take 1 tablet (2.5 mg total) by mouth 2 (two) times daily. 02/10/20   Lorretta Harp, MD  atorvastatin (LIPITOR) 40 MG tablet Take 40 mg by mouth daily. 01/23/20   [provider]  diclofenac sodium (VOLTAREN) 1 % GEL Apply 1 application topically daily as needed for pain. Right great toe 04/08/17   [provider]  DULoxetine (CYMBALTA) 60 MG capsule Take 60 mg by mouth daily.    [provider]  fluorouracil (Ranlo)  5 % cream  01/03/20   [provider]  fluticasone (FLONASE) 50 MCG/ACT nasal spray Place 1 spray into both nostrils daily. 07/06/17   [provider]  furosemide (LASIX) 20 MG tablet Take 20 mg by mouth.    [provider]  gabapentin (NEURONTIN) 100 MG capsule Take 400 mg by mouth 2 (two) times daily.    [provider]  glipiZIDE (GLUCOTROL) 5 MG tablet Take 5 mg by mouth daily before breakfast.    [provider]  guaiFENesin (MUCINEX) 600 MG 12 hr tablet Take 1 tablet (600 mg total) by mouth 2 (two) times daily as needed for cough or to loosen phlegm. 11/21/17   Chesley Mires, MD  iron polysaccharides (NIFEREX) 150 MG capsule Take 150 mg by mouth  daily.    [provider]  LORazepam (ATIVAN) 0.5 MG tablet Take 1 mg by mouth 3 times/day as needed-between meals & bedtime for sleep.     [provider]  losartan (COZAAR) 50 MG tablet Take 1 tablet (50 mg total) by mouth daily. 02/10/20   Lorretta Harp, MD  meclizine (ANTIVERT) 12.5 MG tablet Take 12.5 mg by mouth 3 (three) times daily as needed for dizziness.    [provider]  Multiple Vitamins-Minerals (MULTIVITAMIN WITH MINERALS) tablet Take 1 tablet by mouth daily. 04/05/16   [provider]  nebivolol (BYSTOLIC) 5 MG tablet Take 1 tablet (5 mg total) by mouth daily. 12/09/19   Lorretta Harp, MD  nilotinib (TASIGNA) 200 MG capsule Take 200 mg by mouth every 12 (twelve) hours. Give on an empty stomach 1 hour before or 2 hours after meals.    [provider]  omeprazole (PRILOSEC) 40 MG capsule Take 40 mg by mouth daily.    [provider]  potassium chloride (KLOR-CON) 20 MEQ packet Take by mouth 2 (two) times daily.    [provider]  pravastatin (PRAVACHOL) 10 MG tablet Take 10 mg by mouth daily.    [provider]  predniSONE (DELTASONE) 10 MG tablet  01/13/20   [provider]  rOPINIRole (REQUIP) 1 MG tablet Take 4 mg by mouth at bedtime.     [provider]  SYMBICORT 160-4.5 MCG/ACT inhaler INHALE TWO PUFFS BY MOUTH TWICE A DAY 05/04/20   Parrett, Tammy S, NP  tiZANidine (ZANAFLEX) 4 MG capsule Take 4 mg by mouth at bedtime.     [provider]  traMADol (ULTRAM) 50 MG tablet Take by mouth every 6 (six) hours as needed.    [provider]  triamcinolone ointment (KENALOG) 0.1 % Use on the affected areas twice daily. Not to face/groin/armpits 01/13/20   [provider]  TRULICITY 1.61 WR/6.0AV SOPN  01/02/20   [provider]    Allergies    Cortisone, Pollen extract, Ace inhibitors, Asa [aspirin], Latex, Morphine and related, Neosporin  [bacitracin-polymyxin b], and Tylenol [acetaminophen]  Review of Systems   Review of Systems  Constitutional: Positive for activity change, chills, fatigue and fever.  Respiratory: Positive for cough and shortness of breath.   Gastrointestinal: Negative for abdominal pain, diarrhea, nausea and vomiting.  Genitourinary: Negative for dysuria and urgency.  Musculoskeletal: Negative for back pain.  Skin: Negative for color change and rash.  Neurological: Negative for weakness.  Psychiatric/Behavioral: Negative for confusion.  All other systems reviewed and are negative.   Physical Exam Updated Vital Signs BP (!) 139/74   Pulse 74   Temp 98.5 F (36.9 C) (Oral)  Resp 20   Ht 5' (1.524 m)   Wt (!) 98.4 kg   SpO2 94%   BMI 42.38 kg/m   Physical Exam Vitals and nursing note reviewed.  Constitutional:      General: She is not in acute distress.    Appearance: She is well-developed. She is not toxic-appearing or diaphoretic.  HENT:     Head: Normocephalic and atraumatic.     Right Ear: External ear normal.     Left Ear: External ear normal.     Nose: Nose normal.  Eyes:     General: No scleral icterus.       Right eye: No discharge.        Left eye: No discharge.     Conjunctiva/sclera: Conjunctivae normal.     Pupils: Pupils are equal, round, and reactive to light.  Neck:     Trachea: No tracheal deviation.  Cardiovascular:     Rate and Rhythm: Normal rate and regular rhythm.     Heart sounds: Normal heart sounds. No murmur heard.  No friction rub. No gallop.   Pulmonary:     Effort: Pulmonary effort is normal. No respiratory distress.     Breath sounds: Normal breath sounds. No stridor. No decreased breath sounds or wheezing.  Chest:     Chest wall: No tenderness.  Abdominal:     General: Bowel sounds are normal. There is no distension.     Palpations: Abdomen is soft.     Tenderness: There is no abdominal tenderness. There is no guarding.  Musculoskeletal:         General: No deformity. Normal range of motion.     Cervical back: Normal range of motion and neck supple.     Right lower leg: Tenderness present. Edema present.     Left lower leg: Tenderness present. Edema present.     Comments: Right calf is larger than left calf.  +1 pitting edema bilaterally.  Skin:    General: Skin is warm and dry.  Neurological:     General: No focal deficit present.     Mental Status: She is alert.     Cranial Nerves: No cranial nerve deficit.     Sensory: No sensory deficit.     Motor: No abnormal muscle tone.  Psychiatric:        Mood and Affect: Mood normal.        Behavior: Behavior normal.     ED Results / Procedures / Treatments   Labs (all labs ordered are listed, but only abnormal results are displayed) Labs Reviewed  COMPREHENSIVE METABOLIC PANEL - Abnormal; Notable for the following components:      Result Value   Glucose, Bld 203 (*)    Calcium 8.8 (*)    AST 47 (*)    Alkaline Phosphatase 135 (*)    All other components within normal limits  CBC WITH DIFFERENTIAL/PLATELET - Abnormal; Notable for the following components:   Basophils Absolute 0.2 (*)    All other components within normal limits  BRAIN NATRIURETIC PEPTIDE - Abnormal; Notable for the following components:   B Natriuretic Peptide 182.0 (*)    All other components within normal limits  URINALYSIS, ROUTINE W REFLEX MICROSCOPIC - Abnormal; Notable for the following components:   APPearance HAZY (*)    All other components within normal limits  SARS CORONAVIRUS 2 BY RT PCR (HOSPITAL ORDER, [place] [facility])    EKG EKG Interpretation  Date/Time:  Saturday  June 23 2020 13:20:25 EDT Ventricular Rate:  83 PR Interval:  178 QRS Duration: 78 QT Interval:  328 QTC Calculation: 385 R Axis:   -77 Text Interpretation: Normal sinus rhythm Left axis deviation Pulmonary disease pattern Septal infarct , age undetermined Abnormal ECG No significant change since  last tracing Confirmed by Theotis Burrow [phone]) on 06/23/2020 10:05:28 PM   Radiology CT Angio Chest PE W/Cm &/Or Wo Cm  Result Date: 06/23/2020 CLINICAL DATA:  Shortness of breath EXAM: CT ANGIOGRAPHY CHEST WITH CONTRAST TECHNIQUE: Multidetector CT imaging of the chest was performed using the standard protocol during bolus administration of intravenous contrast. Multiplanar CT image reconstructions and MIPs were obtained to evaluate the vascular anatomy. CONTRAST:  34mL OMNIPAQUE IOHEXOL 350 MG/ML SOLN COMPARISON:  None. FINDINGS: Cardiovascular: There is a optimal opacification of the pulmonary arteries. There is no central,segmental, or subsegmental filling defects within the pulmonary arteries. There is mild cardiomegaly. No pericardial effusion or thickening. No evidence right heart strain. There is normal three-vessel brachiocephalic anatomy without proximal stenosis. Scattered aortic atherosclerosis is noted. Mediastinum/Nodes: No hilar, mediastinal, or axillary adenopathy. Thyroid gland, trachea, and esophagus demonstrate no significant findings. Lungs/Pleura: Mild bibasilar dependent streaky atelectasis is noted. No large airspace consolidation is seen. No pleural effusion or pneumothorax. No airspace consolidation. Upper Abdomen: No acute abnormalities present in the visualized portions of the upper abdomen. Calcifications within the spleen are seen. A small hiatal hernia is present. Musculoskeletal: No chest wall abnormality. No acute or significant osseous findings. Review of the MIP images confirms the above findings. IMPRESSION: No central, segmental, or subsegmental pulmonary embolism. No intrathoracic pathology to explain the patient's symptoms. Aortic Atherosclerosis (ICD10-I70.0). Electronically Signed   By: Prudencio Pair M.D.   On: 06/23/2020 23:25   US Venous Img Lower Right (DVT Study)  Result Date: 06/23/2020 CLINICAL DATA:  Lower extremity edema EXAM: Right LOWER EXTREMITY VENOUS  DOPPLER ULTRASOUND TECHNIQUE: Gray-scale sonography with compression, as well as color and duplex ultrasound, were performed to evaluate the deep venous system(s) from the level of the common femoral vein through the popliteal and proximal calf veins. COMPARISON:  None. FINDINGS: VENOUS Normal compressibility of the common femoral, superficial femoral, and popliteal veins, as well as the visualized calf veins. Visualized portions of profunda femoral vein and great saphenous vein unremarkable. No filling defects to suggest DVT on grayscale or color Doppler imaging. Doppler waveforms show normal direction of venous flow, normal respiratory plasticity and response to augmentation. Limited views of the contralateral common femoral vein are unremarkable. OTHER None. Limitations: none IMPRESSION: Negative. Electronically Signed   By: Prudencio Pair M.D.   On: 06/23/2020 21:09   DG Chest Portable 1 View  Result Date: 06/23/2020 CLINICAL DATA:  Shortness of breath and subjective fevers with altered sense of taste. EXAM: PORTABLE CHEST 1 VIEW COMPARISON:  10/28/2019 FINDINGS: Trachea is midline. Cardiomediastinal contours stable enlarged accounting for AP projection and portable technique. Hilar structures with suggestion of central vascular engorgement. No frank edema. No consolidation. No sign of pleural effusion. Skeletal structures on limited assessment without acute process. IMPRESSION: Cardiomegaly with central vascular engorgement. No frank edema or consolidation. Electronically Signed   By: Zetta Bills M.D.   On: 06/23/2020 14:13    Procedures Procedures (including critical care time)  Medications Ordered in ED Medications  iohexol (OMNIPAQUE) 350 MG/ML injection 80 mL (80 mLs Intravenous Contrast Given 06/23/20 2250)    ED Course  I have reviewed the triage vital signs and the nursing notes.  Pertinent labs & imaging results that were available during my care of the patient were reviewed by me and  considered in my medical decision making (see chart for details).    MDM Rules/Calculators/A&P                         Patient is a 74 year old woman who presents today for evaluation of multiple concerns, her primary is shortness of breath.  On my exam her right leg does appear slightly larger than the left primarily around the calf.  She reports what sounds like multiple viral symptoms including fatigue, cough, shortness of breath.  Chest x-ray was obtained showing possible cardiomegaly without frank edema or consolidation.DVT study of the right lower extremity was obtained without evidence of DVT.  Her Covid test is negative.  Labs are obtained and reviewed, CBC is unremarkable.  CMP shows mild hyperglycemia with no significant elevations otherwise.  UA does not show evidence of infection.  BNP is slightly elevated at 182 however do not have prior for comparisons.  CTA PE study was obtained without evidence of PE, consolidation or other cause for her symptoms found.    Recommended out patient follow up with PCP and pulmonologist.  Her lungs are CTAB.  No indication for bronchodilators at this time.  Given lack of edema on her CT scan will not start.  On increased Lasix at this time, recommended compliance with home medications in addition to healthy balanced diet.  PCP follow-up.  Return precautions were discussed with patient who states their understanding.  At the time of discharge patient denied any unaddressed complaints or concerns.  Patient is agreeable for discharge home.  Note: Portions of this report may have been transcribed using voice recognition software. Every effort was made to ensure accuracy; however, inadvertent computerized transcription errors may be present  Final Clinical Impression(s) / ED Diagnoses Final diagnoses:  Shortness of breath    Rx / DC Orders ED Discharge Orders    None       Lorin Glass, PA-C 06/24/20 0004    Little, Wenda Overland,  MD 06/26/20 [phone]

## 2020-06-23 NOTE — Discharge Instructions (Signed)
Please schedule an appointment with your primary care doctor and your pulmonologist.  Please make sure you are taking your medication and eating a healthy diet.

## 2020-06-23 NOTE — ED Triage Notes (Signed)
Pt c/o ShOB, cough, sore throat, joint aches, no energy, altered taste, subjective fevers x 1.5 weeks.

## 2020-07-02 ENCOUNTER — Telehealth: Payer: Self-pay | Admitting: Pulmonary Disease

## 2020-07-02 ENCOUNTER — Encounter (HOSPITAL_COMMUNITY): Payer: Self-pay | Admitting: Emergency Medicine

## 2020-07-02 ENCOUNTER — Other Ambulatory Visit: Payer: Self-pay

## 2020-07-02 ENCOUNTER — Inpatient Hospital Stay (HOSPITAL_COMMUNITY)
Admission: EM | Admit: 2020-07-02 | Discharge: 2020-07-06 | DRG: 291 | Disposition: A | Discharge status: Home Health | Payer: Medicare Other | Attending: Internal Medicine | Admitting: Internal Medicine

## 2020-07-02 ENCOUNTER — Emergency Department (HOSPITAL_COMMUNITY): Payer: Medicare Other

## 2020-07-02 DIAGNOSIS — F329 Major depressive disorder, single episode, unspecified: Secondary | ICD-10-CM | POA: Diagnosis present

## 2020-07-02 DIAGNOSIS — J9601 Acute respiratory failure with hypoxia: Secondary | ICD-10-CM | POA: Diagnosis present

## 2020-07-02 DIAGNOSIS — E1142 Type 2 diabetes mellitus with diabetic polyneuropathy: Secondary | ICD-10-CM | POA: Diagnosis present

## 2020-07-02 DIAGNOSIS — G4733 Obstructive sleep apnea (adult) (pediatric): Secondary | ICD-10-CM

## 2020-07-02 DIAGNOSIS — L219 Seborrheic dermatitis, unspecified: Secondary | ICD-10-CM | POA: Diagnosis present

## 2020-07-02 DIAGNOSIS — K219 Gastro-esophageal reflux disease without esophagitis: Secondary | ICD-10-CM | POA: Diagnosis present

## 2020-07-02 DIAGNOSIS — E11649 Type 2 diabetes mellitus with hypoglycemia without coma: Secondary | ICD-10-CM | POA: Diagnosis not present

## 2020-07-02 DIAGNOSIS — I10 Essential (primary) hypertension: Secondary | ICD-10-CM | POA: Diagnosis present

## 2020-07-02 DIAGNOSIS — Z79899 Other long term (current) drug therapy: Secondary | ICD-10-CM

## 2020-07-02 DIAGNOSIS — K7460 Unspecified cirrhosis of liver: Secondary | ICD-10-CM | POA: Diagnosis present

## 2020-07-02 DIAGNOSIS — E669 Obesity, unspecified: Secondary | ICD-10-CM | POA: Diagnosis present

## 2020-07-02 DIAGNOSIS — R0602 Shortness of breath: Secondary | ICD-10-CM | POA: Diagnosis present

## 2020-07-02 DIAGNOSIS — K760 Fatty (change of) liver, not elsewhere classified: Secondary | ICD-10-CM | POA: Diagnosis present

## 2020-07-02 DIAGNOSIS — Z90710 Acquired absence of both cervix and uterus: Secondary | ICD-10-CM | POA: Diagnosis not present

## 2020-07-02 DIAGNOSIS — I5033 Acute on chronic diastolic (congestive) heart failure: Secondary | ICD-10-CM | POA: Diagnosis present

## 2020-07-02 DIAGNOSIS — G2581 Restless legs syndrome: Secondary | ICD-10-CM | POA: Diagnosis present

## 2020-07-02 DIAGNOSIS — Z20822 Contact with and (suspected) exposure to covid-19: Secondary | ICD-10-CM | POA: Diagnosis present

## 2020-07-02 DIAGNOSIS — Z888 Allergy status to other drugs, medicaments and biological substances status: Secondary | ICD-10-CM

## 2020-07-02 DIAGNOSIS — I429 Cardiomyopathy, unspecified: Secondary | ICD-10-CM | POA: Diagnosis present

## 2020-07-02 DIAGNOSIS — Z856 Personal history of leukemia: Secondary | ICD-10-CM

## 2020-07-02 DIAGNOSIS — Z6841 Body Mass Index (BMI) 40.0 and over, adult: Secondary | ICD-10-CM | POA: Diagnosis not present

## 2020-07-02 DIAGNOSIS — Z883 Allergy status to other anti-infective agents status: Secondary | ICD-10-CM

## 2020-07-02 DIAGNOSIS — R0603 Acute respiratory distress: Secondary | ICD-10-CM

## 2020-07-02 DIAGNOSIS — L719 Rosacea, unspecified: Secondary | ICD-10-CM | POA: Diagnosis present

## 2020-07-02 DIAGNOSIS — Z7951 Long term (current) use of inhaled steroids: Secondary | ICD-10-CM

## 2020-07-02 DIAGNOSIS — M549 Dorsalgia, unspecified: Secondary | ICD-10-CM | POA: Diagnosis present

## 2020-07-02 DIAGNOSIS — G8929 Other chronic pain: Secondary | ICD-10-CM | POA: Diagnosis present

## 2020-07-02 DIAGNOSIS — I509 Heart failure, unspecified: Secondary | ICD-10-CM

## 2020-07-02 DIAGNOSIS — E668 Other obesity: Secondary | ICD-10-CM | POA: Diagnosis present

## 2020-07-02 DIAGNOSIS — I110 Hypertensive heart disease with heart failure: Secondary | ICD-10-CM | POA: Diagnosis present

## 2020-07-02 DIAGNOSIS — M109 Gout, unspecified: Secondary | ICD-10-CM | POA: Diagnosis present

## 2020-07-02 DIAGNOSIS — Z881 Allergy status to other antibiotic agents status: Secondary | ICD-10-CM

## 2020-07-02 DIAGNOSIS — G43909 Migraine, unspecified, not intractable, without status migrainosus: Secondary | ICD-10-CM | POA: Diagnosis present

## 2020-07-02 DIAGNOSIS — Z886 Allergy status to analgesic agent status: Secondary | ICD-10-CM

## 2020-07-02 DIAGNOSIS — E785 Hyperlipidemia, unspecified: Secondary | ICD-10-CM | POA: Diagnosis present

## 2020-07-02 DIAGNOSIS — Z885 Allergy status to narcotic agent status: Secondary | ICD-10-CM

## 2020-07-02 DIAGNOSIS — J301 Allergic rhinitis due to pollen: Secondary | ICD-10-CM | POA: Diagnosis present

## 2020-07-02 DIAGNOSIS — Z7984 Long term (current) use of oral hypoglycemic drugs: Secondary | ICD-10-CM

## 2020-07-02 DIAGNOSIS — R0600 Dyspnea, unspecified: Secondary | ICD-10-CM

## 2020-07-02 DIAGNOSIS — R600 Localized edema: Secondary | ICD-10-CM | POA: Diagnosis present

## 2020-07-02 LAB — COMPREHENSIVE METABOLIC PANEL
ALT: 31 U/L (ref 0–44)
AST: 37 U/L (ref 15–41)
Albumin: 4.2 g/dL (ref 3.5–5.0)
Alkaline Phosphatase: 112 U/L (ref 38–126)
Anion gap: 9 (ref 5–15)
BUN: 15 mg/dL (ref 8–23)
CO2: 27 mmol/L (ref 22–32)
Calcium: 9.2 mg/dL (ref 8.9–10.3)
Chloride: 107 mmol/L (ref 98–111)
Creatinine, Ser: 0.77 mg/dL (ref 0.44–1.00)
GFR calc Af Amer: >60 mL/min (ref >60)
GFR calc non Af Amer: >60 mL/min (ref >60)
Glucose, Bld: 122 mg/dL — ABNORMAL HIGH (ref 70–99)
Potassium: 3.7 mmol/L (ref 3.5–5.1)
Sodium: 143 mmol/L (ref 135–145)
Total Bilirubin: 2 mg/dL — ABNORMAL HIGH (ref 0.3–1.2)
Total Protein: 8 g/dL (ref 6.5–8.1)

## 2020-07-02 LAB — BLOOD GAS, ARTERIAL
Acid-Base Excess: 0.8 mmol/L (ref 0.0–2.0)
Bicarbonate: 24.3 mmol/L (ref 20.0–28.0)
Drawn by: 257701
O2 Saturation: 89.9 %
Patient temperature: 98.6
pCO2 arterial: 36.8 mmHg (ref 32.0–48.0)
pH, Arterial: 7.435 (ref 7.350–7.450)
pO2, Arterial: 58.2 mmHg — ABNORMAL LOW (ref 83.0–108.0)

## 2020-07-02 LAB — PROCALCITONIN: Procalcitonin: <0.1 ng/mL

## 2020-07-02 LAB — CBC
HCT: 36.7 % (ref 36.0–46.0)
Hemoglobin: 11.5 g/dL — ABNORMAL LOW (ref 12.0–15.0)
MCH: 27.1 pg (ref 26.0–34.0)
MCHC: 31.3 g/dL (ref 30.0–36.0)
MCV: 86.4 fL (ref 80.0–100.0)
Platelets: 269 10*3/uL (ref 150–400)
RBC: 4.25 MIL/uL (ref 3.87–5.11)
RDW: 15.1 % (ref 11.5–15.5)
WBC: 13 10*3/uL — ABNORMAL HIGH (ref 4.0–10.5)
nRBC: 0 % (ref 0.0–0.2)

## 2020-07-02 LAB — BRAIN NATRIURETIC PEPTIDE: B Natriuretic Peptide: 390.8 pg/mL — ABNORMAL HIGH (ref 0.0–100.0)

## 2020-07-02 LAB — SARS CORONAVIRUS 2 BY RT PCR (HOSPITAL ORDER, PERFORMED IN ~~LOC~~ HOSPITAL LAB): SARS Coronavirus 2: NEGATIVE

## 2020-07-02 LAB — CBG MONITORING, ED
Glucose-Capillary: 88 mg/dL (ref 70–99)
Glucose-Capillary: 103 mg/dL — ABNORMAL HIGH (ref 70–99)
Glucose-Capillary: 111 mg/dL — ABNORMAL HIGH (ref 70–99)

## 2020-07-02 MED ORDER — ATORVASTATIN CALCIUM 40 MG PO TABS
40.0000 mg | ORAL_TABLET | Freq: Every day | ORAL | Status: DC
  Administered 2020-07-03 – 2020-07-06 (×4): 40 mg via ORAL
  Filled 2020-07-02 (×4): qty 1

## 2020-07-02 MED ORDER — GABAPENTIN 400 MG PO CAPS
1600.0000 mg | ORAL_CAPSULE | Freq: Two times a day (BID) | ORAL | Status: DC
  Administered 2020-07-02 – 2020-07-06 (×8): 1600 mg via ORAL
  Filled 2020-07-02 (×8): qty 4

## 2020-07-02 MED ORDER — INSULIN ASPART 100 UNIT/ML ~~LOC~~ SOLN
0.0000 [IU] | Freq: Every day | SUBCUTANEOUS | Status: DC
  Filled 2020-07-02: qty 0.05

## 2020-07-02 MED ORDER — SODIUM CHLORIDE 0.9% FLUSH
3.0000 mL | Freq: Two times a day (BID) | INTRAVENOUS | Status: DC
  Administered 2020-07-02 – 2020-07-05 (×7): 3 mL via INTRAVENOUS

## 2020-07-02 MED ORDER — FUROSEMIDE 10 MG/ML IJ SOLN
40.0000 mg | Freq: Once | INTRAMUSCULAR | Status: AC
  Administered 2020-07-02: 40 mg via INTRAVENOUS
  Filled 2020-07-02: qty 4

## 2020-07-02 MED ORDER — NEBIVOLOL HCL 5 MG PO TABS
5.0000 mg | ORAL_TABLET | Freq: Every day | ORAL | Status: DC
  Administered 2020-07-03 – 2020-07-06 (×4): 5 mg via ORAL
  Filled 2020-07-02 (×5): qty 1

## 2020-07-02 MED ORDER — FUROSEMIDE 10 MG/ML IJ SOLN
40.0000 mg | Freq: Two times a day (BID) | INTRAMUSCULAR | Status: DC
  Administered 2020-07-03 (×2): 40 mg via INTRAVENOUS
  Filled 2020-07-02 (×2): qty 4

## 2020-07-02 MED ORDER — INSULIN ASPART 100 UNIT/ML ~~LOC~~ SOLN
2.0000 [IU] | Freq: Three times a day (TID) | SUBCUTANEOUS | Status: DC
  Administered 2020-07-02 – 2020-07-06 (×6): 2 [IU] via SUBCUTANEOUS
  Filled 2020-07-02: qty 0.02

## 2020-07-02 MED ORDER — ONDANSETRON HCL 4 MG/2ML IJ SOLN: 4.0000 mg | Freq: Four times a day (QID) | INTRAMUSCULAR | Status: DC | PRN

## 2020-07-02 MED ORDER — SODIUM CHLORIDE 0.9 % IV SOLN: 250.0000 mL | INTRAVENOUS | Status: DC | PRN

## 2020-07-02 MED ORDER — AMLODIPINE BESYLATE 10 MG PO TABS
10.0000 mg | ORAL_TABLET | Freq: Every day | ORAL | Status: DC
  Administered 2020-07-03 – 2020-07-06 (×4): 10 mg via ORAL
  Filled 2020-07-02 (×4): qty 1

## 2020-07-02 MED ORDER — SODIUM CHLORIDE (PF) 0.9 % IJ SOLN
INTRAMUSCULAR | Status: AC
  Filled 2020-07-02: qty 50

## 2020-07-02 MED ORDER — ROPINIROLE HCL 1 MG PO TABS
3.0000 mg | ORAL_TABLET | Freq: Every day | ORAL | Status: DC
  Administered 2020-07-02 – 2020-07-05 (×4): 3 mg via ORAL
  Filled 2020-07-02 (×4): qty 3

## 2020-07-02 MED ORDER — ACETAMINOPHEN 325 MG PO TABS
650.0000 mg | ORAL_TABLET | Freq: Four times a day (QID) | ORAL | Status: DC | PRN
  Administered 2020-07-03: 650 mg via ORAL
  Filled 2020-07-02: qty 2

## 2020-07-02 MED ORDER — POLYETHYLENE GLYCOL 3350 17 G PO PACK: 17.0000 g | PACK | Freq: Every day | ORAL | Status: DC | PRN

## 2020-07-02 MED ORDER — DULOXETINE HCL 60 MG PO CPEP
60.0000 mg | ORAL_CAPSULE | Freq: Every day | ORAL | Status: DC
  Administered 2020-07-03 – 2020-07-06 (×4): 60 mg via ORAL
  Filled 2020-07-02 (×2): qty 2
  Filled 2020-07-02 (×2): qty 1

## 2020-07-02 MED ORDER — ACETAMINOPHEN 325 MG PO TABS
650.0000 mg | ORAL_TABLET | ORAL | Status: DC | PRN
Start: 2020-07-02 — End: 2020-07-02

## 2020-07-02 MED ORDER — SODIUM CHLORIDE 0.9% FLUSH: 3.0000 mL | INTRAVENOUS | Status: DC | PRN

## 2020-07-02 MED ORDER — LOSARTAN POTASSIUM 50 MG PO TABS
50.0000 mg | ORAL_TABLET | Freq: Every day | ORAL | Status: DC
  Administered 2020-07-03 – 2020-07-06 (×4): 50 mg via ORAL
  Filled 2020-07-02 (×5): qty 1

## 2020-07-02 MED ORDER — POTASSIUM CHLORIDE CRYS ER 20 MEQ PO TBCR
40.0000 meq | EXTENDED_RELEASE_TABLET | Freq: Once | ORAL | Status: AC
  Administered 2020-07-02: 40 meq via ORAL
  Filled 2020-07-02: qty 2

## 2020-07-02 MED ORDER — IPRATROPIUM-ALBUTEROL 0.5-2.5 (3) MG/3ML IN SOLN: 3.0000 mL | RESPIRATORY_TRACT | Status: DC | PRN

## 2020-07-02 MED ORDER — INSULIN ASPART 100 UNIT/ML ~~LOC~~ SOLN
0.0000 [IU] | Freq: Three times a day (TID) | SUBCUTANEOUS | Status: DC
  Administered 2020-07-05 – 2020-07-06 (×2): 2 [IU] via SUBCUTANEOUS
  Filled 2020-07-02: qty 0.15

## 2020-07-02 MED ORDER — IOHEXOL 350 MG/ML SOLN
100.0000 mL | Freq: Once | INTRAVENOUS | Status: AC | PRN
  Administered 2020-07-02: 100 mL via INTRAVENOUS

## 2020-07-02 MED ORDER — GLIPIZIDE 5 MG PO TABS
5.0000 mg | ORAL_TABLET | Freq: Every day | ORAL | Status: DC
  Administered 2020-07-03 – 2020-07-04 (×2): 5 mg via ORAL
  Filled 2020-07-02 (×3): qty 1

## 2020-07-02 MED ORDER — ENOXAPARIN SODIUM 40 MG/0.4ML ~~LOC~~ SOLN
40.0000 mg | SUBCUTANEOUS | Status: DC
  Administered 2020-07-02 – 2020-07-05 (×4): 40 mg via SUBCUTANEOUS
  Filled 2020-07-02 (×4): qty 0.4

## 2020-07-02 MED ORDER — SENNOSIDES-DOCUSATE SODIUM 8.6-50 MG PO TABS: 2.0000 | ORAL_TABLET | Freq: Every evening | ORAL | Status: DC | PRN

## 2020-07-02 NOTE — Progress Notes (Signed)
Placed PT on Partial Rebreather at 15 LPM- current Sp02 95%.

## 2020-07-02 NOTE — Telephone Encounter (Signed)
Spoke with the pt's spouse and notified of recs per Dr Halford Chessman and he verbalized understanding.

## 2020-07-02 NOTE — Progress Notes (Signed)
Notified Lab ABG being sent for analysis. 

## 2020-07-02 NOTE — Telephone Encounter (Signed)
Agree she needs to go to emergency room for further assessment.

## 2020-07-02 NOTE — ED Notes (Signed)
Assumed care of patient at this time, nad noted, sr up x2, bed locked and low, call bell w/I reach.  Will continue to monitor.  Spouse at the bedside.

## 2020-07-02 NOTE — Telephone Encounter (Signed)
Spoke with the pt's spouse  He states pt having increased SOB and swelling in her legs for approx 2 wks Took her to [facility] 06/23/20 and was tested for covid and this was neg  He states that they advised that she f/u here but she did not  She is having worsening SOB per spouse "panting" and can not say a sentence without gasping  I advised may need to go to ED but will check with VS  No cough, fever, worsening edema, CP  We have no appts this wk unfortunately  Please advise thanks

## 2020-07-02 NOTE — H&P (Signed)
History and Physical    Wanda Hanson JEH:[id number] DOB: 08/17/1946 DOA: 07/02/2020  PCP: Parke Poisson, MD Patient coming from: Home  Chief Complaint: Shortness of breath  HPI: Wanda Hanson is a 74 y.o. female with medical history significant of DM2, HTN, fatty liver, HLD, peripheral neuropathy, gout, GERD, diastolic CHF, OSA on CPAP comes to the hospital for evaluation of shortness of breath.  Patient states she has been feeling progressive shortness of breath with exertion and bilateral lower extremity swelling for past several weeks.  Last week she thought thought it might have been secondary to Covid therefore her husband took her to an urgent care where she was tested negative for COVID-19.  She also received care for short of Covid vaccination around that time.  Has been feeling fatigue during this time with some chills and intermittent low-grade fevers.  Today in the ER patient has some signs of volume overload, CTA chest negative for PE but shows cardiomegaly, pulmonary edema and cirrhosis.  BNP is mildly elevated, Lasix was ordered.  Medical team requested to admit the patient as she was requiring nonrebreather.  When I saw the patient in the ER she was laying flat and saturating 80% on nonrebreather.  I was able to sit up the patient and her oxygen saturation improved to 85-87%.  Lasix has not been given yet.  Husband present at bedside.   Review of Systems: As per HPI otherwise 10 point review of systems negative.  Review of Systems Otherwise negative except as per HPI, including: General: Denies night sweats or unintended weight loss. Resp: Denies hemoptysis Cardiac: Denies chest pain, palpitations, orthopnea, paroxysmal nocturnal dyspnea. GI: Denies abdominal pain, nausea, vomiting, diarrhea or constipation GU: Denies dysuria, frequency, hesitancy or incontinence MS: Denies muscle aches, joint pain or swelling Neuro: Denies headache, neurologic deficits (focal  weakness, numbness, tingling), abnormal gait Psych: Denies anxiety, depression, SI/HI/AVH Skin: Denies new rashes or lesions ID: Denies sick contacts, exotic exposures, travel  Past Medical History:  Diagnosis Date  . Chronic back pain   . 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 Depression   . Diabetes mellitus ([place])   . Eczema   . Fatty liver   . GERD (gastroesophageal reflux disease)   . Gout   . Hyperlipidemia   . Hypertension   . Migraine   . OSA (obstructive sleep apnea)   . Peripheral neuropathy   . Restless leg syndrome   . Rosacea   . Seborrheic dermatitis     Past Surgical History:  Procedure Laterality Date  . ABDOMINAL HYSTERECTOMY    . BACK SURGERY    . JOINT REPLACEMENT      SOCIAL HISTORY:  reports that she has never smoked. She has never used smokeless tobacco. She reports that she does not drink alcohol and does not use drugs.  Allergies  Allergen Reactions  . Benzalkonium Chloride Dermatitis and Swelling    At The Site of Application At The Site of Application   . Cortisone Swelling  . Neomycin-Bacitracin-Polymyxin [Bacitracin-Neomycin-Polymyxin] Swelling  . Pollen Extract Other (See Comments)    Typical "Hay Fever" symptoms Stuffy nose  . Ace Inhibitors Swelling  . Asa [Aspirin]     Ulcer   . Latex   . Morphine And Related     SOB   . Neosporin [Bacitracin-Polymyxin B]     Swelling where applied   . Tylenol [Acetaminophen]     Ulcer     FAMILY HISTORY: Family History  Problem Relation  Age of Onset  . Lung cancer Father      Prior to Admission medications   Medication Sig Start Date End Date Taking? Authorizing Provider  acetaminophen (TYLENOL) 325 MG tablet Take 325 mg by mouth every 6 (six) hours as needed for mild pain or headache.   Yes [provider]  allopurinol (ZYLOPRIM) 300 MG tablet Take 300 mg by mouth daily.   Yes [provider]  amLODipine (NORVASC) 5 MG tablet Take 10 mg by mouth daily.    Yes [provider]  atorvastatin (LIPITOR) 40 MG tablet Take 40 mg by mouth daily. 01/23/20  Yes [provider]  diclofenac Sodium (VOLTAREN) 1 % GEL Apply 2 g topically 4 (four) times daily as needed (right toe pain).   Yes [provider]  DULoxetine (CYMBALTA) 60 MG capsule Take 60 mg by mouth daily.   Yes [provider]  furosemide (LASIX) 20 MG tablet Take 20 mg by mouth 2 (two) times daily.    Yes [provider]  gabapentin (NEURONTIN) 800 MG tablet Take 1,600 mg by mouth 2 (two) times daily.  06/27/20  Yes [provider]  glipiZIDE (GLUCOTROL) 5 MG tablet Take 5 mg by mouth daily before breakfast.   Yes [provider]  LORazepam (ATIVAN) 0.5 MG tablet Take 1 mg by mouth at bedtime.    Yes [provider]  losartan (COZAAR) 50 MG tablet Take 1 tablet (50 mg total) by mouth daily. 02/10/20  Yes Lorretta Harp, MD  nebivolol (BYSTOLIC) 5 MG tablet Take 1 tablet (5 mg total) by mouth daily. 12/09/19  Yes Lorretta Harp, MD  nilotinib (TASIGNA) 200 MG capsule Take 200 mg by mouth every 12 (twelve) hours. Give on an empty stomach 1 hour before or 2 hours after meals.   Yes [provider]  polyvinyl alcohol (LIQUIFILM TEARS) 1.4 % ophthalmic solution Place 1 drop into both eyes as needed for dry eyes.   Yes [provider]  Potassium Chloride ER 20 MEQ TBCR Take 20 mEq by mouth daily. 05/15/20  Yes [provider]  rOPINIRole (REQUIP) 1 MG tablet Take 3 mg by mouth at bedtime.    Yes [provider]  SYMBICORT 160-4.5 MCG/ACT inhaler INHALE TWO PUFFS BY MOUTH TWICE A DAY Patient taking differently: Inhale 2 puffs into the lungs 2 (two) times daily.  05/04/20  Yes Parrett, Tammy S, NP  TRULICITY 3.15 VV/6.1YW SOPN Inject 0.75 mg into the skin once a week.  01/02/20  Yes [provider]  albuterol (PROAIR HFA) 108 (90 Base) MCG/ACT inhaler Inhale 2 puffs into the lungs every 4  (four) hours as needed for wheezing or shortness of breath. Patient not taking: Reported on 07/02/2020 10/28/19   Parrett, Fonnie Mu, NP  amLODipine (NORVASC) 2.5 MG tablet Take 1 tablet (2.5 mg total) by mouth 2 (two) times daily. Patient not taking: Reported on 07/02/2020 02/10/20   Lorretta Harp, MD  guaiFENesin (MUCINEX) 600 MG 12 hr tablet Take 1 tablet (600 mg total) by mouth 2 (two) times daily as needed for cough or to loosen phlegm. Patient not taking: Reported on 07/02/2020 11/21/17   Chesley Mires, MD    Physical Exam: Vitals:   07/02/20 1459 07/02/20 1500 07/02/20 1515 07/02/20 1630  BP: 124/70 123/67 123/71 134/73  Pulse: 72 72 73 79  Resp: 16 (!) 21 (!) 22 16  Temp:    97.6 F (36.4 C)  TempSrc:    Oral  SpO2: 96% 96% 94% 94%  Weight:      Height:          Constitutional: Not in acute distress, on nonrebreather saturating 85%. Eyes: PERRL, lids and conjunctivae normal ENMT: Mucous membranes are moist. Posterior pharynx clear of any exudate or lesions.Normal dentition.  Neck: normal, supple, no masses, no thyromegaly Respiratory: Bilateral diminished breath sounds Cardiovascular: Regular rate and rhythm, no murmurs / rubs / gallops.  2+ bilateral lower extremity pitting edema 2+ pedal pulses. No carotid bruits.  Abdomen: no tenderness, no masses palpated. No hepatosplenomegaly. Bowel sounds positive.  Musculoskeletal: no clubbing / cyanosis. No joint deformity upper and lower extremities. Good ROM, no contractures. Normal muscle tone.  Skin: no rashes, lesions, ulcers. No induration Neurologic: CN 2-12 grossly intact. Sensation intact, DTR normal. Strength 5/5 in all 4.  Psychiatric: Normal judgment and insight. Alert and oriented x 3. Normal mood.     Labs on Admission: I have personally reviewed following labs and imaging studies  CBC: Recent Labs  Lab 07/02/20 1232  WBC 13.0*  HGB 11.5*  HCT 36.7  MCV 86.4  PLT 403   Basic Metabolic Panel: Recent Labs  Lab  07/02/20 1232  NA 143  K 3.7  CL 107  CO2 27  GLUCOSE 122*  BUN 15  CREATININE 0.77  CALCIUM 9.2   GFR: Estimated Creatinine Clearance: 67.1 mL/min (by C-G formula based on SCr of 0.77 mg/dL). Liver Function Tests: Recent Labs  Lab 07/02/20 1232  AST 37  ALT 31  ALKPHOS 112  BILITOT 2.0*  PROT 8.0  ALBUMIN 4.2   No results for input(s): LIPASE, AMYLASE in the last 168 hours. No results for input(s): AMMONIA in the last 168 hours. Coagulation Profile: No results for input(s): INR, PROTIME in the last 168 hours. Cardiac Enzymes: No results for input(s): CKTOTAL, CKMB, CKMBINDEX, TROPONINI in the last 168 hours. BNP (last 3 results) No results for input(s): PROBNP in the last 8760 hours. HbA1C: No results for input(s): HGBA1C in the last 72 hours. CBG: No results for input(s): GLUCAP in the last 168 hours. Lipid Profile: No results for input(s): CHOL, HDL, LDLCALC, TRIG, CHOLHDL, LDLDIRECT in the last 72 hours. Thyroid Function Tests: No results for input(s): TSH, T4TOTAL, FREET4, T3FREE, THYROIDAB in the last 72 hours. Anemia Panel: No results for input(s): VITAMINB12, FOLATE, FERRITIN, TIBC, IRON, RETICCTPCT in the last 72 hours. Urine analysis:    Component Value Date/Time   COLORURINE YELLOW 06/23/2020 2151   APPEARANCEUR HAZY (A) 06/23/2020 2151   LABSPEC 1.020 06/23/2020 2151   PHURINE 5.5 06/23/2020 2151   GLUCOSEU NEGATIVE 06/23/2020 2151   HGBUR NEGATIVE 06/23/2020 2151   BILIRUBINUR NEGATIVE 06/23/2020 2151   KETONESUR NEGATIVE 06/23/2020 2151   PROTEINUR NEGATIVE 06/23/2020 2151   NITRITE NEGATIVE 06/23/2020 2151   LEUKOCYTESUR NEGATIVE 06/23/2020 2151   Sepsis Labs: !!!!!!!!!!!!!!!!!!!!!!!!!!!!!!!!!!!!!!!!!!!! @LABRCNTIP (procalcitonin:4,lacticidven:4) ) Recent Results (from the past 240 hour(s))  SARS Coronavirus 2 by RT PCR (hospital order, performed in [place] hospital lab) Nasopharyngeal Nasopharyngeal Swab     Status: None   Collection  Time: 06/23/20  1:35 PM   Specimen: Nasopharyngeal Swab  Result Value Ref Range Status   SARS Coronavirus 2 NEGATIVE NEGATIVE Final    Comment: (NOTE) SARS-CoV-2 target nucleic acids are NOT DETECTED.  The SARS-CoV-2 RNA is generally detectable in upper and lower respiratory specimens during the acute phase of infection. The lowest concentration of SARS-CoV-2 viral copies this assay can detect is 250 copies / mL.  A negative result does not preclude SARS-CoV-2 infection and should not be used as the sole basis for treatment or other patient management decisions.  A negative result may occur with improper specimen collection / handling, submission of specimen other than nasopharyngeal swab, presence of viral mutation(s) within the areas targeted by this assay, and inadequate number of viral copies (<250 copies / mL). A negative result must be combined with clinical observations, patient history, and epidemiological information.  Fact Sheet for Patients:   StrictlyIdeas.no  Fact Sheet for Healthcare Providers: BankingDealers.co.za  This test is not yet approved or  cleared by the [place] FDA and has been authorized for detection and/or diagnosis of SARS-CoV-2 by FDA under an Emergency Use Authorization (EUA).  This EUA will remain in effect (meaning this test can be used) for the duration of the COVID-19 declaration under Section 564(b)(1) of the Act, 21 U.S.C. section 360bbb-3(b)(1), unless the authorization is terminated or revoked sooner.  Performed at [facility], [address] [place], [place] [address]   SARS Coronavirus 2 by RT PCR (hospital order, performed in [facility] hospital lab) Nasopharyngeal Nasopharyngeal Swab     Status: None   Collection Time: 07/02/20 12:00 PM   Specimen: Nasopharyngeal Swab  Result Value Ref Range Status   SARS Coronavirus 2 NEGATIVE NEGATIVE Final    Comment:  (NOTE) SARS-CoV-2 target nucleic acids are NOT DETECTED.  The SARS-CoV-2 RNA is generally detectable in upper and lower respiratory specimens during the acute phase of infection. The lowest concentration of SARS-CoV-2 viral copies this assay can detect is 250 copies / mL. A negative result does not preclude SARS-CoV-2 infection and should not be used as the sole basis for treatment or other patient management decisions.  A negative result may occur with improper specimen collection / handling, submission of specimen other than nasopharyngeal swab, presence of viral mutation(s) within the areas targeted by this assay, and inadequate number of viral copies (<250 copies / mL). A negative result must be combined with clinical observations, patient history, and epidemiological information.  Fact Sheet for Patients:   StrictlyIdeas.no  Fact Sheet for Healthcare Providers: BankingDealers.co.za  This test is not yet approved or  cleared by the [place] FDA and has been authorized for detection and/or diagnosis of SARS-CoV-2 by FDA under an Emergency Use Authorization (EUA).  This EUA will remain in effect (meaning this test can be used) for the duration of the COVID-19 declaration under Section 564(b)(1) of the Act, 21 U.S.C. section 360bbb-3(b)(1), unless the authorization is terminated or revoked sooner.  Performed at [facility], [place] [address] [place], [place] [address]      Radiological Exams on Admission: CT Angio Chest PE W and/or Wo Contrast  Result Date: 07/02/2020 CLINICAL DATA:  74 year old female with chest pain and shortness of breath. EXAM: CT ANGIOGRAPHY CHEST WITH CONTRAST TECHNIQUE: Multidetector CT imaging of the chest was performed using the standard protocol during bolus administration of intravenous contrast. Multiplanar CT image reconstructions and MIPs were obtained to evaluate the vascular  anatomy. CONTRAST:  197mL OMNIPAQUE IOHEXOL 350 MG/ML SOLN COMPARISON:  Chest CT dated 06/23/2020. FINDINGS: Evaluation of this exam is limited due to respiratory motion artifact. Cardiovascular: There is mild cardiomegaly. No pericardial effusion. Coronary vascular calcification of the LAD. There is mild atherosclerotic calcification of the thoracic aorta. No aneurysmal dilatation or dissection. Evaluation of the pulmonary arteries is limited due to respiratory motion artifact. No pulmonary artery embolus identified. Mediastinum/Nodes:  No hilar adenopathy. Mildly enlarged mediastinal lymph nodes measuring approximately 13 mm in short axis anterior to the carina. Overall interval increase in the size of the mediastinal lymph nodes compared to the CT of 06/23/2020. the esophagus is grossly unremarkable. No mediastinal fluid collection Lungs/Pleura: Small bilateral pleural effusions. There are bilateral lower lobe partial consolidative changes which may represent atelectasis or pneumonia. There is diffuse interstitial and interlobular septal prominence most likely edema. There is no pneumothorax. The central airways are patent. Upper Abdomen: Cirrhosis. Cholecystectomy. Linear calcification of the splenic capsule. Partially visualized small ascites. Musculoskeletal: Degenerative changes of the spine. No acute osseous pathology. Review of the MIP images confirms the above findings. IMPRESSION: 1. No CT evidence of pulmonary embolism. 2. Mild cardiomegaly with findings of CHF and small bilateral pleural effusions. 3. Bilateral lower lobe atelectasis or pneumonia. Clinical correlation and follow-up to resolution recommended. 4. Mildly enlarged mediastinal lymph nodes, likely reactive. 5. Cirrhosis with small ascites. 6. Aortic Atherosclerosis (ICD10-I70.0). Electronically Signed   By: Anner Crete M.D.   On: 07/02/2020 16:32   DG Chest Port 1 View  Result Date: 07/02/2020 CLINICAL DATA:  Shortness of breath EXAM:  PORTABLE CHEST 1 VIEW COMPARISON:  June 23, 2020 chest radiograph and CT angiogram chest FINDINGS: Lungs are clear. Heart is borderline enlarged with pulmonary vascularity normal. No adenopathy. There is aortic atherosclerosis. No bone lesions. IMPRESSION: Heart borderline enlarged.  Lungs clear. Aortic Atherosclerosis (ICD10-I70.0). Electronically Signed   By: Lowella Grip III M.D.   On: 07/02/2020 12:35     All images have been reviewed by me personally.  EKG: Independently reviewed.  Mildly prolonged QTC 519 but no other acute ST-T changes  Assessment/Plan Principal Problem:   Acute respiratory failure with hypoxia (HCC) Active Problems:   Essential hypertension   OSA on CPAP   Moderate obesity   Lower extremity edema   Shortness of breath   Acute hypoxic respiratory failure nonrebreather Acute congestive heart failure with preserved ejection fraction, 60%, class IV -Admit patient to stepdown unit on nonrebreather.  COVID-19-negative.  If necessary place the patient on BiPAP.  Currently awake and comfortable on nonrebreather.  Lasix has not yet been given in the ER, advised nursing staff to administer this as soon as possible. -Lasix IV 40 mg twice daily, strict input and output, fluid restriction, daily weight -Wean off oxygen as deemed appropriate -External female catheter ordered -Repeat echocardiogram -Supportive care, supplemental oxygen.  Monitor electrolytes and replete.  Essential hypertension -On Norvasc, Lasix IV, losartan, Bystolic.  If necessary we can hold off on some antihypertensives while getting diuresis.  Obstructive sleep apnea Morbid obesity with BMI greater than 40 -Bedtime CPAP, switch to BiPAP if needed.   Diabetes mellitus type 2 -Insulin sliding scale and Accu-Chek -A1c-pending -Glipizide 5 mg daily  Hyperlipidemia -Daily Lipitor  Peripheral neuropathy -On Cymbalta and gabapentin   DVT prophylaxis: Lovenox Code Status: Full code Family  Communication: Husband at bedside Consults called: None Admission status: Inpatient admission due to hypoxia requiring nonrebreather  Status is: Inpatient  Remains inpatient appropriate because:Hemodynamically unstable   Dispo: The patient is from: Home              Anticipated d/c is to: Home              Anticipated d/c date is: 3 days              Patient currently is not medically stable to d/c.  Patient is significantly volume overloaded on  nonrebreather, may require BiPAP.  Patient will need to be admitted to the stepdown unit.       Time Spent: 65 minutes.  >50% of the time was devoted to discussing the patients care, assessment, plan and disposition with other care givers along with counseling the patient about the risks and benefits of treatment.    Eulonda Andalon Arsenio Loader MD [facility]  If 7PM-7AM, please contact night-coverage   07/02/2020, 5:03 PM

## 2020-07-02 NOTE — Progress Notes (Signed)
Increased PT [place] 02 system up to 9 LPM with Sp02 86-88%. Increased 02 to 12 LPM to achieve Sp02 of 92-93%. Waiting on ABG results at this time.

## 2020-07-02 NOTE — ED Triage Notes (Signed)
Patient is from home and presents with weakness and SOB following the 1st does of the Covid 19 vaccine. Symptoms are worse with walking. She usually uses her CPAP machine at night, but has found herself using it more often now.   EMS vitals: 96% SPO2 on 6L 140/70 BP 80 HR 20 Resp Rate 166 CBG 98 Temp

## 2020-07-02 NOTE — Progress Notes (Signed)
Placed PT on 7 LPM [place] (High Flow Nasal Cannula)- pre ABG results (Sp02 on 6 LPM standard [place] 88-89%- ABG on 6 LPM Standard [place]).

## 2020-07-02 NOTE — ED Provider Notes (Addendum)
[facility] DEPT Provider Note   CSN: [id number] Arrival date & time: 07/02/20  1134     History Chief Complaint  Patient presents with  . Shortness of Breath  . Weakness    Wanda Hanson is a 74 y.o. female.  HPI  74 yo female ho pneumonia presents today complaining of dyspnea on July 28.  SEen at urgent on July 31.  Covid test negative, patient reports cxr and labs.  States not treated but advised to follow up with Dr.Soot. Patient states worsening dyspnea since and using cpap during day.  Cough resolved.  Wheezing improved.  However, increased doe and associated fatigue and lightheadedness.  Patient has noted increased swelling of legs and ankles.  Patient reports similar symptoms and needed to have bp meds changed  a year ago.   PMD Dr. Clinton Gallant [facility] Dr. Elonda Husky with [facility] Covid vaccine August 3-first phizer     Past Medical History:  Diagnosis Date  . Chronic back pain   . 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 Depression   . Diabetes mellitus ([place])   . Eczema   . Fatty liver   . GERD (gastroesophageal reflux disease)   . Gout   . Hyperlipidemia   . Hypertension   . Migraine   . OSA (obstructive sleep apnea)   . Peripheral neuropathy   . Restless leg syndrome   . Rosacea   . Seborrheic dermatitis     Patient Active Problem List   Diagnosis Date Noted  . Coronary artery calcification seen on CT scan 11/22/2019  . Lower extremity edema 11/22/2019  . Shortness of breath 11/22/2019  . Acute bronchitis 10/28/2019  . Racing heart beat 10/28/2019  . Moderate obesity 02/26/2018  . OSA on CPAP 12/30/2017  . Hypoxia   . Hypokalemia   . Essential hypertension   . Sepsis due to pneumonia ([place]) 11/15/2017    Past Surgical History:  Procedure Laterality Date  . ABDOMINAL HYSTERECTOMY    . BACK SURGERY    . JOINT REPLACEMENT       OB History   No obstetric history on file.     Family History  Problem  Relation Age of Onset  . Lung cancer Father     Social History   Tobacco Use  . Smoking status: Never Smoker  . Smokeless tobacco: Never Used  Vaping Use  . Vaping Use: Never used  Substance Use Topics  . Alcohol use: No  . Drug use: No    Home Medications Prior to Admission medications   Medication Sig Start Date End Date Taking? Authorizing Provider  albuterol (PROAIR HFA) 108 (90 Base) MCG/ACT inhaler Inhale 2 puffs into the lungs every 4 (four) hours as needed for wheezing or shortness of breath. 10/28/19   Parrett, Fonnie Mu, NP  allopurinol (ZYLOPRIM) 300 MG tablet Take 300 mg by mouth daily.    [provider]  amLODipine (NORVASC) 2.5 MG tablet Take 1 tablet (2.5 mg total) by mouth 2 (two) times daily. 02/10/20   Lorretta Harp, MD  atorvastatin (LIPITOR) 40 MG tablet Take 40 mg by mouth daily. 01/23/20   [provider]  diclofenac sodium (VOLTAREN) 1 % GEL Apply 1 application topically daily as needed for pain. Right great toe 04/08/17   [provider]  DULoxetine (CYMBALTA) 60 MG capsule Take 60 mg by mouth daily.    [provider]  fluorouracil (EFUDEX) 5 % cream  01/03/20  [provider]  fluticasone (FLONASE) 50 MCG/ACT nasal spray Place 1 spray into both nostrils daily. 07/06/17   [provider]  furosemide (LASIX) 20 MG tablet Take 20 mg by mouth.    [provider]  gabapentin (NEURONTIN) 100 MG capsule Take 400 mg by mouth 2 (two) times daily.    [provider]  glipiZIDE (GLUCOTROL) 5 MG tablet Take 5 mg by mouth daily before breakfast.    [provider]  guaiFENesin (MUCINEX) 600 MG 12 hr tablet Take 1 tablet (600 mg total) by mouth 2 (two) times daily as needed for cough or to loosen phlegm. 11/21/17   Chesley Mires, MD  iron polysaccharides (NIFEREX) 150 MG capsule Take 150 mg by mouth daily.    [provider]  LORazepam (ATIVAN) 0.5 MG tablet Take 1 mg by mouth 3 times/day  as needed-between meals & bedtime for sleep.     [provider]  losartan (COZAAR) 50 MG tablet Take 1 tablet (50 mg total) by mouth daily. 02/10/20   Lorretta Harp, MD  meclizine (ANTIVERT) 12.5 MG tablet Take 12.5 mg by mouth 3 (three) times daily as needed for dizziness.    [provider]  Multiple Vitamins-Minerals (MULTIVITAMIN WITH MINERALS) tablet Take 1 tablet by mouth daily. 04/05/16   [provider]  nebivolol (BYSTOLIC) 5 MG tablet Take 1 tablet (5 mg total) by mouth daily. 12/09/19   Lorretta Harp, MD  nilotinib (TASIGNA) 200 MG capsule Take 200 mg by mouth every 12 (twelve) hours. Give on an empty stomach 1 hour before or 2 hours after meals.    [provider]  omeprazole (PRILOSEC) 40 MG capsule Take 40 mg by mouth daily.    [provider]  potassium chloride (KLOR-CON) 20 MEQ packet Take by mouth 2 (two) times daily.    [provider]  pravastatin (PRAVACHOL) 10 MG tablet Take 10 mg by mouth daily.    [provider]  predniSONE (DELTASONE) 10 MG tablet  01/13/20   [provider]  rOPINIRole (REQUIP) 1 MG tablet Take 4 mg by mouth at bedtime.     [provider]  SYMBICORT 160-4.5 MCG/ACT inhaler INHALE TWO PUFFS BY MOUTH TWICE A DAY 05/04/20   Parrett, Tammy S, NP  tiZANidine (ZANAFLEX) 4 MG capsule Take 4 mg by mouth at bedtime.     [provider]  traMADol (ULTRAM) 50 MG tablet Take by mouth every 6 (six) hours as needed.    [provider]  triamcinolone ointment (KENALOG) 0.1 % Use on the affected areas twice daily. Not to face/groin/armpits 01/13/20   [provider]  TRULICITY 3.32 RJ/1.8AC SOPN  01/02/20   [provider]    Allergies    Cortisone, Pollen extract, Ace inhibitors, Asa [aspirin], Latex, Morphine and related, Neosporin [bacitracin-polymyxin b], and Tylenol [acetaminophen]  Review of Systems   Review of Systems  Constitutional:  Positive for activity change.  Respiratory: Positive for cough, shortness of breath and wheezing.   Cardiovascular: Positive for leg swelling. Negative for chest pain.  All other systems reviewed and are negative.   Physical Exam Updated Vital Signs There were no vitals taken for this visit.  Physical Exam Vitals and nursing note reviewed.  Constitutional:      General: She is not in acute distress.    Appearance: She is obese. She is not ill-appearing.  HENT:     Head: Normocephalic.     Mouth/Throat:  Mouth: Mucous membranes are moist.  Cardiovascular:     Rate and Rhythm: Normal rate and regular rhythm.  Pulmonary:     Effort: Tachypnea present.     Breath sounds: Examination of the right-lower field reveals decreased breath sounds. Examination of the left-lower field reveals decreased breath sounds. Decreased breath sounds present.  Chest:     Chest wall: No mass.  Musculoskeletal:     Right lower leg: Edema present.     Left lower leg: Edema present.  Skin:    General: Skin is warm and dry.     Capillary Refill: Capillary refill takes less than 2 seconds.  Neurological:     General: No focal deficit present.     Mental Status: She is alert.  Psychiatric:        Mood and Affect: Mood normal.        Behavior: Behavior normal.     ED Results / Procedures / Treatments   Labs (all labs ordered are listed, but only abnormal results are displayed) Labs Reviewed - No data to display  EKG EKG Interpretation  Date/Time:  Monday July 02 2020 12:07:48 EDT Ventricular Rate:  75 PR Interval:    QRS Duration: 92 QT Interval:  455 QTC Calculation: 509 R Axis:   -25 Text Interpretation: Sinus rhythm Borderline left axis deviation Low voltage, precordial leads Borderline T abnormalities, diffuse leads Prolonged QT interval Confirmed by Pattricia Boss [phone]) on 07/02/2020 4:24:40 PM   Radiology CT Angio Chest PE W and/or Wo Contrast  Result Date: 07/02/2020 CLINICAL  DATA:  74 year old female with chest pain and shortness of breath. EXAM: CT ANGIOGRAPHY CHEST WITH CONTRAST TECHNIQUE: Multidetector CT imaging of the chest was performed using the standard protocol during bolus administration of intravenous contrast. Multiplanar CT image reconstructions and MIPs were obtained to evaluate the vascular anatomy. CONTRAST:  125mL OMNIPAQUE IOHEXOL 350 MG/ML SOLN COMPARISON:  Chest CT dated 06/23/2020. FINDINGS: Evaluation of this exam is limited due to respiratory motion artifact. Cardiovascular: There is mild cardiomegaly. No pericardial effusion. Coronary vascular calcification of the LAD. There is mild atherosclerotic calcification of the thoracic aorta. No aneurysmal dilatation or dissection. Evaluation of the pulmonary arteries is limited due to respiratory motion artifact. No pulmonary artery embolus identified. Mediastinum/Nodes: No hilar adenopathy. Mildly enlarged mediastinal lymph nodes measuring approximately 13 mm in short axis anterior to the carina. Overall interval increase in the size of the mediastinal lymph nodes compared to the CT of 06/23/2020. the esophagus is grossly unremarkable. No mediastinal fluid collection Lungs/Pleura: Small bilateral pleural effusions. There are bilateral lower lobe partial consolidative changes which may represent atelectasis or pneumonia. There is diffuse interstitial and interlobular septal prominence most likely edema. There is no pneumothorax. The central airways are patent. Upper Abdomen: Cirrhosis. Cholecystectomy. Linear calcification of the splenic capsule. Partially visualized small ascites. Musculoskeletal: Degenerative changes of the spine. No acute osseous pathology. Review of the MIP images confirms the above findings. IMPRESSION: 1. No CT evidence of pulmonary embolism. 2. Mild cardiomegaly with findings of CHF and small bilateral pleural effusions. 3. Bilateral lower lobe atelectasis or pneumonia. Clinical correlation and  follow-up to resolution recommended. 4. Mildly enlarged mediastinal lymph nodes, likely reactive. 5. Cirrhosis with small ascites. 6. Aortic Atherosclerosis (ICD10-I70.0). Electronically Signed   By: Anner Crete M.D.   On: 07/02/2020 16:32   DG Chest Port 1 View  Result Date: 07/02/2020 CLINICAL DATA:  Shortness of breath EXAM: PORTABLE CHEST 1 VIEW COMPARISON:  June 23, 2020 chest  radiograph and CT angiogram chest FINDINGS: Lungs are clear. Heart is borderline enlarged with pulmonary vascularity normal. No adenopathy. There is aortic atherosclerosis. No bone lesions. IMPRESSION: Heart borderline enlarged.  Lungs clear. Aortic Atherosclerosis (ICD10-I70.0). Electronically Signed   By: Lowella Grip III M.D.   On: 07/02/2020 12:35    Procedures Procedures (including critical care time)  Medications Ordered in ED Medications - No data to display  ED Course  I have reviewed the triage vital signs and the nursing notes.  Pertinent labs & imaging results that were available during my care of the patient were reviewed by me and considered in my medical decision making (see chart for details).    MDM Rules/Calculators/A&P                          74 yo female ho hypertension, osa, dm, ho respiratory failure secondary to pneumonia presents today with worsening dyspnea with some lower extremity swelling.  Patient initially on [place] at 6 l.m.  Now on nrb- unclear when changed to nrb.  However, currently sats at 94% on nrb.  CTA without acute pe, some lower lobe atelectasis vs pneumonia, but patient afebrile and no leukocytosis. Covid test negative.  Patient with effusions and interstitial edema on cta.  Lasix ordered.  Discussed with Dr. Reesa Chew who will see for admission.   Final Clinical Impression(s) / ED Diagnoses Final diagnoses:  Congestive heart failure, unspecified HF chronicity, unspecified heart failure type ([place])  Acute respiratory distress    Rx / DC Orders ED Discharge Orders     None       Pattricia Boss, MD 07/02/20 1705    Pattricia Boss, MD 07/16/20 1422

## 2020-07-03 ENCOUNTER — Inpatient Hospital Stay (HOSPITAL_COMMUNITY): Payer: Medicare Other

## 2020-07-03 DIAGNOSIS — R0602 Shortness of breath: Secondary | ICD-10-CM

## 2020-07-03 LAB — GLUCOSE, CAPILLARY
Glucose-Capillary: 114 mg/dL — ABNORMAL HIGH (ref 70–99)
Glucose-Capillary: 73 mg/dL (ref 70–99)
Glucose-Capillary: 73 mg/dL (ref 70–99)
Glucose-Capillary: 80 mg/dL (ref 70–99)

## 2020-07-03 LAB — CBC
HCT: 35.5 % — ABNORMAL LOW (ref 36.0–46.0)
Hemoglobin: 11.2 g/dL — ABNORMAL LOW (ref 12.0–15.0)
MCH: 27.7 pg (ref 26.0–34.0)
MCHC: 31.5 g/dL (ref 30.0–36.0)
MCV: 87.7 fL (ref 80.0–100.0)
Platelets: 260 10*3/uL (ref 150–400)
RBC: 4.05 MIL/uL (ref 3.87–5.11)
RDW: 15 % (ref 11.5–15.5)
WBC: 12.5 10*3/uL — ABNORMAL HIGH (ref 4.0–10.5)
nRBC: 0 % (ref 0.0–0.2)

## 2020-07-03 LAB — BASIC METABOLIC PANEL
Anion gap: 10 (ref 5–15)
BUN: 14 mg/dL (ref 8–23)
CO2: 25 mmol/L (ref 22–32)
Calcium: 8.8 mg/dL — ABNORMAL LOW (ref 8.9–10.3)
Chloride: 105 mmol/L (ref 98–111)
Creatinine, Ser: 0.81 mg/dL (ref 0.44–1.00)
GFR calc Af Amer: >60 mL/min (ref >60)
GFR calc non Af Amer: >60 mL/min (ref >60)
Glucose, Bld: 119 mg/dL — ABNORMAL HIGH (ref 70–99)
Potassium: 3.9 mmol/L (ref 3.5–5.1)
Sodium: 140 mmol/L (ref 135–145)

## 2020-07-03 LAB — HEMOGLOBIN A1C
Hgb A1c MFr Bld: 6.9 % — ABNORMAL HIGH (ref 4.8–5.6)
Mean Plasma Glucose: 151.33 mg/dL

## 2020-07-03 LAB — MRSA PCR SCREENING: MRSA by PCR: NEGATIVE

## 2020-07-03 LAB — MAGNESIUM: Magnesium: 1.9 mg/dL (ref 1.7–2.4)

## 2020-07-03 LAB — ECHOCARDIOGRAM COMPLETE
Area-P 1/2: 4.49 cm2
Height: 60 in
S' Lateral: 3.3 cm
Weight: 3492.09 oz

## 2020-07-03 MED ORDER — CHLORHEXIDINE GLUCONATE CLOTH 2 % EX PADS
6.0000 | MEDICATED_PAD | Freq: Every day | CUTANEOUS | Status: DC
  Administered 2020-07-03 – 2020-07-04 (×2): 6 via TOPICAL

## 2020-07-03 MED ORDER — MELATONIN 3 MG PO TABS
6.0000 mg | ORAL_TABLET | Freq: Every evening | ORAL | Status: DC | PRN
  Administered 2020-07-03: 6 mg via ORAL
  Filled 2020-07-03: qty 2

## 2020-07-03 MED ORDER — ORAL CARE MOUTH RINSE
15.0000 mL | Freq: Two times a day (BID) | OROMUCOSAL | Status: DC
  Administered 2020-07-03 – 2020-07-06 (×7): 15 mL via OROMUCOSAL

## 2020-07-03 MED ORDER — FAMOTIDINE 20 MG PO TABS
20.0000 mg | ORAL_TABLET | Freq: Every day | ORAL | Status: DC
  Administered 2020-07-03 – 2020-07-06 (×4): 20 mg via ORAL
  Filled 2020-07-03 (×4): qty 1

## 2020-07-03 MED ORDER — NILOTINIB HCL 200 MG PO CAPS
200.0000 mg | ORAL_CAPSULE | Freq: Two times a day (BID) | ORAL | Status: DC
  Administered 2020-07-04 – 2020-07-06 (×4): 200 mg via ORAL

## 2020-07-03 NOTE — ED Notes (Signed)
Pt alert, resting comfortably.  Pt reports 'feels tired,' no other complaints at this time.  CPAP in place, O2 sats WNL.  Will continue to monitor.

## 2020-07-03 NOTE — Progress Notes (Signed)
  Echocardiogram 2D Echocardiogram has been performed.  Darlina Sicilian M 07/03/2020, 1:21 PM

## 2020-07-03 NOTE — Progress Notes (Signed)
PROGRESS NOTE    Lourie Retz  SFK:[id number] DOB: 11/01/1946 DOA: 07/02/2020 PCP: Parke Poisson, MD   Brief Narrative:  74 y.o. female with medical history significant of DM2, HTN, fatty liver, HLD, peripheral neuropathy, gout, GERD, diastolic CHF, OSA on CPAP comes to the hospital for evaluation of shortness of breath.  Patient states she has been feeling progressive shortness of breath with exertion and bilateral lower extremity swelling for past several weeks. ER patient has some signs of volume overload, CTA chest negative for PE but shows cardiomegaly, pulmonary edema and cirrhosis.  Admitted for CHF exacerbation.    Assessment & Plan:   Principal Problem:   Acute respiratory failure with hypoxia (HCC) Active Problems:   Essential hypertension   OSA on CPAP   Moderate obesity   Lower extremity edema   Shortness of breath  Acute hypoxic respiratory failure nonrebreather Acute congestive heart failure with preserved ejection fraction, 60%, class IV -COVID-19-negative.  On 4 L of oxygen during my evaluation this morning. -Continue Lasix IV 40 mg twice daily, strict input and output, fluid restriction, daily weight -Wean off oxygen as deemed appropriate -External female catheter for accurate input and output -Echocardiogram-pending -Supportive care, supplemental oxygen.  Monitor electrolytes and replete. -Out of bed to chair  Essential hypertension -On Norvasc, Lasix IV, losartan, Bystolic.  If necessary we can hold off on some antihypertensives while getting diuresis.  Obstructive sleep apnea Morbid obesity with BMI greater than 40 -Bedtime CPAP  Diabetes mellitus type 2 -Insulin sliding scale and Accu-Chek -A1c-6.9 -Glipizide 5 mg daily  Hyperlipidemia -Daily Lipitor  Peripheral neuropathy -On Cymbalta and gabapentin     DVT prophylaxis: Lovenox Code Status: Full code Family Communication: Spoke with husband over the phone this morning  Status  is: Inpatient  Remains inpatient appropriate because:IV treatments appropriate due to intensity of illness or inability to take PO   Dispo: The patient is from: Home              Anticipated d/c is to: Home              Anticipated d/c date is: 2 days              Patient currently is not medically stable to d/c.  Patient is still hypoxic requiring 4 L nasal cannula, not on home oxygen.  Continue hospital stay for IV diuresis      Body mass index is 42.63 kg/m.      Subjective: Patient is still having some orthopneic symptoms but improved compared to yesterday.  Wean down to 4 L nasal cannula.  Review of Systems Otherwise negative except as per HPI, including: General: Denies fever, chills, night sweats or unintended weight loss. Resp: Denies hemoptysis Cardiac: Denies chest pain, palpitations, orthopnea, paroxysmal nocturnal dyspnea. GI: Denies abdominal pain, nausea, vomiting, diarrhea or constipation GU: Denies dysuria, frequency, hesitancy or incontinence MS: Denies muscle aches, joint pain or swelling Neuro: Denies headache, neurologic deficits (focal weakness, numbness, tingling), abnormal gait Psych: Denies anxiety, depression, SI/HI/AVH Skin: Denies new rashes or lesions ID: Denies sick contacts, exotic exposures, travel  Examination:  General exam: Appears calm and comfortable, 4 L nasal cannula Respiratory system: Bibasilar crackles midway up the lung fields Cardiovascular system: S1 & S2 heard, RRR. No JVD, murmurs, rubs, gallops or clicks.  One-2+ bilateral lower extremity pitting edema Gastrointestinal system: Abdomen is nondistended, soft and nontender. No organomegaly or masses felt. Normal bowel sounds heard. Central nervous system: Alert and oriented. No focal  neurological deficits. Extremities: Symmetric 5 x 5 power. Skin: No rashes, lesions or ulcers Psychiatry: Judgement and insight appear normal. Mood & affect appropriate.     Objective: Vitals:    07/03/20 0900 07/03/20 0940 07/03/20 0946 07/03/20 0952  BP: (!) 169/71 (!) 169/71    Pulse: 80  74 73  Resp: (!) 26  18 13   Temp:      TempSrc:      SpO2: 99%  100% 92%  Weight:      Height:       No intake or output data in the 24 hours ending 07/03/20 1007 Filed Weights   07/02/20 1233 07/03/20 0837  Weight: 98.9 kg 99 kg     Data Reviewed:   CBC: Recent Labs  Lab 07/02/20 1232 07/03/20 0522  WBC 13.0* 12.5*  HGB 11.5* 11.2*  HCT 36.7 35.5*  MCV 86.4 87.7  PLT 269 675   Basic Metabolic Panel: Recent Labs  Lab 07/02/20 1232 07/03/20 0522  NA 143 140  K 3.7 3.9  CL 107 105  CO2 27 25  GLUCOSE 122* 119*  BUN 15 14  CREATININE 0.77 0.81  CALCIUM 9.2 8.8*  MG  --  1.9   GFR: Estimated Creatinine Clearance: 66.3 mL/min (by C-G formula based on SCr of 0.81 mg/dL). Liver Function Tests: Recent Labs  Lab 07/02/20 1232  AST 37  ALT 31  ALKPHOS 112  BILITOT 2.0*  PROT 8.0  ALBUMIN 4.2   No results for input(s): LIPASE, AMYLASE in the last 168 hours. No results for input(s): AMMONIA in the last 168 hours. Coagulation Profile: No results for input(s): INR, PROTIME in the last 168 hours. Cardiac Enzymes: No results for input(s): CKTOTAL, CKMB, CKMBINDEX, TROPONINI in the last 168 hours. BNP (last 3 results) No results for input(s): PROBNP in the last 8760 hours. HbA1C: Recent Labs    07/03/20 0522  HGBA1C 6.9*   CBG: Recent Labs  Lab 07/02/20 1743 07/02/20 1857 07/02/20 2236 07/03/20 0836  GLUCAP 88 103* 111* 114*   Lipid Profile: No results for input(s): CHOL, HDL, LDLCALC, TRIG, CHOLHDL, LDLDIRECT in the last 72 hours. Thyroid Function Tests: No results for input(s): TSH, T4TOTAL, FREET4, T3FREE, THYROIDAB in the last 72 hours. Anemia Panel: No results for input(s): VITAMINB12, FOLATE, FERRITIN, TIBC, IRON, RETICCTPCT in the last 72 hours. Sepsis Labs: Recent Labs  Lab 07/02/20 1232  PROCALCITON <0.10    Recent Results (from the  past 240 hour(s))  SARS Coronavirus 2 by RT PCR (hospital order, performed in [facility] hospital lab) Nasopharyngeal Nasopharyngeal Swab     Status: None   Collection Time: 06/23/20  1:35 PM   Specimen: Nasopharyngeal Swab  Result Value Ref Range Status   SARS Coronavirus 2 NEGATIVE NEGATIVE Final    Comment: (NOTE) SARS-CoV-2 target nucleic acids are NOT DETECTED.  The SARS-CoV-2 RNA is generally detectable in upper and lower respiratory specimens during the acute phase of infection. The lowest concentration of SARS-CoV-2 viral copies this assay can detect is 250 copies / mL. A negative result does not preclude SARS-CoV-2 infection and should not be used as the sole basis for treatment or other patient management decisions.  A negative result may occur with improper specimen collection / handling, submission of specimen other than nasopharyngeal swab, presence of viral mutation(s) within the areas targeted by this assay, and inadequate number of viral copies (<250 copies / mL). A negative result must be combined with clinical observations, patient history, and epidemiological information.  Fact Sheet for Patients:   StrictlyIdeas.no  Fact Sheet for Healthcare Providers: BankingDealers.co.za  This test is not yet approved or  cleared by the [place] FDA and has been authorized for detection and/or diagnosis of SARS-CoV-2 by FDA under an Emergency Use Authorization (EUA).  This EUA will remain in effect (meaning this test can be used) for the duration of the COVID-19 declaration under Section 564(b)(1) of the Act, 21 U.S.C. section 360bbb-3(b)(1), unless the authorization is terminated or revoked sooner.  Performed at [facility], [address] [place], [place] [address]   SARS Coronavirus 2 by RT PCR (hospital order, performed in [facility] hospital lab) Nasopharyngeal Nasopharyngeal Swab     Status: None    Collection Time: 07/02/20 12:00 PM   Specimen: Nasopharyngeal Swab  Result Value Ref Range Status   SARS Coronavirus 2 NEGATIVE NEGATIVE Final    Comment: (NOTE) SARS-CoV-2 target nucleic acids are NOT DETECTED.  The SARS-CoV-2 RNA is generally detectable in upper and lower respiratory specimens during the acute phase of infection. The lowest concentration of SARS-CoV-2 viral copies this assay can detect is 250 copies / mL. A negative result does not preclude SARS-CoV-2 infection and should not be used as the sole basis for treatment or other patient management decisions.  A negative result may occur with improper specimen collection / handling, submission of specimen other than nasopharyngeal swab, presence of viral mutation(s) within the areas targeted by this assay, and inadequate number of viral copies (<250 copies / mL). A negative result must be combined with clinical observations, patient history, and epidemiological information.  Fact Sheet for Patients:   StrictlyIdeas.no  Fact Sheet for Healthcare Providers: BankingDealers.co.za  This test is not yet approved or  cleared by the [place] FDA and has been authorized for detection and/or diagnosis of SARS-CoV-2 by FDA under an Emergency Use Authorization (EUA).  This EUA will remain in effect (meaning this test can be used) for the duration of the COVID-19 declaration under Section 564(b)(1) of the Act, 21 U.S.C. section 360bbb-3(b)(1), unless the authorization is terminated or revoked sooner.  Performed at [facility], [place] [address], [place], [place] [address]   MRSA PCR Screening     Status: None   Collection Time: 07/03/20  8:37 AM   Specimen: Nasal Mucosa; Nasopharyngeal  Result Value Ref Range Status   MRSA by PCR NEGATIVE NEGATIVE Final    Comment:        The GeneXpert MRSA Assay (FDA approved for NASAL specimens only), is one component of  a comprehensive MRSA colonization surveillance program. It is not intended to diagnose MRSA infection nor to guide or monitor treatment for MRSA infections. Performed at [facility], [place] [address] [place], [place] [address]          Radiology Studies: CT Angio Chest PE W and/or Wo Contrast  Result Date: 07/02/2020 CLINICAL DATA:  74 year old female with chest pain and shortness of breath. EXAM: CT ANGIOGRAPHY CHEST WITH CONTRAST TECHNIQUE: Multidetector CT imaging of the chest was performed using the standard protocol during bolus administration of intravenous contrast. Multiplanar CT image reconstructions and MIPs were obtained to evaluate the vascular anatomy. CONTRAST:  115mL OMNIPAQUE IOHEXOL 350 MG/ML SOLN COMPARISON:  Chest CT dated 06/23/2020. FINDINGS: Evaluation of this exam is limited due to respiratory motion artifact. Cardiovascular: There is mild cardiomegaly. No pericardial effusion. Coronary vascular calcification of the LAD. There is mild atherosclerotic calcification of the thoracic aorta.  No aneurysmal dilatation or dissection. Evaluation of the pulmonary arteries is limited due to respiratory motion artifact. No pulmonary artery embolus identified. Mediastinum/Nodes: No hilar adenopathy. Mildly enlarged mediastinal lymph nodes measuring approximately 13 mm in short axis anterior to the carina. Overall interval increase in the size of the mediastinal lymph nodes compared to the CT of 06/23/2020. the esophagus is grossly unremarkable. No mediastinal fluid collection Lungs/Pleura: Small bilateral pleural effusions. There are bilateral lower lobe partial consolidative changes which may represent atelectasis or pneumonia. There is diffuse interstitial and interlobular septal prominence most likely edema. There is no pneumothorax. The central airways are patent. Upper Abdomen: Cirrhosis. Cholecystectomy. Linear calcification of the splenic capsule. Partially  visualized small ascites. Musculoskeletal: Degenerative changes of the spine. No acute osseous pathology. Review of the MIP images confirms the above findings. IMPRESSION: 1. No CT evidence of pulmonary embolism. 2. Mild cardiomegaly with findings of CHF and small bilateral pleural effusions. 3. Bilateral lower lobe atelectasis or pneumonia. Clinical correlation and follow-up to resolution recommended. 4. Mildly enlarged mediastinal lymph nodes, likely reactive. 5. Cirrhosis with small ascites. 6. Aortic Atherosclerosis (ICD10-I70.0). Electronically Signed   By: Anner Crete M.D.   On: 07/02/2020 16:32   DG Chest Port 1 View  Result Date: 07/02/2020 CLINICAL DATA:  Shortness of breath EXAM: PORTABLE CHEST 1 VIEW COMPARISON:  June 23, 2020 chest radiograph and CT angiogram chest FINDINGS: Lungs are clear. Heart is borderline enlarged with pulmonary vascularity normal. No adenopathy. There is aortic atherosclerosis. No bone lesions. IMPRESSION: Heart borderline enlarged.  Lungs clear. Aortic Atherosclerosis (ICD10-I70.0). Electronically Signed   By: Lowella Grip III M.D.   On: 07/02/2020 12:35        Scheduled Meds: . amLODipine  10 mg Oral Daily  . atorvastatin  40 mg Oral Daily  . Chlorhexidine Gluconate Cloth  6 each Topical Daily  . DULoxetine  60 mg Oral Daily  . enoxaparin (LOVENOX) injection  40 mg Subcutaneous Q24H  . furosemide  40 mg Intravenous BID  . gabapentin  1,600 mg Oral BID  . glipiZIDE  5 mg Oral QAC breakfast  . insulin aspart  0-15 Units Subcutaneous TID WC  . insulin aspart  0-5 Units Subcutaneous QHS  . insulin aspart  2 Units Subcutaneous TID WC  . losartan  50 mg Oral Daily  . mouth rinse  15 mL Mouth Rinse BID  . nebivolol  5 mg Oral Daily  . rOPINIRole  3 mg Oral QHS  . sodium chloride flush  3 mL Intravenous Q12H   Continuous Infusions: . sodium chloride       LOS: 1 day   Time spent= 35 mins    Kazia Grisanti Arsenio Loader, MD [facility]  If  7PM-7AM, please contact night-coverage  07/03/2020, 10:07 AM

## 2020-07-03 NOTE — ED Notes (Signed)
Patient is resting comfortably. 

## 2020-07-03 NOTE — Progress Notes (Signed)
Pt would like to take a nap at this time; this RN will get pt up to chair around lunchtime and instruct on proper IS use.

## 2020-07-03 NOTE — ED Notes (Signed)
Patient appears to be asleep, respiration e/u, good color, snoring softly.

## 2020-07-03 NOTE — Plan of Care (Signed)
This RN will continue to monitor patient's progression of care plan.  

## 2020-07-03 NOTE — Plan of Care (Signed)

## 2020-07-04 LAB — CBC
HCT: 35 % — ABNORMAL LOW (ref 36.0–46.0)
Hemoglobin: 10.9 g/dL — ABNORMAL LOW (ref 12.0–15.0)
MCH: 27.5 pg (ref 26.0–34.0)
MCHC: 31.1 g/dL (ref 30.0–36.0)
MCV: 88.4 fL (ref 80.0–100.0)
Platelets: 266 10*3/uL (ref 150–400)
RBC: 3.96 MIL/uL (ref 3.87–5.11)
RDW: 15.1 % (ref 11.5–15.5)
WBC: 12.1 10*3/uL — ABNORMAL HIGH (ref 4.0–10.5)
nRBC: 0 % (ref 0.0–0.2)

## 2020-07-04 LAB — GLUCOSE, CAPILLARY
Glucose-Capillary: 107 mg/dL — ABNORMAL HIGH (ref 70–99)
Glucose-Capillary: 58 mg/dL — ABNORMAL LOW (ref 70–99)
Glucose-Capillary: 34 mg/dL — CL (ref 70–99)
Glucose-Capillary: 94 mg/dL (ref 70–99)
Glucose-Capillary: 108 mg/dL — ABNORMAL HIGH (ref 70–99)
Glucose-Capillary: 114 mg/dL — ABNORMAL HIGH (ref 70–99)

## 2020-07-04 LAB — BASIC METABOLIC PANEL
Anion gap: 9 (ref 5–15)
BUN: 17 mg/dL (ref 8–23)
CO2: 26 mmol/L (ref 22–32)
Calcium: 8.7 mg/dL — ABNORMAL LOW (ref 8.9–10.3)
Chloride: 104 mmol/L (ref 98–111)
Creatinine, Ser: 0.93 mg/dL (ref 0.44–1.00)
GFR calc Af Amer: >60 mL/min (ref >60)
GFR calc non Af Amer: >60 mL/min (ref >60)
Glucose, Bld: 103 mg/dL — ABNORMAL HIGH (ref 70–99)
Potassium: 3.6 mmol/L (ref 3.5–5.1)
Sodium: 139 mmol/L (ref 135–145)

## 2020-07-04 LAB — MAGNESIUM: Magnesium: 1.9 mg/dL (ref 1.7–2.4)

## 2020-07-04 MED ORDER — KATE FARMS STANDARD 1.4 PO LIQD
325.0000 mL | Freq: Every day | ORAL | Status: DC
  Filled 2020-07-04 (×3): qty 325

## 2020-07-04 MED ORDER — FUROSEMIDE 10 MG/ML IJ SOLN
40.0000 mg | Freq: Three times a day (TID) | INTRAMUSCULAR | Status: DC
  Administered 2020-07-04 – 2020-07-05 (×4): 40 mg via INTRAVENOUS
  Filled 2020-07-04 (×4): qty 4

## 2020-07-04 MED ORDER — ADULT MULTIVITAMIN W/MINERALS CH
1.0000 | ORAL_TABLET | Freq: Every day | ORAL | Status: DC
  Administered 2020-07-04 – 2020-07-06 (×3): 1 via ORAL
  Filled 2020-07-04 (×3): qty 1

## 2020-07-04 MED ORDER — POTASSIUM CHLORIDE CRYS ER 20 MEQ PO TBCR
40.0000 meq | EXTENDED_RELEASE_TABLET | Freq: Once | ORAL | Status: AC
  Administered 2020-07-04: 40 meq via ORAL
  Filled 2020-07-04: qty 2

## 2020-07-04 MED ORDER — PROSOURCE PLUS PO LIQD
30.0000 mL | Freq: Two times a day (BID) | ORAL | Status: DC
  Administered 2020-07-05 – 2020-07-06 (×3): 30 mL via ORAL
  Filled 2020-07-04 (×3): qty 30

## 2020-07-04 NOTE — Progress Notes (Signed)
PROGRESS NOTE    Wanda Hanson  EVO:[id number] DOB: 26-Apr-1946 DOA: 07/02/2020 PCP: Parke Poisson, MD   Brief Narrative:  74 y.o. female with medical history significant of DM2, HTN, fatty liver, HLD, peripheral neuropathy, gout, GERD, diastolic CHF, OSA on CPAP comes to the hospital for evaluation of shortness of breath.  Patient states she has been feeling progressive shortness of breath with exertion and bilateral lower extremity swelling for past several weeks. ER patient has some signs of volume overload, CTA chest negative for PE but shows cardiomegaly, pulmonary edema and cirrhosis.  Admitted for CHF exacerbation.  Her symptoms improved with IV diuretics, echocardiogram showed EF of 60% with grade 2 diastolic dysfunction.   Assessment & Plan:   Principal Problem:   Acute respiratory failure with hypoxia (HCC) Active Problems:   Essential hypertension   OSA on CPAP   Moderate obesity   Lower extremity edema   Shortness of breath  Acute hypoxic respiratory failure nonrebreather Acute congestive heart failure with preserved ejection fraction, 60%, class III -COVID-19-negative.  I was able to wean down her oxygen from 8 L high flow nasal cannula to 4 L nasal cannula corroborates -lasix 40 mg IV, increase it to every 8 hours, strict input and output, fluid restriction, daily weight -Wean off oxygen as deemed appropriate -External female catheter for accurate input and output -Echocardiogram-EF of 29%, grade 2 diastolic dysfunction. -Supportive care, supplemental oxygen.  Monitor electrolytes and replete. -Out of bed to chair   Essential hypertension -On Norvasc, Lasix IV, losartan, Bystolic.  If necessary we can hold off on some antihypertensives while getting diuresis.   Obstructive sleep apnea Morbid obesity with BMI greater than 40 -Bedtime CPAP   Diabetes mellitus type 2 -Insulin sliding scale and Accu-Chek -A1c-6.9 -Glipizide 5 mg daily    Hyperlipidemia -Daily Lipitor   Peripheral neuropathy -On Cymbalta and gabapentin     DVT prophylaxis: Lovenox Code Status: Full code Family Communication: Dominica Severin Updated.   Status is: Inpatient  Remains inpatient appropriate because:IV treatments appropriate due to intensity of illness or inability to take PO   Dispo: The patient is from: Home              Anticipated d/c is to: Home              Anticipated d/c date is: 2 days              Patient currently is not medically stable to d/c.  Patient is still hypoxic requiring 3-4 L nasal cannula, not on home oxygen.  Continue hospital stay for IV diuresis. Transfer to Tele if Supplemental O2 remains <6L [place] until later this afternoon.       Body mass index is 42.63 kg/m.      Subjective: When I walked in the room patient was on 8 L nasal cannula saturating 97%.  I was able to wean down her nasal cannula to 4 L.  She was still saturating greater than 97% after using incentive spirometer couple of times.  She did not have any other new complaints and tells me she feels slightly better.  Review of Systems Otherwise negative except as per HPI, including: General = no fevers, chills, dizziness,  fatigue HEENT/EYES = negative for loss of vision, double vision, blurred vision,  sore throa Cardiovascular= negative for chest pain, palpitation Respiratory/lungs= negative for  wheezing; hemoptysis,  Gastrointestinal= negative for nausea, vomiting, abdominal pain Genitourinary= negative for Dysuria MSK = Negative for arthralgia, myalgias Neurology= Negative  for headache, numbness, tingling  Psychiatry= Negative for suicidal and homocidal ideation Skin= Negative for Rash   Examination:  Constitutional: Not in acute distress, 4L [place] Respiratory: bibasilar crackles.  Cardiovascular: Normal sinus rhythm, no rubs Abdomen: Nontender nondistended good bowel sounds Musculoskeletal: 1-2+ [place] edema noted Skin: No rashes  seen Neurologic: CN 2-12 grossly intact.  And nonfocal Psychiatric: Normal judgment and insight. Alert and oriented x 3. Normal mood.     Objective: Vitals:   07/04/20 0832 07/04/20 0900 07/04/20 1000 07/04/20 1058  BP:  (!) 149/65 (!) 138/49   Pulse: 73 76 73 66  Resp: (!) 21 16 13 18   Temp:      TempSrc:      SpO2: 97% 94% 93% 93%  Weight:      Height:        Intake/Output Summary (Last 24 hours) at 07/04/2020 1108 Last data filed at 07/04/2020 1000 Gross per 24 hour  Intake 780 ml  Output 1475 ml  Net -695 ml   Filed Weights   07/02/20 1233 07/03/20 0837  Weight: 98.9 kg 99 kg     Data Reviewed:   CBC: Recent Labs  Lab 07/02/20 1232 07/03/20 0522 07/04/20 0204  WBC 13.0* 12.5* 12.1*  HGB 11.5* 11.2* 10.9*  HCT 36.7 35.5* 35.0*  MCV 86.4 87.7 88.4  PLT 269 260 629   Basic Metabolic Panel: Recent Labs  Lab 07/02/20 1232 07/03/20 0522 07/04/20 0204  NA 143 140 139  K 3.7 3.9 3.6  CL 107 105 104  CO2 27 25 26   GLUCOSE 122* 119* 103*  BUN 15 14 17   CREATININE 0.77 0.81 0.93  CALCIUM 9.2 8.8* 8.7*  MG  --  1.9 1.9   GFR: Estimated Creatinine Clearance: 57.7 mL/min (by C-G formula based on SCr of 0.93 mg/dL). Liver Function Tests: Recent Labs  Lab 07/02/20 1232  AST 37  ALT 31  ALKPHOS 112  BILITOT 2.0*  PROT 8.0  ALBUMIN 4.2   No results for input(s): LIPASE, AMYLASE in the last 168 hours. No results for input(s): AMMONIA in the last 168 hours. Coagulation Profile: No results for input(s): INR, PROTIME in the last 168 hours. Cardiac Enzymes: No results for input(s): CKTOTAL, CKMB, CKMBINDEX, TROPONINI in the last 168 hours. BNP (last 3 results) No results for input(s): PROBNP in the last 8760 hours. HbA1C: Recent Labs    07/03/20 0522  HGBA1C 6.9*   CBG: Recent Labs  Lab 07/03/20 0836 07/03/20 1213 07/03/20 1643 07/03/20 2118 07/04/20 0825  GLUCAP 114* 73 73 80 107*   Lipid Profile: No results for input(s): CHOL, HDL,  LDLCALC, TRIG, CHOLHDL, LDLDIRECT in the last 72 hours. Thyroid Function Tests: No results for input(s): TSH, T4TOTAL, FREET4, T3FREE, THYROIDAB in the last 72 hours. Anemia Panel: No results for input(s): VITAMINB12, FOLATE, FERRITIN, TIBC, IRON, RETICCTPCT in the last 72 hours. Sepsis Labs: Recent Labs  Lab 07/02/20 1232  PROCALCITON <0.10    Recent Results (from the past 240 hour(s))  SARS Coronavirus 2 by RT PCR (hospital order, performed in [facility] hospital lab) Nasopharyngeal Nasopharyngeal Swab     Status: None   Collection Time: 07/02/20 12:00 PM   Specimen: Nasopharyngeal Swab  Result Value Ref Range Status   SARS Coronavirus 2 NEGATIVE NEGATIVE Final    Comment: (NOTE) SARS-CoV-2 target nucleic acids are NOT DETECTED.  The SARS-CoV-2 RNA is generally detectable in upper and lower respiratory specimens during the acute phase of infection. The lowest concentration of SARS-CoV-2  viral copies this assay can detect is 250 copies / mL. A negative result does not preclude SARS-CoV-2 infection and should not be used as the sole basis for treatment or other patient management decisions.  A negative result may occur with improper specimen collection / handling, submission of specimen other than nasopharyngeal swab, presence of viral mutation(s) within the areas targeted by this assay, and inadequate number of viral copies (<250 copies / mL). A negative result must be combined with clinical observations, patient history, and epidemiological information.  Fact Sheet for Patients:   StrictlyIdeas.no  Fact Sheet for Healthcare Providers: BankingDealers.co.za  This test is not yet approved or  cleared by the [place] FDA and has been authorized for detection and/or diagnosis of SARS-CoV-2 by FDA under an Emergency Use Authorization (EUA).  This EUA will remain in effect (meaning this test can be used) for the duration of  the COVID-19 declaration under Section 564(b)(1) of the Act, 21 U.S.C. section 360bbb-3(b)(1), unless the authorization is terminated or revoked sooner.  Performed at [facility], [place] [address] [place], [place] [address]   MRSA PCR Screening     Status: None   Collection Time: 07/03/20  8:37 AM   Specimen: Nasal Mucosa; Nasopharyngeal  Result Value Ref Range Status   MRSA by PCR NEGATIVE NEGATIVE Final    Comment:        The GeneXpert MRSA Assay (FDA approved for NASAL specimens only), is one component of a comprehensive MRSA colonization surveillance program. It is not intended to diagnose MRSA infection nor to guide or monitor treatment for MRSA infections. Performed at [facility], [place] [address], [place], [place] [address]          Radiology Studies: CT Angio Chest PE W and/or Wo Contrast  Result Date: 07/02/2020 CLINICAL DATA:  74 year old female with chest pain and shortness of breath. EXAM: CT ANGIOGRAPHY CHEST WITH CONTRAST TECHNIQUE: Multidetector CT imaging of the chest was performed using the standard protocol during bolus administration of intravenous contrast. Multiplanar CT image reconstructions and MIPs were obtained to evaluate the vascular anatomy. CONTRAST:  169mL OMNIPAQUE IOHEXOL 350 MG/ML SOLN COMPARISON:  Chest CT dated 06/23/2020. FINDINGS: Evaluation of this exam is limited due to respiratory motion artifact. Cardiovascular: There is mild cardiomegaly. No pericardial effusion. Coronary vascular calcification of the LAD. There is mild atherosclerotic calcification of the thoracic aorta. No aneurysmal dilatation or dissection. Evaluation of the pulmonary arteries is limited due to respiratory motion artifact. No pulmonary artery embolus identified. Mediastinum/Nodes: No hilar adenopathy. Mildly enlarged mediastinal lymph nodes measuring approximately 13 mm in short axis anterior to the carina. Overall interval increase  in the size of the mediastinal lymph nodes compared to the CT of 06/23/2020. the esophagus is grossly unremarkable. No mediastinal fluid collection Lungs/Pleura: Small bilateral pleural effusions. There are bilateral lower lobe partial consolidative changes which may represent atelectasis or pneumonia. There is diffuse interstitial and interlobular septal prominence most likely edema. There is no pneumothorax. The central airways are patent. Upper Abdomen: Cirrhosis. Cholecystectomy. Linear calcification of the splenic capsule. Partially visualized small ascites. Musculoskeletal: Degenerative changes of the spine. No acute osseous pathology. Review of the MIP images confirms the above findings. IMPRESSION: 1. No CT evidence of pulmonary embolism. 2. Mild cardiomegaly with findings of CHF and small bilateral pleural effusions. 3. Bilateral lower lobe atelectasis or pneumonia. Clinical correlation and follow-up to resolution recommended. 4. Mildly enlarged mediastinal lymph nodes, likely reactive. 5. Cirrhosis with small ascites.  6. Aortic Atherosclerosis (ICD10-I70.0). Electronically Signed   By: Anner Crete M.D.   On: 07/02/2020 16:32   DG Chest Port 1 View  Result Date: 07/02/2020 CLINICAL DATA:  Shortness of breath EXAM: PORTABLE CHEST 1 VIEW COMPARISON:  June 23, 2020 chest radiograph and CT angiogram chest FINDINGS: Lungs are clear. Heart is borderline enlarged with pulmonary vascularity normal. No adenopathy. There is aortic atherosclerosis. No bone lesions. IMPRESSION: Heart borderline enlarged.  Lungs clear. Aortic Atherosclerosis (ICD10-I70.0). Electronically Signed   By: Lowella Grip III M.D.   On: 07/02/2020 12:35   ECHOCARDIOGRAM COMPLETE  Result Date: 07/03/2020    ECHOCARDIOGRAM REPORT   Patient Name:   SHUNTA MCLAURIN Date of Exam: 07/03/2020 Medical Rec #:  [id number]        Height:       60.0 in Accession #:    [id number]       Weight:       218.3 lb Date of Birth:  [birth year]         BSA:          1.937 m Patient Age:    52 years         BP:           146/63 mmHg Patient Gender: F                HR:           74 bpm. Exam Location:  Inpatient Procedure: 2D Echo Indications:    Cardiomyopathy-Unspecified 425.9 / I42.9  History:        Patient has prior history of Echocardiogram examinations, most                 recent 10/18/2019. Risk Factors:Sleep Apnea, Hypertension,                 Dyslipidemia and Diabetes. GERD. Fatty liver.  Sonographer:    Darlina Sicilian RDCS Referring Phys: [id number] Katielynn Horan CHIRAG [place] IMPRESSIONS  1. Left ventricular ejection fraction, by estimation, is 60 to 65%. The left ventricle has normal function. The left ventricle has no regional wall motion abnormalities. Left ventricular diastolic parameters are consistent with Grade II diastolic dysfunction (pseudonormalization). Elevated left atrial pressure.  2. Right ventricular systolic function is normal. The right ventricular size is mildly enlarged. Tricuspid regurgitation signal is inadequate for assessing PA pressure.  3. The mitral valve is normal in structure. No evidence of mitral valve regurgitation. No evidence of mitral stenosis.  4. The aortic valve is grossly normal. Aortic valve regurgitation is not visualized. No aortic stenosis is present.  5. The inferior vena cava is dilated in size with <50% respiratory variability, suggesting right atrial pressure of 15 mmHg. Comparison(s): Prior images reviewed side by side. The left ventricular systolic function is unchanged. The left ventricular diastolic function is significantly worse. Both right and left heart filling pressures are now elevated. FINDINGS  Left Ventricle: Left ventricular ejection fraction, by estimation, is 60 to 65%. The left ventricle has normal function. The left ventricle has no regional wall motion abnormalities. The left ventricular internal cavity size was normal in size. There is  no left ventricular hypertrophy. Left ventricular diastolic  parameters are consistent with Grade II diastolic dysfunction (pseudonormalization). Elevated left atrial pressure. Right Ventricle: The right ventricular size is mildly enlarged. No increase in right ventricular wall thickness. Right ventricular systolic function is normal. Tricuspid regurgitation signal is inadequate for assessing PA pressure. Left Atrium: Left atrial size was normal in  size. Right Atrium: Right atrial size was normal in size. Pericardium: There is no evidence of pericardial effusion. Mitral Valve: The mitral valve is normal in structure. Normal mobility of the mitral valve leaflets. No evidence of mitral valve regurgitation. No evidence of mitral valve stenosis. Tricuspid Valve: The tricuspid valve is normal in structure. Tricuspid valve regurgitation is not demonstrated. No evidence of tricuspid stenosis. Aortic Valve: The aortic valve is grossly normal. Aortic valve regurgitation is not visualized. No aortic stenosis is present. Pulmonic Valve: The pulmonic valve was grossly normal. Pulmonic valve regurgitation is not visualized. No evidence of pulmonic stenosis. Aorta: The aortic root is normal in size and structure. Venous: The inferior vena cava is dilated in size with less than 50% respiratory variability, suggesting right atrial pressure of 15 mmHg. IAS/Shunts: The atrial septum is grossly normal.  LEFT VENTRICLE PLAX 2D LVIDd:         4.60 cm  Diastology LVIDs:         3.30 cm  LV e' lateral:   5.44 cm/s LV PW:         1.00 cm  LV E/e' lateral: 24.4 LV IVS:        1.10 cm  LV e' medial:    5.33 cm/s LVOT diam:     1.80 cm  LV E/e' medial:  25.0 LV SV:         84 LV SV Index:   43 LVOT Area:     2.54 cm  RIGHT VENTRICLE RV S prime:     15.90 cm/s TAPSE (M-mode): 2.8 cm LEFT ATRIUM             Index       RIGHT ATRIUM           Index LA diam:        3.20 cm 1.65 cm/m  RA Area:     17.10 cm LA Vol (A2C):   43.2 ml 22.30 ml/m RA Volume:   44.90 ml  23.18 ml/m LA Vol (A4C):   54.9 ml  28.34 ml/m LA Biplane Vol: 50.6 ml 26.12 ml/m  AORTIC VALVE LVOT Vmax:   150.00 cm/s LVOT Vmean:  104.000 cm/s LVOT VTI:    0.330 m  AORTA Ao Root diam: 3.30 cm MITRAL VALVE MV Area (PHT): 4.49 cm     SHUNTS MV Decel Time: 169 msec     Systemic VTI:  0.33 m MV E velocity: 133.00 cm/s  Systemic Diam: 1.80 cm MV A velocity: 115.00 cm/s MV E/A ratio:  1.16 Mihai Croitoru MD Electronically signed by Sanda Klein MD Signature Date/Time: 07/03/2020/1:26:51 PM    Final          Scheduled Meds:  amLODipine  10 mg Oral Daily   atorvastatin  40 mg Oral Daily   Chlorhexidine Gluconate Cloth  6 each Topical Daily   DULoxetine  60 mg Oral Daily   enoxaparin (LOVENOX) injection  40 mg Subcutaneous Q24H   famotidine  20 mg Oral Daily   furosemide  40 mg Intravenous Q8H   gabapentin  1,600 mg Oral BID   glipiZIDE  5 mg Oral QAC breakfast   insulin aspart  0-15 Units Subcutaneous TID WC   insulin aspart  0-5 Units Subcutaneous QHS   insulin aspart  2 Units Subcutaneous TID WC   losartan  50 mg Oral Daily   mouth rinse  15 mL Mouth Rinse BID   nebivolol  5 mg Oral Daily   nilotinib  200 mg Oral Q12H   rOPINIRole  3 mg Oral QHS   sodium chloride flush  3 mL Intravenous Q12H   Continuous Infusions:  sodium chloride       LOS: 2 days   Time spent= 35 mins    Tukker Byrns Arsenio Loader, MD [facility]  If 7PM-7AM, please contact night-coverage  07/04/2020, 11:08 AM

## 2020-07-04 NOTE — Progress Notes (Signed)
PT Cancellation Note  Patient Details Name: Kenyanna Grzesiak MRN: [id number] DOB: March 01, 1946   Cancelled Treatment:    Reason Eval/Treat Not Completed: Fatigue and recent  Low CBG., limiting ability to participate. Patient was in recliner in AM. States that she will sit on bed edge when dinner arrives. Will check back tomorrow.   Claretha Cooper 07/04/2020, 4:52 PM [place] Pager [phone] Office [phone]

## 2020-07-04 NOTE — TOC Initial Note (Signed)
Transition of Care [facility]) - Initial/Assessment Note    Patient Details  Name: Wanda Hanson MRN: [id number] Date of Birth: 11-19-1946  Transition of [facility]) CM/SW Contact:    Leeroy Cha, RN Phone Number: 07/04/2020, 8:07 AM  Clinical Narrative:                 chf exacerbation in patient with hx of copd and diabetes.  Is not on o2 at home.  o2 at 8l/min, desats. To 87%. Following for toc needs and progress  Expected Discharge Plan: [place] Barriers to Discharge: Continued Medical Work up   Patient Goals and CMS Choice Patient states their goals for this hospitalization and ongoing recovery are:: to go back home CMS Medicare.gov Compare Post Acute Care list provided to:: Patient    Expected Discharge Plan and Services Expected Discharge Plan: [place]   Discharge Planning Services: CM Consult   Living arrangements for the past 2 months: Single Family Home                                      Prior Living Arrangements/Services Living arrangements for the past 2 months: Single Family Home Lives with:: Spouse Patient language and need for interpreter reviewed:: Yes Do you feel safe going back to the place where you live?: Yes      Need for Family Participation in Patient Care: Yes (Comment) Care giver support system in place?: Yes (comment)   Criminal Activity/Legal Involvement Pertinent to Current Situation/Hospitalization: No - Comment as needed  Activities of Daily Living Home Assistive Devices/Equipment: Eyeglasses, Shower chair with back, Environmental consultant (specify type) (walker with arms) ADL Screening (condition at time of admission) Patient's cognitive ability adequate to safely complete daily activities?: Yes Is the patient deaf or have difficulty hearing?: No Does the patient have difficulty seeing, even when wearing glasses/contacts?: Yes Does the patient have difficulty concentrating, remembering, or making  decisions?: Yes (has trouble concentrating) Patient able to express need for assistance with ADLs?: Yes Does the patient have difficulty dressing or bathing?: Yes Independently performs ADLs?: No Communication: Independent Dressing (OT): Needs assistance Is this a change from baseline?: Change from baseline, expected to last <3days Grooming: Needs assistance Is this a change from baseline?: Change from baseline, expected to last <3 days Feeding: Independent Bathing: Needs assistance Is this a change from baseline?: Change from baseline, expected to last <3 days Toileting: Needs assistance Is this a change from baseline?: Change from baseline, expected to last <3 days In/Out Bed: Needs assistance Is this a change from baseline?: Change from baseline, expected to last <3 days Walks in Home: Needs assistance Is this a change from baseline?: Change from baseline, expected to last <3 days Does the patient have difficulty walking or climbing stairs?: Yes Weakness of Legs: Both Weakness of Arms/Hands: Both  Permission Sought/Granted                  Emotional Assessment Appearance:: Appears stated age Attitude/Demeanor/Rapport: Engaged Affect (typically observed): Calm Orientation: : Oriented to Place, Oriented to Self, Oriented to  Time, Oriented to Situation Alcohol / Substance Use: Not Applicable Psych Involvement: No (comment)  Admission diagnosis:  Acute respiratory distress [R06.03] Acute respiratory failure with hypoxia (HCC) [J96.01] Congestive heart failure, unspecified HF chronicity, unspecified heart failure type ([place]) [I50.9] Patient Active Problem List   Diagnosis Date Noted  . Acute  respiratory failure with hypoxia ([place]) 07/02/2020  . Coronary artery calcification seen on CT scan 11/22/2019  . Lower extremity edema 11/22/2019  . Shortness of breath 11/22/2019  . Acute bronchitis 10/28/2019  . Racing heart beat 10/28/2019  . Moderate obesity 02/26/2018  . OSA  on CPAP 12/30/2017  . Hypoxia   . Hypokalemia   . Essential hypertension   . Sepsis due to pneumonia ([place]) 11/15/2017   PCP:  Parke Poisson, MD Pharmacy:   [facility] [place], [place] [place]  [address] Phone: [phone] Fax: [phone]     Social Determinants of Health (SDOH) Interventions    Readmission Risk Interventions No flowsheet data found.

## 2020-07-04 NOTE — Progress Notes (Signed)
Hypoglycemic Event  CBG: 34  Treatment: 8 oz juice/soda  Symptoms: Shaky  Follow-up CBG: [id number] CBG Result:94  Possible Reasons for Event: Unknown   Stacey Drain

## 2020-07-04 NOTE — Plan of Care (Signed)
  Problem: Education: Goal: Knowledge of General Education information will improve Description: Including pain rating scale, medication(s)/side effects and non-pharmacologic comfort measures Outcome: Completed/Met

## 2020-07-04 NOTE — Progress Notes (Signed)
Initial Nutrition Assessment  DOCUMENTATION CODES:   Obesity unspecified  INTERVENTION:  - will order [employer] 1.4 po once/day, each supplement provides 455 kcal and 20 grams protein. - will order 30 mL Prosource Plus BID, each supplement provides 100 kcal and 15 grams of protein. - will order 1 tablet multivitamin with minerals/day.   NUTRITION DIAGNOSIS:   Inadequate oral intake related to decreased appetite as evidenced by per patient/family report.  GOAL:   Patient will meet greater than or equal to 90% of their needs  MONITOR:   PO intake, Supplement acceptance, Labs, Weight trends  REASON FOR ASSESSMENT:   Malnutrition Screening Tool  ASSESSMENT:   74 y.o. female with medical history of type 2 DM, HTN, fatty liver, HLD, peripheral neuropathy, gout, GERD, CHF, and OSA on CPAP. She presented to the ED due to progressive shortness of breath with exertion and BLE swelling for several weeks. CTA chest negative for PE but showed cardiomegaly, pulmonary edema, and cirrhosis. She was admitted for CHF exacerbation.  Patient has been eating 50-100% of small meals. She reports that appetite has been decreased for several days but she is unsure if she has lost any weight, especially due to BLE swelling.  Weight yesterday was 218 lb, weight 7/31 was 216 lb, and PTA the most recently documented weight was on 02/10/20 when she weighed 193 lb. Flow sheet documentation indicates mild edema to BLE.  Per notes: - CHF exacerbation--IV lasix ordered - hx of type 2 DM with HgbA1c: 6.9%     Labs reviewed; CBGs: 107, 58, 34, 94 mg/dl, Ca: 8.7 mg/dl. Medications reviewed; 20 mg pepcid/day, 40 mg IV lasix TID, 5 mg glucotrol/day, sliding scale novolog, 2 units novolog TID.    Diet Order:   Diet Order            Diet Heart Room service appropriate? Yes; Fluid consistency: Thin; Fluid restriction: 1500 mL Fluid  Diet effective now                 EDUCATION NEEDS:   No education  needs have been identified at this time  Skin:  Skin Assessment: Reviewed RN Assessment  Last BM:  8/9  Height:   Ht Readings from Last 1 Encounters:  07/03/20 5' (1.524 m)    Weight:   Wt Readings from Last 1 Encounters:  07/03/20 99 kg    Estimated Nutritional Needs:  Kcal:  1500-1700 kcal Protein:  70-80 grams Fluid:  >/= 1.5 L/day     Jarome Matin, MS, RD, LDN, CNSC Inpatient Clinical Dietitian RD pager # available in [facility]  After hours/weekend pager # available in [facility]

## 2020-07-05 ENCOUNTER — Inpatient Hospital Stay (HOSPITAL_COMMUNITY): Payer: Medicare Other

## 2020-07-05 LAB — CBC
HCT: 36.7 % (ref 36.0–46.0)
Hemoglobin: 11.6 g/dL — ABNORMAL LOW (ref 12.0–15.0)
MCH: 27.4 pg (ref 26.0–34.0)
MCHC: 31.6 g/dL (ref 30.0–36.0)
MCV: 86.6 fL (ref 80.0–100.0)
Platelets: 296 10*3/uL (ref 150–400)
RBC: 4.24 MIL/uL (ref 3.87–5.11)
RDW: 15 % (ref 11.5–15.5)
WBC: 10.3 10*3/uL (ref 4.0–10.5)
nRBC: 0 % (ref 0.0–0.2)

## 2020-07-05 LAB — BASIC METABOLIC PANEL
Anion gap: 12 (ref 5–15)
BUN: 16 mg/dL (ref 8–23)
CO2: 27 mmol/L (ref 22–32)
Calcium: 9.1 mg/dL (ref 8.9–10.3)
Chloride: 101 mmol/L (ref 98–111)
Creatinine, Ser: 0.67 mg/dL (ref 0.44–1.00)
GFR calc Af Amer: >60 mL/min (ref >60)
GFR calc non Af Amer: >60 mL/min (ref >60)
Glucose, Bld: 127 mg/dL — ABNORMAL HIGH (ref 70–99)
Potassium: 3.1 mmol/L — ABNORMAL LOW (ref 3.5–5.1)
Sodium: 140 mmol/L (ref 135–145)

## 2020-07-05 LAB — GLUCOSE, CAPILLARY
Glucose-Capillary: 144 mg/dL — ABNORMAL HIGH (ref 70–99)
Glucose-Capillary: 99 mg/dL (ref 70–99)
Glucose-Capillary: 99 mg/dL (ref 70–99)
Glucose-Capillary: 117 mg/dL — ABNORMAL HIGH (ref 70–99)

## 2020-07-05 LAB — MAGNESIUM: Magnesium: 1.9 mg/dL (ref 1.7–2.4)

## 2020-07-05 LAB — BRAIN NATRIURETIC PEPTIDE: B Natriuretic Peptide: 247.5 pg/mL — ABNORMAL HIGH (ref 0.0–100.0)

## 2020-07-05 MED ORDER — POTASSIUM CHLORIDE CRYS ER 20 MEQ PO TBCR
40.0000 meq | EXTENDED_RELEASE_TABLET | ORAL | Status: AC
  Administered 2020-07-05 (×2): 40 meq via ORAL
  Filled 2020-07-05 (×2): qty 2

## 2020-07-05 MED ORDER — FUROSEMIDE 10 MG/ML IJ SOLN
40.0000 mg | Freq: Every day | INTRAMUSCULAR | Status: DC
  Administered 2020-07-06: 40 mg via INTRAVENOUS
  Filled 2020-07-05: qty 4

## 2020-07-05 NOTE — Care Management Important Message (Signed)
Important Message  Patient Details IM Letter given to the Patient Name: Wanda Hanson MRN: [id number] Date of Birth: July 07, 1946   Medicare Important Message Given:  Yes     Kerin Salen 07/05/2020, 10:40 AM

## 2020-07-05 NOTE — Progress Notes (Signed)
PROGRESS NOTE    Emelda Kohlbeck  OHY:[id number] DOB: 1946/05/31 DOA: 07/02/2020 PCP: Parke Poisson, MD   Brief Narrative:  74 y.o. female with medical history significant of DM2, HTN, fatty liver, HLD, peripheral neuropathy, gout, GERD, diastolic CHF, OSA on CPAP comes to the hospital for evaluation of shortness of breath.  Patient states she has been feeling progressive shortness of breath with exertion and bilateral lower extremity swelling for past several weeks. ER patient has some signs of volume overload, CTA chest negative for PE but shows cardiomegaly, pulmonary edema and cirrhosis.  Admitted for CHF exacerbation.  Her symptoms improved with IV diuretics, echocardiogram showed EF of 60% with grade 2 diastolic dysfunction.   Assessment & Plan:   Principal Problem:   Acute respiratory failure with hypoxia (HCC) Active Problems:   Essential hypertension   OSA on CPAP   Moderate obesity   Lower extremity edema   Shortness of breath   Acute hypoxic respiratory failure nonrebreather Acute congestive heart failure with preserved ejection fraction, 60%, class III -COVID-19-negative.  Wean down to 2 L nasal cannula. -Change Lasix to 40 mg IV daily.  Closely monitor input and output. -Wean off oxygen as deemed appropriate -External female catheter for accurate input and output -Echocardiogram-EF of 94%, grade 2 diastolic dysfunction. -Supportive care, supplemental oxygen.  Monitor electrolytes and replete. -Out of bed to chair.  PT/OT today   Essential hypertension -On Norvasc, losartan and Bystolic.  Getting IV diuretics.   Obstructive sleep apnea Morbid obesity with BMI greater than 40 -Bedtime CPAP   Diabetes mellitus type 2, with hypoglycemic episode yesterday -Insulin sliding scale and Accu-Chek -A1c-6.9 -Hold off on glipizide   Hyperlipidemia -Daily Lipitor   Peripheral neuropathy -On Cymbalta and gabapentin  PT/OT-pending   DVT prophylaxis:  Lovenox Code Status: Full code Family Communication: Spoke with her husband over the phone  Status is: Inpatient  Remains inpatient appropriate because:IV treatments appropriate due to intensity of illness or inability to take PO   Dispo: The patient is from: Home              Anticipated d/c is to: Home              Anticipated d/c date is: 1 day              Patient currently is not medically stable to d/c.  Still having exertional dyspnea and 2 L nasal cannula.  Not on any home oxygen.  Hopefully will be able to wean her off the next 24 hours in the meantime get PT/OT.Marland Kitchen       Body mass index is 41.11 kg/m.      Subjective: Feels better compared to yesterday.  Down to 2 L nasal cannula.  Still getting some exertional dyspnea but improved since the time of admission.   Examination:  Constitutional: Not in acute distress, 2 L nasal cannula Respiratory: Bibasilar crackles Cardiovascular: Normal sinus rhythm, no rubs Abdomen: Nontender nondistended good bowel sounds Musculoskeletal: 1+ bilateral lower extremity pitting edema Skin: No rashes seen Neurologic: CN 2-12 grossly intact.  And nonfocal Psychiatric: Normal judgment and insight. Alert and oriented x 3. Normal mood.  Objective: Vitals:   07/04/20 2200 07/04/20 2307 07/05/20 0202 07/05/20 0623  BP: 137/73  (!) 129/59 132/63  Pulse: 72  69 75  Resp:  (!) 22 16   Temp: 98.1 F (36.7 C)  98.2 F (36.8 C) 98.3 F (36.8 C)  TempSrc: Oral  Oral Oral  SpO2: 90%  98%  Weight:    95.5 kg  Height:        Intake/Output Summary (Last 24 hours) at 07/05/2020 1135 Last data filed at 07/05/2020 0640 Gross per 24 hour  Intake 440 ml  Output 3050 ml  Net -2610 ml   Filed Weights   07/02/20 1233 07/03/20 0837 07/05/20 0623  Weight: 98.9 kg 99 kg 95.5 kg     Data Reviewed:   CBC: Recent Labs  Lab 07/02/20 1232 07/03/20 0522 07/04/20 0204 07/05/20 0530  WBC 13.0* 12.5* 12.1* 10.3  HGB 11.5* 11.2* 10.9*  11.6*  HCT 36.7 35.5* 35.0* 36.7  MCV 86.4 87.7 88.4 86.6  PLT 269 260 266 315   Basic Metabolic Panel: Recent Labs  Lab 07/02/20 1232 07/03/20 0522 07/04/20 0204 07/05/20 0530  NA 143 140 139 140  K 3.7 3.9 3.6 3.1*  CL 107 105 104 101  CO2 27 25 26 27   GLUCOSE 122* 119* 103* 127*  BUN 15 14 17 16   CREATININE 0.77 0.81 0.93 0.67  CALCIUM 9.2 8.8* 8.7* 9.1  MG  --  1.9 1.9 1.9   GFR: Estimated Creatinine Clearance: 65.7 mL/min (by C-G formula based on SCr of 0.67 mg/dL). Liver Function Tests: Recent Labs  Lab 07/02/20 1232  AST 37  ALT 31  ALKPHOS 112  BILITOT 2.0*  PROT 8.0  ALBUMIN 4.2   No results for input(s): LIPASE, AMYLASE in the last 168 hours. No results for input(s): AMMONIA in the last 168 hours. Coagulation Profile: No results for input(s): INR, PROTIME in the last 168 hours. Cardiac Enzymes: No results for input(s): CKTOTAL, CKMB, CKMBINDEX, TROPONINI in the last 168 hours. BNP (last 3 results) No results for input(s): PROBNP in the last 8760 hours. HbA1C: Recent Labs    07/03/20 0522  HGBA1C 6.9*   CBG: Recent Labs  Lab 07/04/20 1434 07/04/20 1450 07/04/20 1708 07/04/20 2107 07/05/20 0842  GLUCAP 34* 94 108* 114* 144*   Lipid Profile: No results for input(s): CHOL, HDL, LDLCALC, TRIG, CHOLHDL, LDLDIRECT in the last 72 hours. Thyroid Function Tests: No results for input(s): TSH, T4TOTAL, FREET4, T3FREE, THYROIDAB in the last 72 hours. Anemia Panel: No results for input(s): VITAMINB12, FOLATE, FERRITIN, TIBC, IRON, RETICCTPCT in the last 72 hours. Sepsis Labs: Recent Labs  Lab 07/02/20 1232  PROCALCITON <0.10    Recent Results (from the past 240 hour(s))  SARS Coronavirus 2 by RT PCR (hospital order, performed in [facility] hospital lab) Nasopharyngeal Nasopharyngeal Swab     Status: None   Collection Time: 07/02/20 12:00 PM   Specimen: Nasopharyngeal Swab  Result Value Ref Range Status   SARS Coronavirus 2 NEGATIVE NEGATIVE  Final    Comment: (NOTE) SARS-CoV-2 target nucleic acids are NOT DETECTED.  The SARS-CoV-2 RNA is generally detectable in upper and lower respiratory specimens during the acute phase of infection. The lowest concentration of SARS-CoV-2 viral copies this assay can detect is 250 copies / mL. A negative result does not preclude SARS-CoV-2 infection and should not be used as the sole basis for treatment or other patient management decisions.  A negative result may occur with improper specimen collection / handling, submission of specimen other than nasopharyngeal swab, presence of viral mutation(s) within the areas targeted by this assay, and inadequate number of viral copies (<250 copies / mL). A negative result must be combined with clinical observations, patient history, and epidemiological information.  Fact Sheet for Patients:   StrictlyIdeas.no  Fact Sheet for Healthcare Providers:  BankingDealers.co.za  This test is not yet approved or  cleared by the [place] and has been authorized for detection and/or diagnosis of SARS-CoV-2 by FDA under an Emergency Use Authorization (EUA).  This EUA will remain in effect (meaning this test can be used) for the duration of the COVID-19 declaration under Section 564(b)(1) of the Act, 21 U.S.C. section 360bbb-3(b)(1), unless the authorization is terminated or revoked sooner.  Performed at [facility], [place] [address] [place], [place] [address]   MRSA PCR Screening     Status: None   Collection Time: 07/03/20  8:37 AM   Specimen: Nasal Mucosa; Nasopharyngeal  Result Value Ref Range Status   MRSA by PCR NEGATIVE NEGATIVE Final    Comment:        The GeneXpert MRSA Assay (FDA approved for NASAL specimens only), is one component of a comprehensive MRSA colonization surveillance program. It is not intended to diagnose MRSA infection nor to guide or monitor  treatment for MRSA infections. Performed at [facility], [place] [address] [place], [place] [address]          Radiology Studies: [facility] Chest Port 1 View  Result Date: 07/05/2020 CLINICAL DATA:  Dyspnea. EXAM: PORTABLE CHEST 1 VIEW COMPARISON:  07/02/2020 FINDINGS: Lungs are adequately inflated with patchy density over the right midlung which is new. No evidence of effusion. Mild hazy prominence of the perihilar vessels. Borderline stable cardiomegaly. Remainder the exam is unchanged. IMPRESSION: 1. New patchy density over the right midlung which may be due to atelectasis or infection. 2.  Suggestion of mild vascular congestion. Electronically Signed   By: Marin Olp M.D.   On: 07/05/2020 08:00   ECHOCARDIOGRAM COMPLETE  Result Date: 07/03/2020    ECHOCARDIOGRAM REPORT   Patient Name:   DENNIS KILLILEA Date of Exam: 07/03/2020 Medical Rec #:  [id number]        Height:       60.0 in Accession #:    [id number]       Weight:       218.3 lb Date of Birth:  1946-07-18        BSA:          1.937 m Patient Age:    34 years         BP:           146/63 mmHg Patient Gender: F                HR:           74 bpm. Exam Location:  Inpatient Procedure: 2D Echo Indications:    Cardiomyopathy-Unspecified 425.9 / I42.9  History:        Patient has prior history of Echocardiogram examinations, most                 recent 10/18/2019. Risk Factors:Sleep Apnea, Hypertension,                 Dyslipidemia and Diabetes. GERD. Fatty liver.  Sonographer:    Darlina Sicilian RDCS Referring Phys: [id number] Levette Paulick CHIRAG Gery Sabedra IMPRESSIONS  1. Left ventricular ejection fraction, by estimation, is 60 to 65%. The left ventricle has normal function. The left ventricle has no regional wall motion abnormalities. Left ventricular diastolic parameters are consistent with Grade II diastolic dysfunction (pseudonormalization). Elevated left atrial pressure.  2. Right ventricular systolic function is normal. The right  ventricular size is mildly enlarged. Tricuspid regurgitation signal is inadequate for assessing PA pressure.  3. The mitral valve is normal in structure. No evidence of mitral valve regurgitation. No evidence of mitral stenosis.  4. The aortic valve is grossly normal. Aortic valve regurgitation is not visualized. No aortic stenosis is present.  5. The inferior vena cava is dilated in size with <50% respiratory variability, suggesting right atrial pressure of 15 mmHg. Comparison(s): Prior images reviewed side by side. The left ventricular systolic function is unchanged. The left ventricular diastolic function is significantly worse. Both right and left heart filling pressures are now elevated. FINDINGS  Left Ventricle: Left ventricular ejection fraction, by estimation, is 60 to 65%. The left ventricle has normal function. The left ventricle has no regional wall motion abnormalities. The left ventricular internal cavity size was normal in size. There is  no left ventricular hypertrophy. Left ventricular diastolic parameters are consistent with Grade II diastolic dysfunction (pseudonormalization). Elevated left atrial pressure. Right Ventricle: The right ventricular size is mildly enlarged. No increase in right ventricular wall thickness. Right ventricular systolic function is normal. Tricuspid regurgitation signal is inadequate for assessing PA pressure. Left Atrium: Left atrial size was normal in size. Right Atrium: Right atrial size was normal in size. Pericardium: There is no evidence of pericardial effusion. Mitral Valve: The mitral valve is normal in structure. Normal mobility of the mitral valve leaflets. No evidence of mitral valve regurgitation. No evidence of mitral valve stenosis. Tricuspid Valve: The tricuspid valve is normal in structure. Tricuspid valve regurgitation is not demonstrated. No evidence of tricuspid stenosis. Aortic Valve: The aortic valve is grossly normal. Aortic valve regurgitation is not  visualized. No aortic stenosis is present. Pulmonic Valve: The pulmonic valve was grossly normal. Pulmonic valve regurgitation is not visualized. No evidence of pulmonic stenosis. Aorta: The aortic root is normal in size and structure. Venous: The inferior vena cava is dilated in size with less than 50% respiratory variability, suggesting right atrial pressure of 15 mmHg. IAS/Shunts: The atrial septum is grossly normal.  LEFT VENTRICLE PLAX 2D LVIDd:         4.60 cm  Diastology LVIDs:         3.30 cm  LV e' lateral:   5.44 cm/s LV PW:         1.00 cm  LV E/e' lateral: 24.4 LV IVS:        1.10 cm  LV e' medial:    5.33 cm/s LVOT diam:     1.80 cm  LV E/e' medial:  25.0 LV SV:         84 LV SV Index:   43 LVOT Area:     2.54 cm  RIGHT VENTRICLE RV S prime:     15.90 cm/s TAPSE (M-mode): 2.8 cm LEFT ATRIUM             Index       RIGHT ATRIUM           Index LA diam:        3.20 cm 1.65 cm/m  RA Area:     17.10 cm LA Vol (A2C):   43.2 ml 22.30 ml/m RA Volume:   44.90 ml  23.18 ml/m LA Vol (A4C):   54.9 ml 28.34 ml/m LA Biplane Vol: 50.6 ml 26.12 ml/m  AORTIC VALVE LVOT Vmax:   150.00 cm/s LVOT Vmean:  104.000 cm/s LVOT VTI:    0.330 m  AORTA Ao Root diam: 3.30 cm MITRAL VALVE MV Area (PHT): 4.49 cm     SHUNTS MV Decel Time: 169 msec  Systemic VTI:  0.33 m MV E velocity: 133.00 cm/s  Systemic Diam: 1.80 cm MV A velocity: 115.00 cm/s MV E/A ratio:  1.16 Mihai Croitoru MD Electronically signed by Sanda Klein MD Signature Date/Time: 07/03/2020/1:26:51 PM    Final          Scheduled Meds: . (feeding supplement) PROSource Plus  30 mL Oral BID BM  . amLODipine  10 mg Oral Daily  . atorvastatin  40 mg Oral Daily  . Chlorhexidine Gluconate Cloth  6 each Topical Daily  . DULoxetine  60 mg Oral Daily  . enoxaparin (LOVENOX) injection  40 mg Subcutaneous Q24H  . famotidine  20 mg Oral Daily  . feeding supplement ([employer] STANDARD 1.4)  325 mL Oral Daily  . [START ON 07/06/2020] furosemide  40 mg  Intravenous Daily  . gabapentin  1,600 mg Oral BID  . insulin aspart  0-15 Units Subcutaneous TID WC  . insulin aspart  0-5 Units Subcutaneous QHS  . insulin aspart  2 Units Subcutaneous TID WC  . losartan  50 mg Oral Daily  . mouth rinse  15 mL Mouth Rinse BID  . multivitamin with minerals  1 tablet Oral Daily  . nebivolol  5 mg Oral Daily  . nilotinib  200 mg Oral Q12H  . potassium chloride  40 mEq Oral Q4H  . rOPINIRole  3 mg Oral QHS  . sodium chloride flush  3 mL Intravenous Q12H   Continuous Infusions: . sodium chloride       LOS: 3 days   Time spent= 35 mins    [place] Arsenio Loader, MD [facility]  If 7PM-7AM, please contact night-coverage  07/05/2020, 11:35 AM

## 2020-07-05 NOTE — Evaluation (Signed)
Physical Therapy Evaluation Patient Details Name: Wanda Hanson MRN: [id number] DOB: 18-Jan-1946 Today's Date: 07/05/2020   History of Present Illness  74 y.o. female with medical history significant of DM2, HTN, fatty liver, HLD, peripheral neuropathy, gout, GERD, diastolic CHF, OSA on CPAP comes to the hospital for evaluation of shortness of breath.  Patient states she has been feeling progressive shortness of breath with exertion and bilateral lower extremity swelling for past several weeks. ER patient has some signs of volume overload, CTA chest negative for PE but shows cardiomegaly, pulmonary edema and cirrhosis.  Admitted for CHF exacerbation.  Clinical Impression  The patient  Mobilizes well, Using RW for ambulation. Patient requiring supplemental oxygen today to maintain saturation >88%. Patient should progress to Dc home, spouse available. Pt admitted with above diagnosis.  Pt currently with functional limitations due to the deficits listed below (see PT Problem List). Pt will benefit from skilled PT to increase their independence and safety with mobility to allow discharge to the venue listed below.       Follow Up Recommendations Home health PT    Equipment Recommendations  None recommended by PT    Recommendations for Other Services       Precautions / Restrictions Precautions Precautions: Fall Precaution Comments: monitor sats and HR      Mobility  Bed Mobility Overal bed mobility: Needs Assistance Bed Mobility: Supine to Sit     Supine to sit: Supervision        Transfers Overall transfer level: Needs assistance Equipment used: Rolling walker (2 wheeled) Transfers: Sit to/from Stand Sit to Stand: Min guard         General transfer comment: cues for safety  Ambulation/Gait Ambulation/Gait assistance: Min guard Gait Distance (Feet): 20 Feet (x 2 then50 ') Assistive device: Rolling walker (2 wheeled) Gait Pattern/deviations: Step-to  pattern;Step-through pattern Gait velocity: decr   General Gait Details: slow and steady, encouraged Pursed lip breaths  Stairs            Wheelchair Mobility    Modified Rankin (Stroke Patients Only)       Balance Overall balance assessment: Needs assistance Sitting-balance support: Feet supported;No upper extremity supported Sitting balance-Leahy Scale: Good     Standing balance support: During functional activity;Single extremity supported Standing balance-Leahy Scale: Fair                               Pertinent Vitals/Pain      Home Living Family/patient expects to be discharged to:: Private residence Living Arrangements: Spouse/significant other Available Help at Discharge: Family;Available 24 hours/day Type of Home: House Home Access: Stairs to enter Entrance Stairs-Rails: Psychiatric nurse of Steps: back- 2 with 2 rails, 6-front-uses  front Home Layout: One level Home Equipment: Clinical cytogeneticist - 4 wheels Additional Comments: standup walker- she loves    Prior Function Level of Independence: Independent with assistive device(s)         Comments: cruised in house using walls and furniture, used RW outside     Hand Dominance   Dominant Hand: Right    Extremity/Trunk Assessment        Lower Extremity Assessment Lower Extremity Assessment: Generalized weakness    Cervical / Trunk Assessment Cervical / Trunk Assessment: Normal  Communication   Communication: No difficulties  Cognition Arousal/Alertness: Awake/alert Behavior During Therapy: WFL for tasks assessed/performed Overall Cognitive Status: Within Functional Limits for tasks assessed  General Comments      Exercises     Assessment/Plan    PT Assessment Patient needs continued PT services  PT Problem List Decreased strength;Decreased safety awareness;Decreased mobility;Decreased knowledge  of precautions;Cardiopulmonary status limiting activity;Decreased knowledge of use of DME;Decreased activity tolerance       PT Treatment Interventions DME instruction;Therapeutic activities;Gait training;Therapeutic exercise;Patient/family education;Functional mobility training    PT Goals (Current goals can be found in the Care Plan section)  Acute Rehab PT Goals Patient Stated Goal: to go home today PT Goal Formulation: With patient Time For Goal Achievement: 07/19/20 Potential to Achieve Goals: Good    Frequency Min 3X/week   Barriers to discharge        Co-evaluation               AM-PAC PT "6 Clicks" Mobility  Outcome Measure Help needed turning from your back to your side while in a flat bed without using bedrails?: A Little Help needed moving from lying on your back to sitting on the side of a flat bed without using bedrails?: A Little Help needed moving to and from a bed to a chair (including a wheelchair)?: A Little Help needed standing up from a chair using your arms (e.g., wheelchair or bedside chair)?: A Little Help needed to walk in hospital room?: A Little Help needed climbing 3-5 steps with a railing? : A Lot 6 Click Score: 17    End of Session Equipment Utilized During Treatment: Oxygen Activity Tolerance: Patient tolerated treatment well Patient left: in chair;with call bell/phone within reach Nurse Communication: Mobility status PT Visit Diagnosis: Difficulty in walking, not elsewhere classified (R26.2)    Time: 6283-6629 PT Time Calculation (min) (ACUTE ONLY): 50 min   Charges:   PT Evaluation $PT Eval Low Complexity: 1 Low PT Treatments $Gait Training: 8-22 mins        [place] Pager [phone] Office [phone]   Claretha Cooper 07/05/2020, 1:23 PM

## 2020-07-05 NOTE — Progress Notes (Addendum)
SATURATION QUALIFICATIONS: (This note is used to comply with regulatory documentation for home oxygen)  Patient Saturations on Room Air at Rest = 93%  Patient Saturations on Room Air while Ambulating = 85%  Patient Saturations on 2 Liters of oxygen while Ambulating = 94%  Please briefly explain why patient needs home oxygen:to maintain saturation with activity.[place] Pager [phone] Office [phone]

## 2020-07-05 NOTE — Progress Notes (Signed)
Occupational Therapy Evaluation  Patient with functional deficits listed below impacting safety and independence with self care. Patient reports feeling weak, demonstrates limited standing activity tolerance and becomes short of breath with O2 dropping between 83-87% on room air with functional ambulation and ADLs. Patient min A donning mesh underwear with cues for compensatory strategies to start with R LE next time due to hx of TKA. Initiate education regarding energy conservation strategies such as using shower chair for bathing at home as patient did not utilize before. Recommend continued acute OT services to maximize patient activity tolerance and safety with ADLs to facilitate D/C to venue listed below.    07/05/20 1400  OT Visit Information  Last OT Received On 07/05/20  Assistance Needed +1  History of Present Illness 74 y.o. female with medical history significant of DM2, HTN, fatty liver, HLD, peripheral neuropathy, gout, GERD, diastolic CHF, OSA on CPAP comes to the hospital for evaluation of shortness of breath.  Patient states she has been feeling progressive shortness of breath with exertion and bilateral lower extremity swelling for past several weeks. ER patient has some signs of volume overload, CTA chest negative for PE but shows cardiomegaly, pulmonary edema and cirrhosis.  Admitted for CHF exacerbation.  Precautions  Precautions Fall  Precaution Comments monitor sats and HR  Restrictions  Weight Bearing Restrictions No  Home Living  Family/patient expects to be discharged to: Private residence  Living Arrangements Spouse/significant other  Available Help at Discharge Family;Available 24 hours/day  Type of Home House  Home Access Stairs to enter  Entrance Stairs-Number of Steps back- 2 with 2 rails, 6-front-uses  front  Entrance Stairs-Rails Right;Left  Home Layout One level  Scientist, clinical (histocompatibility and immunogenetics)  - 4 wheels  Additional Comments standup walker- she loves  Prior Function  Level of Independence Independent with assistive device(s)  Comments cruised in house using walls and furniture, used RW outside  Communication  Communication No difficulties  Pain Assessment  Pain Assessment No/denies pain  Cognition  Arousal/Alertness Awake/alert  Behavior During Therapy WFL for tasks assessed/performed  Overall Cognitive Status Within Functional Limits for tasks assessed  Upper Extremity Assessment  Upper Extremity Assessment Generalized weakness  Lower Extremity Assessment  Lower Extremity Assessment Defer to PT evaluation  Cervical / Trunk Assessment  Cervical / Trunk Assessment Normal  ADL  Overall ADL's  Needs assistance/impaired  Grooming Min guard;Oral care;Wash/dry face;Wash/dry hands;Standing  Grooming Details (indicate cue type and reason) limited activity tolerance in standing, O2 drop to 87% in standing at sink on room air  Upper Body Bathing Set up;Sitting  Lower Body Bathing Minimal assistance;Sitting/lateral leans;Sit to/from stand  Upper Body Dressing  Set up;Sitting  Lower Body Dressing Minimal assistance;Sitting/lateral leans;Sit to/from stand  Lower Body Dressing Details (indicate cue type and reason) patient require min A to initiate threading R LE into mesh underwear, educate patient in dressing more difficult/less mobile extremity first as patient has hx of knee replacement on R side   Toilet Transfer Minimal assistance;Ambulation;Regular Toilet;RW;Grab bars;Cueing for safety  Toileting- Clothing Manipulation and Hygiene Min guard;Sitting/lateral lean;Sit to/from stand  Toileting - Clothing Manipulation Details (indicate cue type and reason) patient in half squat position holding onto grab bar for stability to perform peri care after voiding, min G for safety  Functional mobility during ADLs Minimal assistance;Cueing for safety;Rolling walker  General ADL Comments  patient requiring increased assistance with self care due to weakness, decreased activity tolerance, shortness  of breath with activity  Bed Mobility  General bed mobility comments seated EOB upon arrival   Transfers  Overall transfer level Needs assistance  Equipment used Rolling walker (2 wheeled)  Transfers Sit to/from Stand  Sit to Stand Min guard  General transfer comment patient min G for safety to power up to standing, min a with functional ambulation and rolling walker due to decreased activity tolerance "I'm weak"   Balance  Overall balance assessment Needs assistance  Sitting-balance support Feet supported;No upper extremity supported  Sitting balance-Leahy Scale Good  Standing balance support During functional activity;Single extremity supported  Standing balance-Leahy Scale Poor  Standing balance comment patient require at least 1 UE support during functional tasks such as peri care  General Comments  General comments (skin integrity, edema, etc.) patient unable to maintain above 90% on room air with sink side adls, functional ambulation. cue in pursed lip breathing strategies during self care  OT - End of Session  Equipment Utilized During Treatment Oxygen;Rolling walker  Activity Tolerance Patient tolerated treatment well  Patient left in chair;with call bell/phone within reach;with chair alarm set  Nurse Communication Mobility status;Other (comment) (O2 on room air)  OT Assessment  OT Recommendation/Assessment Patient needs continued OT Services  OT Visit Diagnosis Other abnormalities of gait and mobility (R26.89)  OT Problem List Decreased activity tolerance;Decreased strength;Impaired balance (sitting and/or standing);Decreased safety awareness;Obesity;Decreased knowledge of use of DME or AE  OT Plan  OT Frequency (ACUTE ONLY) Min 2X/week  OT Treatment/Interventions (ACUTE ONLY) Self-care/ADL training;Energy conservation;DME and/or AE instruction;Patient/family  education;Balance training  AM-PAC OT "6 Clicks" Daily Activity Outcome Measure (Version 2)  Help from another person eating meals? 4  Help from another person taking care of personal grooming? 3  Help from another person toileting, which includes using toliet, bedpan, or urinal? 3  Help from another person bathing (including washing, rinsing, drying)? 3  Help from another person to put on and taking off regular upper body clothing? 3  Help from another person to put on and taking off regular lower body clothing? 3  6 Click Score 19  OT Recommendation  Follow Up Recommendations Home health OT;Supervision/Assistance - 24 hour  OT Equipment None recommended by OT  Individuals Consulted  Consulted and Agree with Results and Recommendations Patient  Acute Rehab OT Goals  Patient Stated Goal to go home today  OT Goal Formulation With patient  Time For Goal Achievement 07/19/20  Potential to Achieve Goals Good  OT Time Calculation  OT Start Time (ACUTE ONLY) 2376  OT Stop Time (ACUTE ONLY) 0919  OT Time Calculation (min) 27 min  OT General Charges  $OT Visit 1 Visit  OT Evaluation  $OT Eval Low Complexity 1 Low  Written Expression  Dominant Hand Right   Delbert Phenix OT OT pager: [phone]

## 2020-07-06 LAB — BASIC METABOLIC PANEL
Anion gap: 12 (ref 5–15)
BUN: 16 mg/dL (ref 8–23)
CO2: 26 mmol/L (ref 22–32)
Calcium: 8.9 mg/dL (ref 8.9–10.3)
Chloride: 103 mmol/L (ref 98–111)
Creatinine, Ser: 0.69 mg/dL (ref 0.44–1.00)
GFR calc Af Amer: >60 mL/min (ref >60)
GFR calc non Af Amer: >60 mL/min (ref >60)
Glucose, Bld: 127 mg/dL — ABNORMAL HIGH (ref 70–99)
Potassium: 3.5 mmol/L (ref 3.5–5.1)
Sodium: 141 mmol/L (ref 135–145)

## 2020-07-06 LAB — CBC
HCT: 37 % (ref 36.0–46.0)
Hemoglobin: 11.5 g/dL — ABNORMAL LOW (ref 12.0–15.0)
MCH: 27.1 pg (ref 26.0–34.0)
MCHC: 31.1 g/dL (ref 30.0–36.0)
MCV: 87.3 fL (ref 80.0–100.0)
Platelets: 306 10*3/uL (ref 150–400)
RBC: 4.24 MIL/uL (ref 3.87–5.11)
RDW: 15 % (ref 11.5–15.5)
WBC: 9.3 10*3/uL (ref 4.0–10.5)
nRBC: 0 % (ref 0.0–0.2)

## 2020-07-06 LAB — GLUCOSE, CAPILLARY
Glucose-Capillary: 123 mg/dL — ABNORMAL HIGH (ref 70–99)
Glucose-Capillary: 85 mg/dL (ref 70–99)

## 2020-07-06 LAB — MAGNESIUM: Magnesium: 1.9 mg/dL (ref 1.7–2.4)

## 2020-07-06 MED ORDER — FUROSEMIDE 20 MG PO TABS: 40.0000 mg | ORAL_TABLET | Freq: Two times a day (BID) | ORAL | 0 refills | Status: DC

## 2020-07-06 NOTE — Progress Notes (Signed)
Went over [employer] with patient and family.  All questions answered.  VSS.  HH needs set up.  Pt wheeled out via Therapist, sports.

## 2020-07-06 NOTE — TOC Transition Note (Signed)
Transition of Care [facility]) - CM/SW Discharge Note   Patient Details  Name: Wanda Hanson MRN: [id number] Date of Birth: Sep 01, 1946  Transition of [facility]) CM/SW Contact:  Dessa Phi, RN Phone Number: 07/06/2020, 11:11 AM   Clinical Narrative:   [facility] able to accept for HHPT/OT-rep Santiago Glad following for orders.No further CM needs.    Final next level of care: [place] Barriers to Discharge: No Barriers Identified   Patient Goals and CMS Choice Patient states their goals for this hospitalization and ongoing recovery are:: to go back home CMS Medicare.gov Compare Post Acute Care list provided to:: Patient Choice offered to / list presented to : Patient  Discharge Placement                       Discharge Plan and Services   Discharge Planning Services: CM Consult                      HH Arranged: PT, OT [facility] Agency: [place] ([facility]) Date [place]: 07/06/20 Time [place]: 1110 Representative spoke with at [place]: Arthur ([place]) Interventions     Readmission Risk Interventions No flowsheet data found.

## 2020-07-06 NOTE — Progress Notes (Signed)
PHYSICAL THERAPY  SATURATION QUALIFICATIONS: (This note is used to comply with regulatory documentation for home oxygen)  Patient Saturations on Room Air at Rest = 95%  Patient Saturations on Room Air while Ambulating 75 feet = 89%   Please briefly explain why patient needs home oxygen:  Pt does NOT require supplemental oxygen  Wanda Hanson  PTA Acute  Rehabilitation Services Pager      [phone] Office      [phone]

## 2020-07-06 NOTE — Progress Notes (Signed)
Physical Therapy Treatment Patient Details Name: Wanda Hanson MRN: [id number] DOB: [birth year] Today's Date: 07/06/2020    History of Present Illness 74 y.o. female with medical history significant of DM2, HTN, fatty liver, HLD, peripheral neuropathy, gout, GERD, diastolic CHF, OSA on CPAP comes to the hospital for evaluation of shortness of breath.  Patient states she has been feeling progressive shortness of breath with exertion and bilateral lower extremity swelling for past several weeks. ER patient has some signs of volume overload, CTA chest negative for PE but shows cardiomegaly, pulmonary edema and cirrhosis.  Admitted for CHF exacerbation.    PT Comments    Pt feeling much better.  Tolerated an increased distance in hallway. SATURATION QUALIFICATIONS: (This note is used to comply with regulatory documentation for home oxygen)  Patient Saturations on Room Air at Rest = 95%  Patient Saturations on Room Air while Ambulating 75 feet = 89% Please briefly explain why patient needs home oxygen:  Pt does NOT require supplemental oxygen    Follow Up Recommendations  Home health PT     Equipment Recommendations  None recommended by PT    Recommendations for Other Services       Precautions / Restrictions Precautions Precautions: Fall Precaution Comments: monitor sats and HR Restrictions Weight Bearing Restrictions: No    Mobility  Bed Mobility Overal bed mobility: Needs Assistance Bed Mobility: Supine to Sit              Transfers Overall transfer level: Needs assistance Equipment used: Rolling walker (2 wheeled) Transfers: Sit to/from [employer] Transfers Sit to Stand: Supervision Stand pivot transfers: Supervision       General transfer comment: good safety cognition and use of hands to steady self  Ambulation/Gait Ambulation/Gait assistance: Supervision Gait Distance (Feet): 115 Feet Assistive device: Rolling walker (2 wheeled) Gait  Pattern/deviations: Step-through pattern Gait velocity: decr   General Gait Details: slow but steady   Marine scientist [place] (Stroke Patients Only)       Balance                                            Cognition Arousal/Alertness: Awake/alert Behavior During Therapy: WFL for tasks assessed/performed Overall Cognitive Status: Within Functional Limits for tasks assessed                                        Exercises      General Comments        Pertinent Vitals/Pain Pain Assessment: No/denies pain    Home Living                      Prior Function            PT Goals (current goals can now be found in the care plan section) Progress towards PT goals: Progressing toward goals    Frequency    Min 3X/week      PT Plan Current plan remains appropriate    Co-evaluation              AM-PAC PT "6 Clicks" Mobility   Outcome Measure  Help needed turning from your back to your side while in  a flat bed without using bedrails?: None Help needed moving from lying on your back to sitting on the side of a flat bed without using bedrails?: None Help needed moving to and from a bed to a chair (including a wheelchair)?: None Help needed standing up from a chair using your arms (e.g., wheelchair or bedside chair)?: None Help needed to walk in hospital room?: None Help needed climbing 3-5 steps with a railing? : A Little 6 Click Score: 23    End of Session Equipment Utilized During Treatment: Gait belt Activity Tolerance: Patient tolerated treatment well Patient left: in bed;with call bell/phone within reach;with family/visitor present   PT Visit Diagnosis: Difficulty in walking, not elsewhere classified (R26.2)     Time: 7619-5093 PT Time Calculation (min) (ACUTE ONLY): 18 min  Charges:  $Gait Training: 8-22 mins                     Rica Koyanagi  PTA Acute   Rehabilitation Services Pager      [phone] Office      [phone]

## 2020-07-06 NOTE — Discharge Summary (Signed)
Physician Discharge Summary  Wanda Hanson TMA:[id number] DOB: [birth year] DOA: 07/02/2020  PCP: Parke Poisson, MD  Admit date: 07/02/2020 Discharge date: 07/06/2020  Admitted From: Home Disposition:  Home  Recommendations for Outpatient Follow-up:  1. Follow up with PCP in 1-2 weeks 2. Please obtain BMP/CBC in one week 3. Please follow up on the following pending results:  Home Health:Yes PT/OT  Equipment/Devices: None  Discharge Condition: Stable Code Status:   Code Status: Full Code Diet recommendation:  Diet Order            Diet Heart Room service appropriate? Yes; Fluid consistency: Thin; Fluid restriction: 1500 mL Fluid  Diet effective now                  Brief/Interim Summary: 74 y.o.femalewith medical history significant ofDM2, HTN, fatty liver, HLD, peripheral neuropathy, gout, GERD, diastolic CHF, OSA on CPAP comes to the hospital for evaluation of shortness of breath. Patient states she has been feeling progressive shortness of breath with exertion and bilateral lower extremity swelling for past several weeks. ER patient has some signs of volume overload, CTA chest negative for PE but shows cardiomegaly, pulmonary edema and cirrhosis. Admitted for CHF exacerbation.  Her symptoms improved with IV diuretics, echocardiogram showed EF of 60% with grade 2 diastolic dysfunction. Patient has treated along the CHF pathway.  She has been diuresed well with it is improved to 205 pound from 218 pound on admission.  She is able to cough with oxygen and normal shortness of breath.  She walked with physical therapy today did well denied any shortness of breath chest pain fever chills.  She has been off oxygen.  Her pulse ox last was 89% on ambulation which is very brief. Patient is interested to go home today and at this time since he is not needing oxygen and symptomatically improved will discharge on increased dose of Lasix with CHF instruction  We will set up home health PT  OT.    Discharge Diagnoses:   Acute respiratory failure with hypoxia/acute congestive heart failure with preserved EF 60% COVID-19 negative, TREATED W/ IV Lasix, off oxygen overall appears euvolemic will switch to p.o. Lasix at 40 mg BID ( was on 20 mg BID PTA) for d/c home. She is advised to continue on this dose ,see pcp or cardiology in 1 week to see if she can go back on 20 mg bid or need to continue on higher dose. She needs Echo reviewed EF 25% grade 2 diastolic dysfunction.  74 will discharge with CHF instruction and weight monitoring PT OT.-Home health services.   Essential hypertension: BP well controlled continue Norvasc losartan and Bystolic. OSA on CPAP Moderate obesity-will benefit with weight loss. Shortness of breath Hyperlipidemia continue Lipitor Peripheral neuropathy continue Cymbalta and Neurontin Diabetes A1c 6.9 glipizide held off due to episode of hypoglycemia, follow-up with PCP to resume. Recent Labs  Lab 07/05/20 1202 07/05/20 1635 07/05/20 2057 07/06/20 0756 07/06/20 1138  GLUCAP 99 99 117* 123* 85   Consults:  none  Subjective: Off O2 and doing well on RA.  Discharge Exam: Vitals:   07/05/20 2100 07/06/20 0528  BP: 125/68 (!) 130/54  Pulse: 74 68  Resp: 18 18  Temp: 98.5 F (36.9 C) 98.3 F (36.8 C)  SpO2: 93% 95%   General: Pt is alert, awake, not in acute distress Cardiovascular: RRR, S1/S2 +, no rubs, no gallops Respiratory: CTA bilaterally, no wheezing, no rhonchi Abdominal: Soft, NT, ND, bowel sounds + Extremities:  no edema, no cyanosis  Discharge Instructions  Discharge Instructions    (HEART FAILURE PATIENTS) Call MD:  Anytime you have any of the following symptoms: 1) 3 pound weight gain in 24 hours or 5 pounds in 1 week 2) shortness of breath, with or without a dry hacking cough 3) swelling in the hands, feet or stomach 4) if you have to sleep on extra pillows at night in order to breathe.   Complete by: As directed    Discharge  instructions   Complete by: As directed    Please call call MD or return to ER for similar or worsening recurring problem that brought you to hospital or if any fever,nausea/vomiting,abdominal pain, uncontrolled pain, chest pain,  shortness of breath or any other alarming symptoms.  Please check your blood sugar 3-4 times a day at home your glipizide has been stopped  due to low sugar- if blood sugar starts to go above 200 while waiting to see PCP please call PCP to resume glipizide at lower dose.  Please follow-up your doctor as instructed in a week time and after follow-up if okay with your primary care doctor go back to lasix 20 mg Lasix twice daily or continue at increased dose of 40 mg bid as current dose Please call the office for appointment.  Please avoid alcohol, smoking, or any other illicit substance and maintain healthy habits including taking your regular medications as prescribed.  You were cared for by a hospitalist during your hospital stay. If you have any questions about your discharge medications or the care you received while you were in the hospital after you are discharged, you can call the unit and ask to speak with the hospitalist on call if the hospitalist that took care of you is not available.  Once you are discharged, your primary care physician will handle any further medical issues. Please note that NO REFILLS for any discharge medications will be authorized once you are discharged, as it is imperative that you return to your primary care physician (or establish a relationship with a primary care physician if you do not have one) for your aftercare needs so that they can reassess your need for medications and monitor your lab values   Increase activity slowly   Complete by: As directed      Allergies as of 07/06/2020      Reactions   Benzalkonium Chloride Dermatitis, Swelling   At The Site of Application At The Site of Application   Cortisone Swelling    Neomycin-bacitracin-polymyxin [bacitracin-neomycin-polymyxin] Swelling   Pollen Extract Other (See Comments)   Typical "Hay Fever" symptoms Stuffy nose   Ace Inhibitors Swelling   Asa [aspirin]    Ulcer   Latex    Morphine And Related    SOB   Neosporin [bacitracin-polymyxin B]    Swelling where applied       Medication List    STOP taking these medications   glipiZIDE 5 MG tablet Commonly known as: GLUCOTROL     TAKE these medications   acetaminophen 325 MG tablet Commonly known as: TYLENOL Take 325 mg by mouth every 6 (six) hours as needed for mild pain or headache.   allopurinol 300 MG tablet Commonly known as: ZYLOPRIM Take 300 mg by mouth daily.   amLODipine 5 MG tablet Commonly known as: NORVASC Take 10 mg by mouth daily.   atorvastatin 40 MG tablet Commonly known as: LIPITOR Take 40 mg by mouth daily.   diclofenac Sodium 1 %  Gel Commonly known as: VOLTAREN Apply 2 g topically 4 (four) times daily as needed (right toe pain).   DULoxetine 60 MG capsule Commonly known as: CYMBALTA Take 60 mg by mouth daily.   furosemide 20 MG tablet Commonly known as: LASIX Take 2 tablets (40 mg total) by mouth 2 (two) times daily for 7 days. After 1 week please go back to your usual dose of 20 mg BID after following up with your primary care doctor What changed:   how much to take  additional instructions   gabapentin 800 MG tablet Commonly known as: NEURONTIN Take 1,600 mg by mouth 2 (two) times daily.   LORazepam 0.5 MG tablet Commonly known as: ATIVAN Take 1 mg by mouth at bedtime.   losartan 50 MG tablet Commonly known as: COZAAR Take 1 tablet (50 mg total) by mouth daily.   nebivolol 5 MG tablet Commonly known as: Bystolic Take 1 tablet (5 mg total) by mouth daily.   nilotinib 200 MG capsule Commonly known as: TASIGNA Take 200 mg by mouth every 12 (twelve) hours. Give on an empty stomach 1 hour before or 2 hours after meals.   polyvinyl alcohol 1.4  % ophthalmic solution Commonly known as: LIQUIFILM TEARS Place 1 drop into both eyes as needed for dry eyes.   Potassium Chloride ER 20 MEQ Tbcr Take 20 mEq by mouth daily.   rOPINIRole 1 MG tablet Commonly known as: REQUIP Take 3 mg by mouth at bedtime.   Symbicort 160-4.5 MCG/ACT inhaler Generic drug: budesonide-formoterol INHALE TWO PUFFS BY MOUTH TWICE A DAY What changed: See the new instructions.   Trulicity 5.18 AC/1.6SA Sopn Generic drug: Dulaglutide Inject 0.75 mg into the skin once a week.       Follow-up Information    [facility] Follow up.   Why: [place] physical therapy/occupational therapy       Parke Poisson, MD Follow up in 1 week(s).   Specialty: Internal Medicine Contact information: [address] [place]  [address] [phone]              Allergies  Allergen Reactions  . Benzalkonium Chloride Dermatitis and Swelling    At The Site of Application At The Site of Application   . Cortisone Swelling  . Neomycin-Bacitracin-Polymyxin [Bacitracin-Neomycin-Polymyxin] Swelling  . Pollen Extract Other (See Comments)    Typical "Hay Fever" symptoms Stuffy nose  . Ace Inhibitors Swelling  . Asa [Aspirin]     Ulcer   . Latex   . Morphine And Related     SOB   . Neosporin [Bacitracin-Polymyxin B]     Swelling where applied     The results of significant diagnostics from this hospitalization (including imaging, microbiology, ancillary and laboratory) are listed below for reference.    Microbiology: Recent Results (from the past 240 hour(s))  SARS Coronavirus 2 by RT PCR (hospital order, performed in [facility] hospital lab) Nasopharyngeal Nasopharyngeal Swab     Status: None   Collection Time: 07/02/20 12:00 PM   Specimen: Nasopharyngeal Swab  Result Value Ref Range Status   SARS Coronavirus 2 NEGATIVE NEGATIVE Final    Comment: (NOTE) SARS-CoV-2 target nucleic acids are NOT DETECTED.  The SARS-CoV-2 RNA is  generally detectable in upper and lower respiratory specimens during the acute phase of infection. The lowest concentration of SARS-CoV-2 viral copies this assay can detect is 250 copies / mL. A negative result does not preclude SARS-CoV-2 infection and should not be used as  the sole basis for treatment or other patient management decisions.  A negative result may occur with improper specimen collection / handling, submission of specimen other than nasopharyngeal swab, presence of viral mutation(s) within the areas targeted by this assay, and inadequate number of viral copies (<250 copies / mL). A negative result must be combined with clinical observations, patient history, and epidemiological information.  Fact Sheet for Patients:   StrictlyIdeas.no  Fact Sheet for Healthcare Providers: BankingDealers.co.za  This test is not yet approved or  cleared by the [place] FDA and has been authorized for detection and/or diagnosis of SARS-CoV-2 by FDA under an Emergency Use Authorization (EUA).  This EUA will remain in effect (meaning this test can be used) for the duration of the COVID-19 declaration under Section 564(b)(1) of the Act, 21 U.S.C. section 360bbb-3(b)(1), unless the authorization is terminated or revoked sooner.  Performed at [facility], [place] [address] [place], [place] [address]   MRSA PCR Screening     Status: None   Collection Time: 07/03/20  8:37 AM   Specimen: Nasal Mucosa; Nasopharyngeal  Result Value Ref Range Status   MRSA by PCR NEGATIVE NEGATIVE Final    Comment:        The GeneXpert MRSA Assay (FDA approved for NASAL specimens only), is one component of a comprehensive MRSA colonization surveillance program. It is not intended to diagnose MRSA infection nor to guide or monitor treatment for MRSA infections. Performed at [facility], [place] [address]  [place], [place] [address]     Procedures/Studies: CT Angio Chest PE W and/or Wo Contrast  Result Date: 07/02/2020 CLINICAL DATA:  74 year old female with chest pain and shortness of breath. EXAM: CT ANGIOGRAPHY CHEST WITH CONTRAST TECHNIQUE: Multidetector CT imaging of the chest was performed using the standard protocol during bolus administration of intravenous contrast. Multiplanar CT image reconstructions and MIPs were obtained to evaluate the vascular anatomy. CONTRAST:  165mL OMNIPAQUE IOHEXOL 350 MG/ML SOLN COMPARISON:  Chest CT dated 06/23/2020. FINDINGS: Evaluation of this exam is limited due to respiratory motion artifact. Cardiovascular: There is mild cardiomegaly. No pericardial effusion. Coronary vascular calcification of the LAD. There is mild atherosclerotic calcification of the thoracic aorta. No aneurysmal dilatation or dissection. Evaluation of the pulmonary arteries is limited due to respiratory motion artifact. No pulmonary artery embolus identified. Mediastinum/Nodes: No hilar adenopathy. Mildly enlarged mediastinal lymph nodes measuring approximately 13 mm in short axis anterior to the carina. Overall interval increase in the size of the mediastinal lymph nodes compared to the CT of 06/23/2020. the esophagus is grossly unremarkable. No mediastinal fluid collection Lungs/Pleura: Small bilateral pleural effusions. There are bilateral lower lobe partial consolidative changes which may represent atelectasis or pneumonia. There is diffuse interstitial and interlobular septal prominence most likely edema. There is no pneumothorax. The central airways are patent. Upper Abdomen: Cirrhosis. Cholecystectomy. Linear calcification of the splenic capsule. Partially visualized small ascites. Musculoskeletal: Degenerative changes of the spine. No acute osseous pathology. Review of the MIP images confirms the above findings. IMPRESSION: 1. No CT evidence of pulmonary embolism. 2. Mild cardiomegaly with findings  of CHF and small bilateral pleural effusions. 3. Bilateral lower lobe atelectasis or pneumonia. Clinical correlation and follow-up to resolution recommended. 4. Mildly enlarged mediastinal lymph nodes, likely reactive. 5. Cirrhosis with small ascites. 6. Aortic Atherosclerosis (ICD10-I70.0). Electronically Signed   By: Anner Crete M.D.   On: 07/02/2020 16:32   CT Angio Chest PE W/Cm &/Or Wo Cm  Result Date:  06/23/2020 CLINICAL DATA:  Shortness of breath EXAM: CT ANGIOGRAPHY CHEST WITH CONTRAST TECHNIQUE: Multidetector CT imaging of the chest was performed using the standard protocol during bolus administration of intravenous contrast. Multiplanar CT image reconstructions and MIPs were obtained to evaluate the vascular anatomy. CONTRAST:  58mL OMNIPAQUE IOHEXOL 350 MG/ML SOLN COMPARISON:  None. FINDINGS: Cardiovascular: There is a optimal opacification of the pulmonary arteries. There is no central,segmental, or subsegmental filling defects within the pulmonary arteries. There is mild cardiomegaly. No pericardial effusion or thickening. No evidence right heart strain. There is normal three-vessel brachiocephalic anatomy without proximal stenosis. Scattered aortic atherosclerosis is noted. Mediastinum/Nodes: No hilar, mediastinal, or axillary adenopathy. Thyroid gland, trachea, and esophagus demonstrate no significant findings. Lungs/Pleura: Mild bibasilar dependent streaky atelectasis is noted. No large airspace consolidation is seen. No pleural effusion or pneumothorax. No airspace consolidation. Upper Abdomen: No acute abnormalities present in the visualized portions of the upper abdomen. Calcifications within the spleen are seen. A small hiatal hernia is present. Musculoskeletal: No chest wall abnormality. No acute or significant osseous findings. Review of the MIP images confirms the above findings. IMPRESSION: No central, segmental, or subsegmental pulmonary embolism. No intrathoracic pathology to  explain the patient's symptoms. Aortic Atherosclerosis (ICD10-I70.0). Electronically Signed   By: Prudencio Pair M.D.   On: 06/23/2020 23:25   US Venous Img Lower Right (DVT Study)  Result Date: 06/23/2020 CLINICAL DATA:  Lower extremity edema EXAM: Right LOWER EXTREMITY VENOUS DOPPLER ULTRASOUND TECHNIQUE: Gray-scale sonography with compression, as well as color and duplex ultrasound, were performed to evaluate the deep venous system(s) from the level of the common femoral vein through the popliteal and proximal calf veins. COMPARISON:  None. FINDINGS: VENOUS Normal compressibility of the common femoral, superficial femoral, and popliteal veins, as well as the visualized calf veins. Visualized portions of profunda femoral vein and great saphenous vein unremarkable. No filling defects to suggest DVT on grayscale or color Doppler imaging. Doppler waveforms show normal direction of venous flow, normal respiratory plasticity and response to augmentation. Limited views of the contralateral common femoral vein are unremarkable. OTHER None. Limitations: none IMPRESSION: Negative. Electronically Signed   By: Prudencio Pair M.D.   On: 06/23/2020 21:09   DG Chest Port 1 View  Result Date: 07/05/2020 CLINICAL DATA:  Dyspnea. EXAM: PORTABLE CHEST 1 VIEW COMPARISON:  07/02/2020 FINDINGS: Lungs are adequately inflated with patchy density over the right midlung which is new. No evidence of effusion. Mild hazy prominence of the perihilar vessels. Borderline stable cardiomegaly. Remainder the exam is unchanged. IMPRESSION: 1. New patchy density over the right midlung which may be due to atelectasis or infection. 2.  Suggestion of mild vascular congestion. Electronically Signed   By: Marin Olp M.D.   On: 07/05/2020 08:00   DG Chest Port 1 View  Result Date: 07/02/2020 CLINICAL DATA:  Shortness of breath EXAM: PORTABLE CHEST 1 VIEW COMPARISON:  June 23, 2020 chest radiograph and CT angiogram chest FINDINGS: Lungs are  clear. Heart is borderline enlarged with pulmonary vascularity normal. No adenopathy. There is aortic atherosclerosis. No bone lesions. IMPRESSION: Heart borderline enlarged.  Lungs clear. Aortic Atherosclerosis (ICD10-I70.0). Electronically Signed   By: Lowella Grip III M.D.   On: 07/02/2020 12:35   DG Chest Portable 1 View  Result Date: 06/23/2020 CLINICAL DATA:  Shortness of breath and subjective fevers with altered sense of taste. EXAM: PORTABLE CHEST 1 VIEW COMPARISON:  10/28/2019 FINDINGS: Trachea is midline. Cardiomediastinal contours stable enlarged accounting for AP projection and portable technique.  Hilar structures with suggestion of central vascular engorgement. No frank edema. No consolidation. No sign of pleural effusion. Skeletal structures on limited assessment without acute process. IMPRESSION: Cardiomegaly with central vascular engorgement. No frank edema or consolidation. Electronically Signed   By: Zetta Bills M.D.   On: 06/23/2020 14:13   ECHOCARDIOGRAM COMPLETE  Result Date: 07/03/2020    ECHOCARDIOGRAM REPORT   Patient Name:   Wanda Hanson Date of Exam: 07/03/2020 Medical Rec #:  [id number]        Height:       60.0 in Accession #:    [id number]       Weight:       218.3 lb Date of Birth:  [birth year]        BSA:          1.937 m Patient Age:    33 years         BP:           146/63 mmHg Patient Gender: F                HR:           74 bpm. Exam Location:  Inpatient Procedure: 2D Echo Indications:    Cardiomyopathy-Unspecified 425.9 / I42.9  History:        Patient has prior history of Echocardiogram examinations, most                 recent 10/18/2019. Risk Factors:Sleep Apnea, Hypertension,                 Dyslipidemia and Diabetes. GERD. Fatty liver.  Sonographer:    Darlina Sicilian RDCS Referring Phys: [id number] ANKIT CHIRAG AMIN IMPRESSIONS  1. Left ventricular ejection fraction, by estimation, is 60 to 65%. The left ventricle has normal function. The left ventricle has no  regional wall motion abnormalities. Left ventricular diastolic parameters are consistent with Grade II diastolic dysfunction (pseudonormalization). Elevated left atrial pressure.  2. Right ventricular systolic function is normal. The right ventricular size is mildly enlarged. Tricuspid regurgitation signal is inadequate for assessing PA pressure.  3. The mitral valve is normal in structure. No evidence of mitral valve regurgitation. No evidence of mitral stenosis.  4. The aortic valve is grossly normal. Aortic valve regurgitation is not visualized. No aortic stenosis is present.  5. The inferior vena cava is dilated in size with <50% respiratory variability, suggesting right atrial pressure of 15 mmHg. Comparison(s): Prior images reviewed side by side. The left ventricular systolic function is unchanged. The left ventricular diastolic function is significantly worse. Both right and left heart filling pressures are now elevated. FINDINGS  Left Ventricle: Left ventricular ejection fraction, by estimation, is 60 to 65%. The left ventricle has normal function. The left ventricle has no regional wall motion abnormalities. The left ventricular internal cavity size was normal in size. There is  no left ventricular hypertrophy. Left ventricular diastolic parameters are consistent with Grade II diastolic dysfunction (pseudonormalization). Elevated left atrial pressure. Right Ventricle: The right ventricular size is mildly enlarged. No increase in right ventricular wall thickness. Right ventricular systolic function is normal. Tricuspid regurgitation signal is inadequate for assessing PA pressure. Left Atrium: Left atrial size was normal in size. Right Atrium: Right atrial size was normal in size. Pericardium: There is no evidence of pericardial effusion. Mitral Valve: The mitral valve is normal in structure. Normal mobility of the mitral valve leaflets. No evidence of mitral valve regurgitation. No evidence of mitral valve  stenosis.  Tricuspid Valve: The tricuspid valve is normal in structure. Tricuspid valve regurgitation is not demonstrated. No evidence of tricuspid stenosis. Aortic Valve: The aortic valve is grossly normal. Aortic valve regurgitation is not visualized. No aortic stenosis is present. Pulmonic Valve: The pulmonic valve was grossly normal. Pulmonic valve regurgitation is not visualized. No evidence of pulmonic stenosis. Aorta: The aortic root is normal in size and structure. Venous: The inferior vena cava is dilated in size with less than 50% respiratory variability, suggesting right atrial pressure of 15 mmHg. IAS/Shunts: The atrial septum is grossly normal.  LEFT VENTRICLE PLAX 2D LVIDd:         4.60 cm  Diastology LVIDs:         3.30 cm  LV e' lateral:   5.44 cm/s LV PW:         1.00 cm  LV E/e' lateral: 24.4 LV IVS:        1.10 cm  LV e' medial:    5.33 cm/s LVOT diam:     1.80 cm  LV E/e' medial:  25.0 LV SV:         84 LV SV Index:   43 LVOT Area:     2.54 cm  RIGHT VENTRICLE RV S prime:     15.90 cm/s TAPSE (M-mode): 2.8 cm LEFT ATRIUM             Index       RIGHT ATRIUM           Index LA diam:        3.20 cm 1.65 cm/m  RA Area:     17.10 cm LA Vol (A2C):   43.2 ml 22.30 ml/m RA Volume:   44.90 ml  23.18 ml/m LA Vol (A4C):   54.9 ml 28.34 ml/m LA Biplane Vol: 50.6 ml 26.12 ml/m  AORTIC VALVE LVOT Vmax:   150.00 cm/s LVOT Vmean:  104.000 cm/s LVOT VTI:    0.330 m  AORTA Ao Root diam: 3.30 cm MITRAL VALVE MV Area (PHT): 4.49 cm     SHUNTS MV Decel Time: 169 msec     Systemic VTI:  0.33 m MV E velocity: 133.00 cm/s  Systemic Diam: 1.80 cm MV A velocity: 115.00 cm/s MV E/A ratio:  1.16 Mihai Croitoru MD Electronically signed by Sanda Klein MD Signature Date/Time: 07/03/2020/1:26:51 PM    Final     Labs: BNP (last 3 results) Recent Labs    06/23/20 2028 07/02/20 1232 07/05/20 0530  BNP 182.0* 390.8* 885.0*   Basic Metabolic Panel: Recent Labs  Lab 07/02/20 1232 07/03/20 0522 07/04/20 0204  07/05/20 0530 07/06/20 0524  NA 143 140 139 140 141  K 3.7 3.9 3.6 3.1* 3.5  CL 107 105 104 101 103  CO2 27 25 26 27 26   GLUCOSE 122* 119* 103* 127* 127*  BUN 15 14 17 16 16   CREATININE 0.77 0.81 0.93 0.67 0.69  CALCIUM 9.2 8.8* 8.7* 9.1 8.9  MG  --  1.9 1.9 1.9 1.9   Liver Function Tests: Recent Labs  Lab 07/02/20 1232  AST 37  ALT 31  ALKPHOS 112  BILITOT 2.0*  PROT 8.0  ALBUMIN 4.2   No results for input(s): LIPASE, AMYLASE in the last 168 hours. No results for input(s): AMMONIA in the last 168 hours. CBC: Recent Labs  Lab 07/02/20 1232 07/03/20 0522 07/04/20 0204 07/05/20 0530 07/06/20 0524  WBC 13.0* 12.5* 12.1* 10.3 9.3  HGB 11.5* 11.2* 10.9* 11.6* 11.5*  HCT  36.7 35.5* 35.0* 36.7 37.0  MCV 86.4 87.7 88.4 86.6 87.3  PLT 269 260 266 296 306   Cardiac Enzymes: No results for input(s): CKTOTAL, CKMB, CKMBINDEX, TROPONINI in the last 168 hours. BNP: Invalid input(s): POCBNP CBG: Recent Labs  Lab 07/05/20 1202 07/05/20 1635 07/05/20 2057 07/06/20 0756 07/06/20 1138  GLUCAP 99 99 117* 123* 85   D-Dimer No results for input(s): DDIMER in the last 72 hours. Hgb A1c No results for input(s): HGBA1C in the last 72 hours. Lipid Profile No results for input(s): CHOL, HDL, LDLCALC, TRIG, CHOLHDL, LDLDIRECT in the last 72 hours. Thyroid function studies No results for input(s): TSH, T4TOTAL, T3FREE, THYROIDAB in the last 72 hours.  Invalid input(s): FREET3 Anemia work up No results for input(s): VITAMINB12, FOLATE, FERRITIN, TIBC, IRON, RETICCTPCT in the last 72 hours. Urinalysis    Component Value Date/Time   COLORURINE YELLOW 06/23/2020 2151   APPEARANCEUR HAZY (A) 06/23/2020 2151   LABSPEC 1.020 06/23/2020 2151   PHURINE 5.5 06/23/2020 2151   GLUCOSEU NEGATIVE 06/23/2020 2151   HGBUR NEGATIVE 06/23/2020 2151   BILIRUBINUR NEGATIVE 06/23/2020 2151   KETONESUR NEGATIVE 06/23/2020 2151   PROTEINUR NEGATIVE 06/23/2020 2151   NITRITE NEGATIVE  06/23/2020 2151   LEUKOCYTESUR NEGATIVE 06/23/2020 2151   Sepsis Labs Invalid input(s): PROCALCITONIN,  WBC,  LACTICIDVEN Microbiology Recent Results (from the past 240 hour(s))  SARS Coronavirus 2 by RT PCR (hospital order, performed in [place] hospital lab) Nasopharyngeal Nasopharyngeal Swab     Status: None   Collection Time: 07/02/20 12:00 PM   Specimen: Nasopharyngeal Swab  Result Value Ref Range Status   SARS Coronavirus 2 NEGATIVE NEGATIVE Final    Comment: (NOTE) SARS-CoV-2 target nucleic acids are NOT DETECTED.  The SARS-CoV-2 RNA is generally detectable in upper and lower respiratory specimens during the acute phase of infection. The lowest concentration of SARS-CoV-2 viral copies this assay can detect is 250 copies / mL. A negative result does not preclude SARS-CoV-2 infection and should not be used as the sole basis for treatment or other patient management decisions.  A negative result may occur with improper specimen collection / handling, submission of specimen other than nasopharyngeal swab, presence of viral mutation(s) within the areas targeted by this assay, and inadequate number of viral copies (<250 copies / mL). A negative result must be combined with clinical observations, patient history, and epidemiological information.  Fact Sheet for Patients:   StrictlyIdeas.no  Fact Sheet for Healthcare Providers: BankingDealers.co.za  This test is not yet approved or  cleared by the [place] FDA and has been authorized for detection and/or diagnosis of SARS-CoV-2 by FDA under an Emergency Use Authorization (EUA).  This EUA will remain in effect (meaning this test can be used) for the duration of the COVID-19 declaration under Section 564(b)(1) of the Act, 21 U.S.C. section 360bbb-3(b)(1), unless the authorization is terminated or revoked sooner.  Performed at [facility], [place] [address], [place], [place] [address]   MRSA PCR Screening     Status: None   Collection Time: 07/03/20  8:37 AM   Specimen: Nasal Mucosa; Nasopharyngeal  Result Value Ref Range Status   MRSA by PCR NEGATIVE NEGATIVE Final    Comment:        The GeneXpert MRSA Assay (FDA approved for NASAL specimens only), is one component of a comprehensive MRSA colonization surveillance program. It is not intended to diagnose MRSA infection nor to guide or monitor treatment for MRSA infections. Performed  at [facility], [place] [address] [place], [place] [address]      Time coordinating discharge: 35  minutes  SIGNED: Antonieta Pert, MD  [facility] 07/06/2020, 12:49 PM  If 7PM-7AM, please contact night-coverage [email or web address]

## 2020-07-17 ENCOUNTER — Other Ambulatory Visit: Payer: Self-pay

## 2020-07-17 ENCOUNTER — Ambulatory Visit: Payer: Medicare Other | Admitting: Adult Health

## 2020-07-17 ENCOUNTER — Encounter: Payer: Self-pay | Admitting: Adult Health

## 2020-07-17 DIAGNOSIS — Z9989 Dependence on other enabling machines and devices: Secondary | ICD-10-CM | POA: Diagnosis not present

## 2020-07-17 DIAGNOSIS — G4733 Obstructive sleep apnea (adult) (pediatric): Secondary | ICD-10-CM

## 2020-07-17 DIAGNOSIS — I5189 Other ill-defined heart diseases: Secondary | ICD-10-CM | POA: Diagnosis not present

## 2020-07-17 DIAGNOSIS — J4530 Mild persistent asthma, uncomplicated: Secondary | ICD-10-CM | POA: Diagnosis not present

## 2020-07-17 NOTE — Assessment & Plan Note (Signed)
Excellent control compliance on nocturnal CPAP no changes  Plan  Patient Instructions  Continue Symibcort 2 puffs Twice daily  , rinse after use.  Low salt diet  Keep legs elevated.  Continue follow up with \\Cardiology .  Continue on CPAP At bedtime   ProAir Inhaler 1-2 puffs every 4hrs as needed As needed   Follow up with Dr. Halford Chessman as planned with chest xray .  Please contact office for sooner follow up if symptoms do not improve or worsen or seek emergency care

## 2020-07-17 NOTE — Patient Instructions (Addendum)
Continue Symibcort 2 puffs Twice daily  , rinse after use.  Low salt diet  Keep legs elevated.  Continue follow up with \\Cardiology .  Continue on CPAP At bedtime   ProAir Inhaler 1-2 puffs every 4hrs as needed As needed   Follow up with Dr. Halford Chessman as planned with chest xray .  Please contact office for sooner follow up if symptoms do not improve or worsen or seek emergency care

## 2020-07-17 NOTE — Assessment & Plan Note (Signed)
Chronic diastolic heart failure.  Recent hospitalization due to decompensated with volume overload.  Patient is improved after diuresis.  She is following with cardiology and has an upcoming cardiac stress test.  Continue on current regimen along with low-salt diet.  We will check chest x-ray on return visit.

## 2020-07-17 NOTE — Progress Notes (Signed)
@Patient  ID: Wanda Hanson, female    DOB: 1946/02/24, 74 y.o.   MRN: [id number]  Chief Complaint  Patient presents with  . Follow-up    OSA     Referring provider: Parke Poisson, MD  HPI: 74 year old female never smoker followed for obstructive sleep apnea on nocturnal CPAP. And mild intermittent asthma Medical history significant for bilateral pneumonia 2018, CML, diabetes, hypertension  TEST/EVENTS :  Auto CPAP 10/10/17 to 01/07/18 >>used on 78 of 90 nights with average 7 hrs 5 min. Average AHI 0.5 with median CPAP 9 and 95 th percentile CPAP 11 cm H2O. HST 01/31/18 >> AHI 7.4, SaO2 low 80%. AutoCPAP 09/27/19 to 10/26/19 >>used on 30 of 30 nights with average 7 hrs 58 min. Average AHI 0.8 with median CPAP 10 and 95 th percentile CPAP 13 cm H2O.  10/02/2019 CTA Chest No CT imaging evidence of acute pulmonary thromboembolic disease to the level of the segmental pulmonary arteries.  Pulmonary mosaicism, partially accentuated by incidental expiratory phase images with diffuse airway wall thickening, may favor small airway disease. No definite pneumonia. Similar cirrhotic liver morphology with trace perihepatic ascites  PFT December 27, 2019 mild restriction with an FEV1 at 85%, ratio 88, FVC 73%  07/17/2020 Follow up : Asthma , OSA, post hospital follow-up Patient presents for a post hospital follow-up.  Patient was recently admitted for decompensated congestive heart failure.  She was treated with diuresis with improvement.  She has had follow-up with cardiology since discharge.  2D echo showed an EF at 67% grade 2 diastolic dysfunction.  Preserved EF at 60%.  Patient says she has been set up for an outpatient stress test.  Since discharge she says she is feeling better but remains weak and gets winded with minimum activity.  Patient has underlying sleep apnea peer remains on CPAP.  She says she is doing well on CPAP.  She never misses a night.  CPAP download shows  excellent compliance with daily average usage at 9 hours.  Patient is on auto CPAP 5 to 15 cm H2O.  AHI 0.9.  Patient does have some mild persistent asthma.  Previous pulmonary function testing showed no airflow obstruction with mild restriction.  Patient says she had weaned off of Symbicort earlier this year but restarted when her breathing started to get better.  She does feel like it is helping.  She denies any increased albuterol use.    Allergies  Allergen Reactions  . Benzalkonium Chloride Dermatitis and Swelling    At The Site of Application At The Site of Application   . Cortisone Swelling  . Neomycin-Bacitracin-Polymyxin [Bacitracin-Neomycin-Polymyxin] Swelling  . Pollen Extract Other (See Comments)    Typical "Hay Fever" symptoms Stuffy nose  . Ace Inhibitors Swelling  . Asa [Aspirin]     Ulcer   . Latex   . Morphine And Related     SOB   . Neosporin [Bacitracin-Polymyxin B]     Swelling where applied     Immunization History  Administered Date(s) Administered  . Fluad Quad(high Dose 65+) 10/28/2019  . Hep A / Hep B 03/06/2014, 04/18/2014, 09/18/2014  . Influenza Split 08/08/2017  . Influenza, High Dose Seasonal PF 09/18/2014, 09/07/2015, 07/22/2017, 08/24/2018, 09/07/2018, 10/28/2019  . Influenza,inj,Quad PF,6+ Mos 08/21/2015  . Influenza-Unspecified 08/27/2010, 08/25/2011, 08/26/2012, 10/04/2013, 08/08/2017  . Pneumococcal Conjugate-13 06/13/2015, 08/08/2017  . Pneumococcal Polysaccharide-23 05/07/2012  . Tdap 05/07/2012    Past Medical History:  Diagnosis Date  . Chronic back pain   .  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 Depression   . Diabetes mellitus ([place])   . Eczema   . Fatty liver   . GERD (gastroesophageal reflux disease)   . Gout   . Hyperlipidemia   . Hypertension   . Migraine   . OSA (obstructive sleep apnea)   . Peripheral neuropathy   . Restless leg syndrome   . Rosacea   . Seborrheic dermatitis     Tobacco  History: Social History   Tobacco Use  Smoking Status Never Smoker  Smokeless Tobacco Never Used   Counseling given: Not Answered   Outpatient Medications Prior to Visit  Medication Sig Dispense Refill  . ACCU-CHEK GUIDE test strip     . acetaminophen (TYLENOL) 325 MG tablet Take 325 mg by mouth every 6 (six) hours as needed for mild pain or headache.    . allopurinol (ZYLOPRIM) 300 MG tablet Take 300 mg by mouth daily.    Marland Kitchen amLODipine (NORVASC) 5 MG tablet Take 10 mg by mouth daily.    Marland Kitchen atorvastatin (LIPITOR) 40 MG tablet Take 40 mg by mouth daily.    . diclofenac Sodium (VOLTAREN) 1 % GEL Apply 2 g topically 4 (four) times daily as needed (right toe pain).    . DULoxetine (CYMBALTA) 60 MG capsule Take 60 mg by mouth daily.    . furosemide (LASIX) 40 MG tablet Take by mouth.    . gabapentin (NEURONTIN) 800 MG tablet Take 1,600 mg by mouth 2 (two) times daily.     Marland Kitchen LORazepam (ATIVAN) 0.5 MG tablet Take 1 mg by mouth at bedtime.     Marland Kitchen losartan (COZAAR) 50 MG tablet Take 1 tablet (50 mg total) by mouth daily. 90 tablet 3  . nebivolol (BYSTOLIC) 5 MG tablet Take 1 tablet (5 mg total) by mouth daily. 90 tablet 3  . nilotinib (TASIGNA) 200 MG capsule Take 200 mg by mouth every 12 (twelve) hours. Give on an empty stomach 1 hour before or 2 hours after meals.    . polyvinyl alcohol (LIQUIFILM TEARS) 1.4 % ophthalmic solution Place 1 drop into both eyes as needed for dry eyes.    . Potassium Chloride ER 20 MEQ TBCR Take 20 mEq by mouth daily.    Marland Kitchen rOPINIRole (REQUIP) 1 MG tablet Take 3 mg by mouth at bedtime.     . SYMBICORT 160-4.5 MCG/ACT inhaler INHALE TWO PUFFS BY MOUTH TWICE A DAY (Patient taking differently: Inhale 2 puffs into the lungs 2 (two) times daily. ) 10.2 g 2  . tiZANidine (ZANAFLEX) 4 MG tablet Take by mouth.    . TRULICITY 1.30 QM/5.7QI SOPN Inject 0.75 mg into the skin once a week.     . furosemide (LASIX) 20 MG tablet Take 2 tablets (40 mg total) by mouth 2 (two) times  daily for 7 days. After 1 week please go back to your usual dose of 20 mg BID after following up with your primary care doctor 30 tablet 0   No facility-administered medications prior to visit.     Review of Systems:   Constitutional:   No  weight loss, night sweats,  Fevers, chills,  +fatigue, or  lassitude.  HEENT:   No headaches,  Difficulty swallowing,  Tooth/dental problems, or  Sore throat,                No sneezing, itching, ear ache, + nasal congestion, post nasal drip,   CV:  No chest pain,  Orthopnea, PND,  swelling in lower extremities, anasarca, dizziness, palpitations, syncope.   GI  No heartburn, indigestion, abdominal pain, nausea, vomiting, diarrhea, change in bowel habits, loss of appetite, bloody stools.   Resp:  .  No chest wall deformity  Skin: no rash or lesions.  GU: no dysuria, change in color of urine, no urgency or frequency.  No flank pain, no hematuria   MS:  No joint pain or swelling.  No decreased range of motion.  No back pain.    Physical Exam  BP 132/72 (BP Location: Left Arm, Cuff Size: Normal)   Pulse 76   Temp 97.7 F (36.5 C) (Oral)   Ht 5' (1.524 m)   Wt 209 lb 6.4 oz (95 kg)   SpO2 96%   BMI 40.90 kg/m   GEN: A/Ox3; pleasant , NAD, BMI 40    HEENT:  [place]/AT,  EACs-clear, TMs-wnl, NOSE-clear, THROAT-clear, no lesions, no postnasal drip or exudate noted.   NECK:  Supple w/ fair ROM; no JVD; normal carotid impulses w/o bruits; no thyromegaly or nodules palpated; no lymphadenopathy.    RESP  Clear  P & A; w/o, wheezes/ rales/ or rhonchi. no accessory muscle use, no dullness to percussion  CARD:  RRR, no m/r/g, 1-2+  peripheral edema, pulses intact, no cyanosis or clubbing.  GI:   Soft & nt; nml bowel sounds; no organomegaly or masses detected.   Musco: Warm bil, no deformities or joint swelling noted.   Neuro: alert, no focal deficits noted.    Skin: Warm, no lesions or rashes    Lab Results:  CBC    BNP  ProBNP No  results found for: PROBNP  Imaging: CT Angio Chest PE W and/or Wo Contrast  Result Date: 07/02/2020 CLINICAL DATA:  74 year old female with chest pain and shortness of breath. EXAM: CT ANGIOGRAPHY CHEST WITH CONTRAST TECHNIQUE: Multidetector CT imaging of the chest was performed using the standard protocol during bolus administration of intravenous contrast. Multiplanar CT image reconstructions and MIPs were obtained to evaluate the vascular anatomy. CONTRAST:  136mL OMNIPAQUE IOHEXOL 350 MG/ML SOLN COMPARISON:  Chest CT dated 06/23/2020. FINDINGS: Evaluation of this exam is limited due to respiratory motion artifact. Cardiovascular: There is mild cardiomegaly. No pericardial effusion. Coronary vascular calcification of the LAD. There is mild atherosclerotic calcification of the thoracic aorta. No aneurysmal dilatation or dissection. Evaluation of the pulmonary arteries is limited due to respiratory motion artifact. No pulmonary artery embolus identified. Mediastinum/Nodes: No hilar adenopathy. Mildly enlarged mediastinal lymph nodes measuring approximately 13 mm in short axis anterior to the carina. Overall interval increase in the size of the mediastinal lymph nodes compared to the CT of 06/23/2020. the esophagus is grossly unremarkable. No mediastinal fluid collection Lungs/Pleura: Small bilateral pleural effusions. There are bilateral lower lobe partial consolidative changes which may represent atelectasis or pneumonia. There is diffuse interstitial and interlobular septal prominence most likely edema. There is no pneumothorax. The central airways are patent. Upper Abdomen: Cirrhosis. Cholecystectomy. Linear calcification of the splenic capsule. Partially visualized small ascites. Musculoskeletal: Degenerative changes of the spine. No acute osseous pathology. Review of the MIP images confirms the above findings. IMPRESSION: 1. No CT evidence of pulmonary embolism. 2. Mild cardiomegaly with findings of CHF  and small bilateral pleural effusions. 3. Bilateral lower lobe atelectasis or pneumonia. Clinical correlation and follow-up to resolution recommended. 4. Mildly enlarged mediastinal lymph nodes, likely reactive. 5. Cirrhosis with small ascites. 6. Aortic Atherosclerosis (ICD10-I70.0). Electronically Signed   By: Laren Everts.D.  On: 07/02/2020 16:32   CT Angio Chest PE W/Cm &/Or Wo Cm  Result Date: 06/23/2020 CLINICAL DATA:  Shortness of breath EXAM: CT ANGIOGRAPHY CHEST WITH CONTRAST TECHNIQUE: Multidetector CT imaging of the chest was performed using the standard protocol during bolus administration of intravenous contrast. Multiplanar CT image reconstructions and MIPs were obtained to evaluate the vascular anatomy. CONTRAST:  78mL OMNIPAQUE IOHEXOL 350 MG/ML SOLN COMPARISON:  None. FINDINGS: Cardiovascular: There is a optimal opacification of the pulmonary arteries. There is no central,segmental, or subsegmental filling defects within the pulmonary arteries. There is mild cardiomegaly. No pericardial effusion or thickening. No evidence right heart strain. There is normal three-vessel brachiocephalic anatomy without proximal stenosis. Scattered aortic atherosclerosis is noted. Mediastinum/Nodes: No hilar, mediastinal, or axillary adenopathy. Thyroid gland, trachea, and esophagus demonstrate no significant findings. Lungs/Pleura: Mild bibasilar dependent streaky atelectasis is noted. No large airspace consolidation is seen. No pleural effusion or pneumothorax. No airspace consolidation. Upper Abdomen: No acute abnormalities present in the visualized portions of the upper abdomen. Calcifications within the spleen are seen. A small hiatal hernia is present. Musculoskeletal: No chest wall abnormality. No acute or significant osseous findings. Review of the MIP images confirms the above findings. IMPRESSION: No central, segmental, or subsegmental pulmonary embolism. No intrathoracic pathology to explain the  patient's symptoms. Aortic Atherosclerosis (ICD10-I70.0). Electronically Signed   By: Prudencio Pair M.D.   On: 06/23/2020 23:25   US Venous Img Lower Right (DVT Study)  Result Date: 06/23/2020 CLINICAL DATA:  Lower extremity edema EXAM: Right LOWER EXTREMITY VENOUS DOPPLER ULTRASOUND TECHNIQUE: Gray-scale sonography with compression, as well as color and duplex ultrasound, were performed to evaluate the deep venous system(s) from the level of the common femoral vein through the popliteal and proximal calf veins. COMPARISON:  None. FINDINGS: VENOUS Normal compressibility of the common femoral, superficial femoral, and popliteal veins, as well as the visualized calf veins. Visualized portions of profunda femoral vein and great saphenous vein unremarkable. No filling defects to suggest DVT on grayscale or color Doppler imaging. Doppler waveforms show normal direction of venous flow, normal respiratory plasticity and response to augmentation. Limited views of the contralateral common femoral vein are unremarkable. OTHER None. Limitations: none IMPRESSION: Negative. Electronically Signed   By: Prudencio Pair M.D.   On: 06/23/2020 21:09   DG Chest Port 1 View  Result Date: 07/05/2020 CLINICAL DATA:  Dyspnea. EXAM: PORTABLE CHEST 1 VIEW COMPARISON:  07/02/2020 FINDINGS: Lungs are adequately inflated with patchy density over the right midlung which is new. No evidence of effusion. Mild hazy prominence of the perihilar vessels. Borderline stable cardiomegaly. Remainder the exam is unchanged. IMPRESSION: 1. New patchy density over the right midlung which may be due to atelectasis or infection. 2.  Suggestion of mild vascular congestion. Electronically Signed   By: Marin Olp M.D.   On: 07/05/2020 08:00   DG Chest Port 1 View  Result Date: 07/02/2020 CLINICAL DATA:  Shortness of breath EXAM: PORTABLE CHEST 1 VIEW COMPARISON:  June 23, 2020 chest radiograph and CT angiogram chest FINDINGS: Lungs are clear. Heart is  borderline enlarged with pulmonary vascularity normal. No adenopathy. There is aortic atherosclerosis. No bone lesions. IMPRESSION: Heart borderline enlarged.  Lungs clear. Aortic Atherosclerosis (ICD10-I70.0). Electronically Signed   By: Lowella Grip III M.D.   On: 07/02/2020 12:35   DG Chest Portable 1 View  Result Date: 06/23/2020 CLINICAL DATA:  Shortness of breath and subjective fevers with altered sense of taste. EXAM: PORTABLE CHEST 1 VIEW COMPARISON:  10/28/2019 FINDINGS: Trachea is midline. Cardiomediastinal contours stable enlarged accounting for AP projection and portable technique. Hilar structures with suggestion of central vascular engorgement. No frank edema. No consolidation. No sign of pleural effusion. Skeletal structures on limited assessment without acute process. IMPRESSION: Cardiomegaly with central vascular engorgement. No frank edema or consolidation. Electronically Signed   By: Zetta Bills M.D.   On: 06/23/2020 14:13   ECHOCARDIOGRAM COMPLETE  Result Date: 07/03/2020    ECHOCARDIOGRAM REPORT   Patient Name:   ELLENORA TALTON Date of Exam: 07/03/2020 Medical Rec #:  [id number]        Height:       60.0 in Accession #:    [id number]       Weight:       218.3 lb Date of Birth:  [birth year]        BSA:          1.937 m Patient Age:    61 years         BP:           146/63 mmHg Patient Gender: F                HR:           74 bpm. Exam Location:  Inpatient Procedure: 2D Echo Indications:    Cardiomyopathy-Unspecified 425.9 / I42.9  History:        Patient has prior history of Echocardiogram examinations, most                 recent 10/18/2019. Risk Factors:Sleep Apnea, Hypertension,                 Dyslipidemia and Diabetes. GERD. Fatty liver.  Sonographer:    Darlina Sicilian RDCS Referring Phys: [id number] ANKIT CHIRAG AMIN IMPRESSIONS  1. Left ventricular ejection fraction, by estimation, is 60 to 65%. The left ventricle has normal function. The left ventricle has no regional wall  motion abnormalities. Left ventricular diastolic parameters are consistent with Grade II diastolic dysfunction (pseudonormalization). Elevated left atrial pressure.  2. Right ventricular systolic function is normal. The right ventricular size is mildly enlarged. Tricuspid regurgitation signal is inadequate for assessing PA pressure.  3. The mitral valve is normal in structure. No evidence of mitral valve regurgitation. No evidence of mitral stenosis.  4. The aortic valve is grossly normal. Aortic valve regurgitation is not visualized. No aortic stenosis is present.  5. The inferior vena cava is dilated in size with <50% respiratory variability, suggesting right atrial pressure of 15 mmHg. Comparison(s): Prior images reviewed side by side. The left ventricular systolic function is unchanged. The left ventricular diastolic function is significantly worse. Both right and left heart filling pressures are now elevated. FINDINGS  Left Ventricle: Left ventricular ejection fraction, by estimation, is 60 to 65%. The left ventricle has normal function. The left ventricle has no regional wall motion abnormalities. The left ventricular internal cavity size was normal in size. There is  no left ventricular hypertrophy. Left ventricular diastolic parameters are consistent with Grade II diastolic dysfunction (pseudonormalization). Elevated left atrial pressure. Right Ventricle: The right ventricular size is mildly enlarged. No increase in right ventricular wall thickness. Right ventricular systolic function is normal. Tricuspid regurgitation signal is inadequate for assessing PA pressure. Left Atrium: Left atrial size was normal in size. Right Atrium: Right atrial size was normal in size. Pericardium: There is no evidence of pericardial effusion. Mitral Valve: The mitral valve is normal in structure. Normal mobility of the  mitral valve leaflets. No evidence of mitral valve regurgitation. No evidence of mitral valve stenosis.  Tricuspid Valve: The tricuspid valve is normal in structure. Tricuspid valve regurgitation is not demonstrated. No evidence of tricuspid stenosis. Aortic Valve: The aortic valve is grossly normal. Aortic valve regurgitation is not visualized. No aortic stenosis is present. Pulmonic Valve: The pulmonic valve was grossly normal. Pulmonic valve regurgitation is not visualized. No evidence of pulmonic stenosis. Aorta: The aortic root is normal in size and structure. Venous: The inferior vena cava is dilated in size with less than 50% respiratory variability, suggesting right atrial pressure of 15 mmHg. IAS/Shunts: The atrial septum is grossly normal.  LEFT VENTRICLE PLAX 2D LVIDd:         4.60 cm  Diastology LVIDs:         3.30 cm  LV e' lateral:   5.44 cm/s LV PW:         1.00 cm  LV E/e' lateral: 24.4 LV IVS:        1.10 cm  LV e' medial:    5.33 cm/s LVOT diam:     1.80 cm  LV E/e' medial:  25.0 LV SV:         84 LV SV Index:   43 LVOT Area:     2.54 cm  RIGHT VENTRICLE RV S prime:     15.90 cm/s TAPSE (M-mode): 2.8 cm LEFT ATRIUM             Index       RIGHT ATRIUM           Index LA diam:        3.20 cm 1.65 cm/m  RA Area:     17.10 cm LA Vol (A2C):   43.2 ml 22.30 ml/m RA Volume:   44.90 ml  23.18 ml/m LA Vol (A4C):   54.9 ml 28.34 ml/m LA Biplane Vol: 50.6 ml 26.12 ml/m  AORTIC VALVE LVOT Vmax:   150.00 cm/s LVOT Vmean:  104.000 cm/s LVOT VTI:    0.330 m  AORTA Ao Root diam: 3.30 cm MITRAL VALVE MV Area (PHT): 4.49 cm     SHUNTS MV Decel Time: 169 msec     Systemic VTI:  0.33 m MV E velocity: 133.00 cm/s  Systemic Diam: 1.80 cm MV A velocity: 115.00 cm/s MV E/A ratio:  1.16 Mihai Croitoru MD Electronically signed by Sanda Klein MD Signature Date/Time: 07/03/2020/1:26:51 PM    Final       PFT Results Latest Ref Rng & Units 12/27/2019  FVC-Pre L 1.83  FVC-Predicted Pre % 73  FVC-Post L 1.82  FVC-Predicted Post % 73  Pre FEV1/FVC % % 82  Post FEV1/FCV % % 88  FEV1-Pre L 1.51  FEV1-Predicted Pre  % 80  FEV1-Post L 1.60  DLCO uncorrected ml/min/mmHg 18.62  DLCO UNC% % 110  DLVA Predicted % 141  TLC L 4.43  TLC % Predicted % 99  RV % Predicted % 105    No results found for: NITRICOXIDE      Assessment & Plan:   OSA on CPAP Excellent control compliance on nocturnal CPAP no changes  Plan  Patient Instructions  Continue Symibcort 2 puffs Twice daily  , rinse after use.  Low salt diet  Keep legs elevated.  Continue follow up with \\Cardiology .  Continue on CPAP At bedtime   ProAir Inhaler 1-2 puffs every 4hrs as needed As needed   Follow up with Dr. Halford Chessman as planned with  chest xray .  Please contact office for sooner follow up if symptoms do not improve or worsen or seek emergency care       Asthma Appears to be under control.  Continue on Symbicort.   Diastolic dysfunction Chronic diastolic heart failure.  Recent hospitalization due to decompensated with volume overload.  Patient is improved after diuresis.  She is following with cardiology and has an upcoming cardiac stress test.  Continue on current regimen along with low-salt diet.  We will check chest x-ray on return visit.     Wanda Edison, NP 07/17/2020

## 2020-07-17 NOTE — Assessment & Plan Note (Signed)
Appears to be under control.  Continue on Symbicort.

## 2020-07-18 NOTE — Progress Notes (Signed)
Reviewed and agree with assessment/plan.   Chesley Mires, MD [facility] Pulmonary/Critical Care 07/18/2020, 9:03 AM Pager:  [phone]

## 2020-08-01 ENCOUNTER — Other Ambulatory Visit: Payer: Self-pay

## 2020-08-01 ENCOUNTER — Encounter: Payer: Self-pay | Admitting: Pulmonary Disease

## 2020-08-01 ENCOUNTER — Other Ambulatory Visit: Payer: Self-pay | Admitting: Pulmonary Disease

## 2020-08-01 ENCOUNTER — Other Ambulatory Visit: Payer: Self-pay | Admitting: Adult Health

## 2020-08-01 ENCOUNTER — Ambulatory Visit (INDEPENDENT_AMBULATORY_CARE_PROVIDER_SITE_OTHER): Payer: Medicare Other

## 2020-08-01 ENCOUNTER — Ambulatory Visit: Payer: Medicare Other | Admitting: Pulmonary Disease

## 2020-08-01 VITALS — BP 120/70 | HR 80 | Temp 97.5°F | Ht 60.0 in | Wt 203.0 lb

## 2020-08-01 DIAGNOSIS — R509 Fever, unspecified: Secondary | ICD-10-CM

## 2020-08-01 DIAGNOSIS — J4530 Mild persistent asthma, uncomplicated: Secondary | ICD-10-CM

## 2020-08-01 DIAGNOSIS — Z9989 Dependence on other enabling machines and devices: Secondary | ICD-10-CM

## 2020-08-01 DIAGNOSIS — G4733 Obstructive sleep apnea (adult) (pediatric): Secondary | ICD-10-CM

## 2020-08-01 DIAGNOSIS — R0789 Other chest pain: Secondary | ICD-10-CM

## 2020-08-01 MED ORDER — ALBUTEROL SULFATE HFA 108 (90 BASE) MCG/ACT IN AERS: 2.0000 | INHALATION_SPRAY | Freq: Four times a day (QID) | RESPIRATORY_TRACT | 6 refills | Status: AC | PRN

## 2020-08-01 NOTE — Progress Notes (Signed)
[facility]  Chief Complaint  Patient presents with  . Follow-up    pt is here for cpap compliance.pt is dx with chf after hosptial visit on aug 13th    Constitutional:  BP 120/70 (BP Location: Left Arm, Cuff Size: Normal)   Pulse 80   Temp (!) 97.5 F (36.4 C) (Oral)   Ht 5' (1.524 m)   Wt 203 lb (92.1 kg)   SpO2 98%   BMI 39.65 kg/m   Past Medical History:  Back pain, CML 2015, Depression, DM, Eczema, Fatty liver, GERD, Gout, HLD, HTN, Migraine HA, Peripheral neuropathy, RLS, Rosacea  Past Surgical History:  Her  has a past surgical history that includes Back surgery; Abdominal hysterectomy; and Joint replacement.  Brief Summary:  Wanda Hanson is a 74 y.o. female with obstructive sleep apnea.She had viral pneumonia from [facility] virus in December 2018.      Subjective:   She was in hospital recently for CHF exacerbation.  Feeling better now.  Has stress test scheduled with cardiology.  Not having cough, wheeze, sputum, or chest congestion.  Uses CPAP nightly w/o difficulty.  Uses a walker to help with her balance.  Physical Exam:   Appearance - well kempt   ENMT - no sinus tenderness, no oral exudate, no LAN, Mallampati 3 airway, no stridor  Respiratory - equal breath sounds bilaterally, no wheezing or rales  CV - s1s2 regular rate and rhythm, no murmurs  Ext - no clubbing, no edema  Skin - no rashes  Psych - normal mood and affect   Pulmonary testing:   PFT 12/27/19 >> FEV1 1.60 (85%), FEV1% 88, TLC 4.43 (99%), DLCO 110%  Chest Imaging:   CT angio chest 10/03/19 >> pulmonary mosaicism, cirrhosis  Sleep Tests:   HST 01/31/18 >> AHI 7.4, SaO2 low 80%.  Auto CPAP 07/01/20 to 07/30/20 >> used on 26 of 30 nights with average 8 hrs 46 min.  Average AHI 1.1 with median CPAP 9 and 95 th percentile CPAP 11 cm H2O  Cardiac Tests:   Echo 07/03/20 >> EF 60 to 65%, grade 2 DD  Social History:  She  reports  that she has never smoked. She has never used smokeless tobacco. She reports that she does not drink alcohol and does not use drugs.  Family History:  Her family history includes Lung cancer in her father.      Assessment/Plan:   Mild, persistent asthma. - continue symbicort - will arrange for prn albuterol inhaler  Obstructive sleep apnea. - she is compliant with CPAP - uses [employer] for her DME - continue auto CPAP 5 to 15 cm H2O  Coronary calcification, chronic diastolic CHF. - followed by Dr. Elonda Husky with cardiology at [facility] in remission. - followed by Dr. Florene Glen with oncology at [facility]  Time Spent Involved in Patient Care on Day of Examination:  36 minutes  Follow up:  Patient Instructions  Albuterol two puffs every 6 hours as needed for cough, wheezing, or chest congestion  Follow up in 6 months   Medication List:   Allergies as of 08/01/2020      Reactions   Benzalkonium Chloride Dermatitis, Swelling   At The Site of Application At The Site of Application   Cortisone Swelling   Neomycin-bacitracin-polymyxin [bacitracin-neomycin-polymyxin] Swelling   Pollen Extract Other (See Comments)   Typical "Hay Fever" symptoms Stuffy nose   Ace Inhibitors Swelling   Asa [aspirin]    Ulcer  Latex    Morphine And Related    SOB   Neosporin [bacitracin-polymyxin B]    Swelling where applied       Medication List       Accurate as of August 01, 2020  1:02 PM. If you have any questions, ask your nurse or doctor.        Accu-Chek Guide test strip Generic drug: glucose blood   acetaminophen 325 MG tablet Commonly known as: TYLENOL Take 325 mg by mouth every 6 (six) hours as needed for mild pain or headache.   albuterol 108 (90 Base) MCG/ACT inhaler Commonly known as: VENTOLIN HFA Inhale 2 puffs into the lungs every 6 (six) hours as needed for wheezing or shortness of breath. Started by: Chesley Mires, MD   allopurinol 300 MG tablet Commonly known as:  ZYLOPRIM Take 300 mg by mouth daily.   amLODipine 5 MG tablet Commonly known as: NORVASC Take 10 mg by mouth daily.   atorvastatin 40 MG tablet Commonly known as: LIPITOR Take 40 mg by mouth daily.   diclofenac Sodium 1 % Gel Commonly known as: VOLTAREN Apply 2 g topically 4 (four) times daily as needed (right toe pain).   DULoxetine 60 MG capsule Commonly known as: CYMBALTA Take 60 mg by mouth daily.   furosemide 40 MG tablet Commonly known as: LASIX Take by mouth. Pt takes 40mg  in am and 20 mg in afternno   gabapentin 800 MG tablet Commonly known as: NEURONTIN Take 1,600 mg by mouth 2 (two) times daily.   LORazepam 0.5 MG tablet Commonly known as: ATIVAN Take 1 mg by mouth at bedtime.   losartan 50 MG tablet Commonly known as: COZAAR Take 1 tablet (50 mg total) by mouth daily.   metolazone 2.5 MG tablet Commonly known as: ZAROXOLYN Take by mouth.   nebivolol 5 MG tablet Commonly known as: Bystolic Take 1 tablet (5 mg total) by mouth daily.   nilotinib 200 MG capsule Commonly known as: TASIGNA Take 200 mg by mouth every 12 (twelve) hours. Give on an empty stomach 1 hour before or 2 hours after meals.   polyvinyl alcohol 1.4 % ophthalmic solution Commonly known as: LIQUIFILM TEARS Place 1 drop into both eyes as needed for dry eyes.   Potassium Chloride ER 20 MEQ Tbcr Take 20 mEq by mouth daily.   rOPINIRole 1 MG tablet Commonly known as: REQUIP Take 3 mg by mouth at bedtime.   Symbicort 160-4.5 MCG/ACT inhaler Generic drug: budesonide-formoterol INHALE TWO PUFFS BY MOUTH TWICE A DAY What changed: See the new instructions.   tiZANidine 4 MG tablet Commonly known as: ZANAFLEX Take by mouth.   Trulicity 0.99 IP/3.8SN Sopn Generic drug: Dulaglutide Inject 0.75 mg into the skin once a week.       Signature:  Chesley Mires, MD [place] Pager - [phone] 08/01/2020, 1:02 PM

## 2020-08-01 NOTE — Patient Instructions (Signed)
Albuterol two puffs every 6 hours as needed for cough, wheezing, or chest congestion  Follow up in 6 months

## 2020-08-09 ENCOUNTER — Telehealth: Payer: Self-pay | Admitting: Pulmonary Disease

## 2020-08-17 NOTE — Telephone Encounter (Signed)
Entered in error

## 2020-10-25 ENCOUNTER — Other Ambulatory Visit: Payer: Self-pay | Admitting: Adult Health

## 2021-12-10 IMAGING — CT CT ANGIO CHEST
2 of 8 series · 18 of 36 positions shown · IV contrast (Omnipaque)
Comparison: None.

CLINICAL DATA: Shortness of breath

EXAM:
CT ANGIOGRAPHY CHEST WITH CONTRAST
TECHNIQUE: Multidetector CT imaging of the chest was performed using the
standard protocol during bolus administration of intravenous
contrast. Multiplanar CT image reconstructions and MIPs were
obtained to evaluate the vascular anatomy.
CONTRAST:  80mL OMNIPAQUE IOHEXOL 350 MG/ML SOLN

[Series 6: pe coronal mpr · coronal · 0.59mm/px · 1 of 151 slices shown]
[im 76/151  mediastinal]
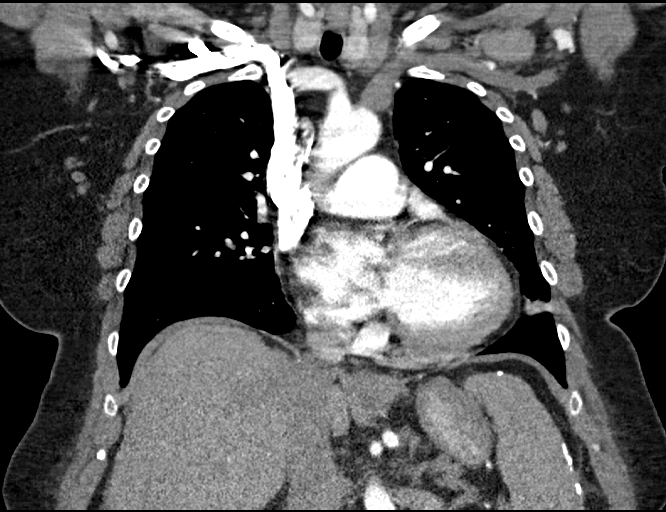

[Series 10: pe thins · axial · 0.73mm/px · z∈[-17,+249]mm · 17 of 298 slices shown]
[im 16/298  lung]
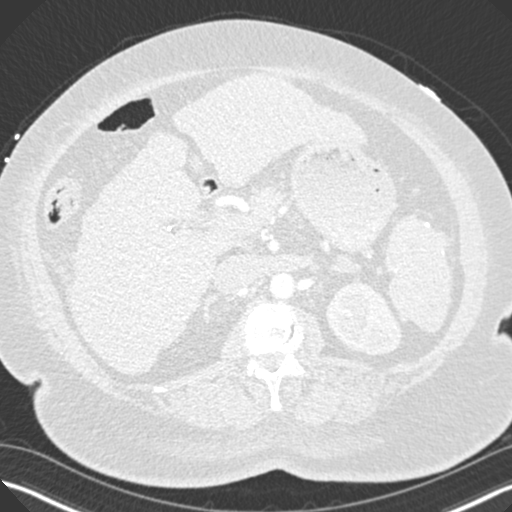
[im 32/298  mediastinal]
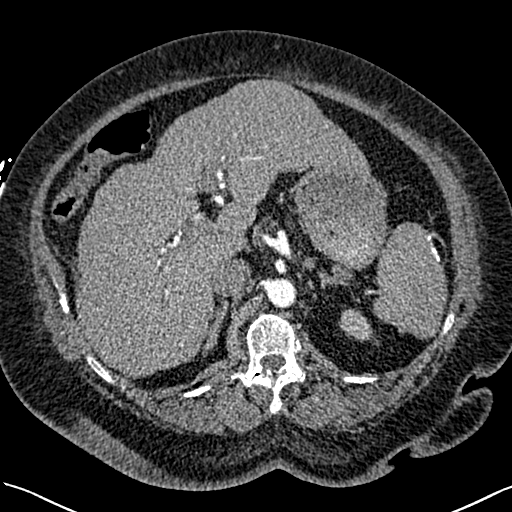
[im 47/298  lung]
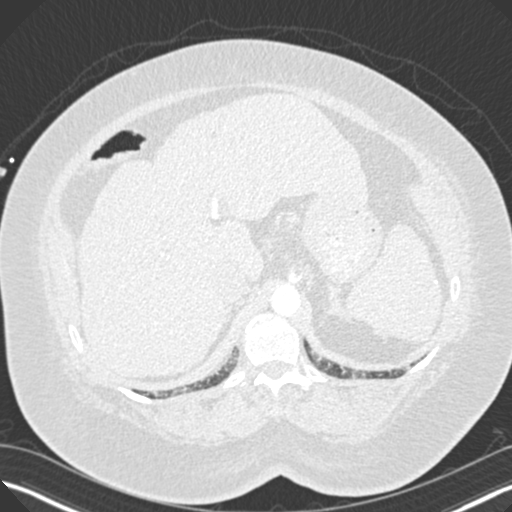
[im 63/298  mediastinal]
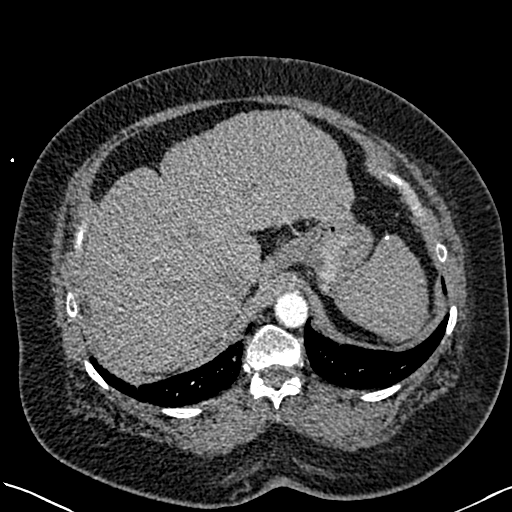
[im 79/298  lung]
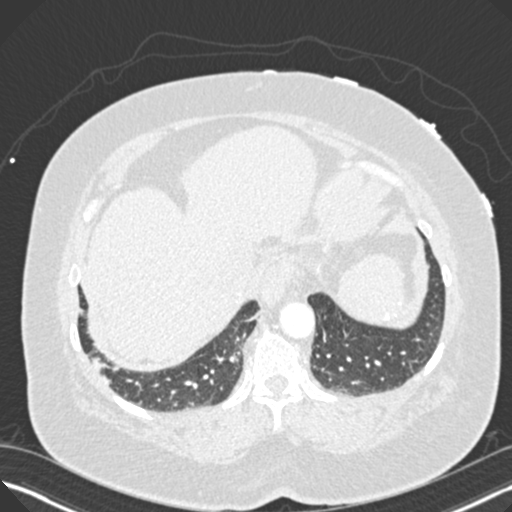
[im 94/298  mediastinal]
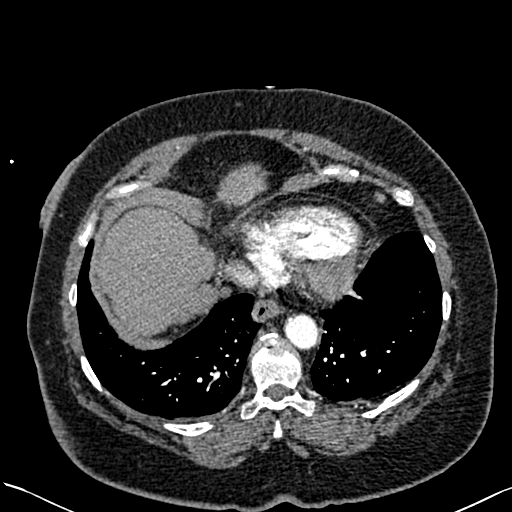
[im 110/298  lung]
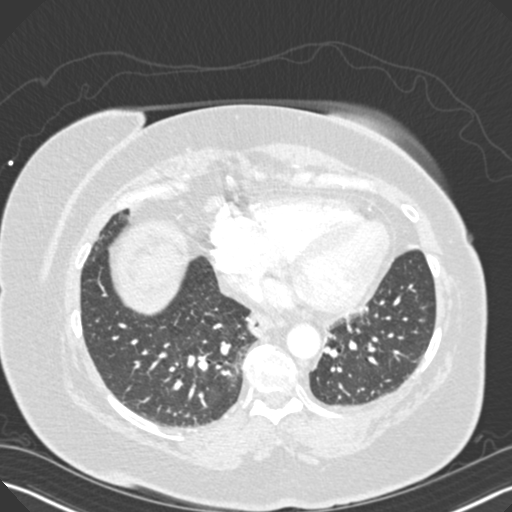
[im 126/298  mediastinal]
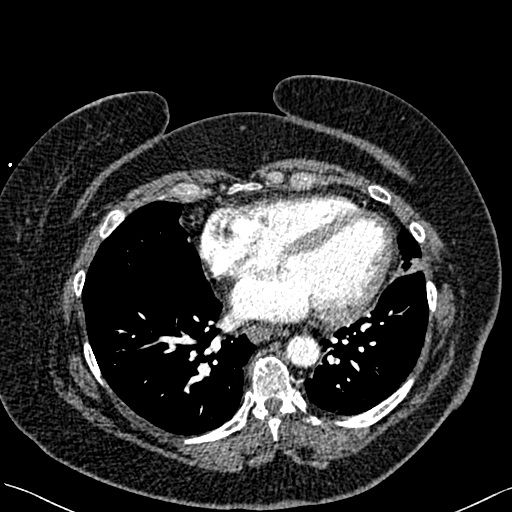
[im 157/298  lung]
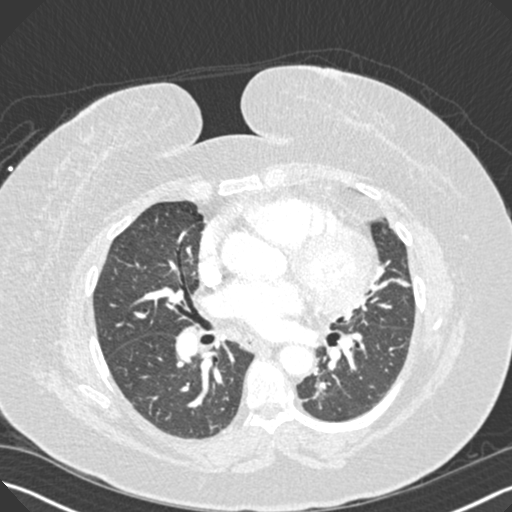
[im 172/298  mediastinal]
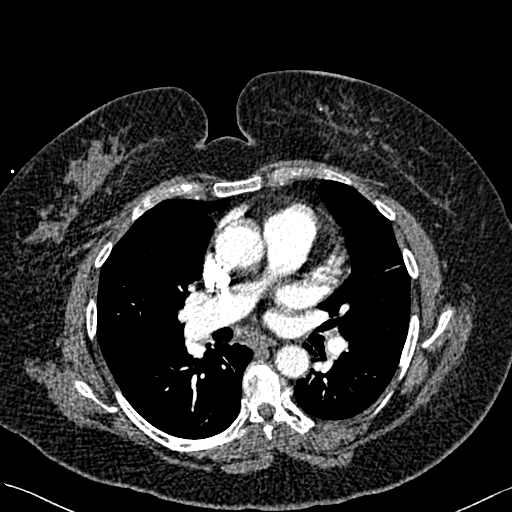
[im 188/298  lung]
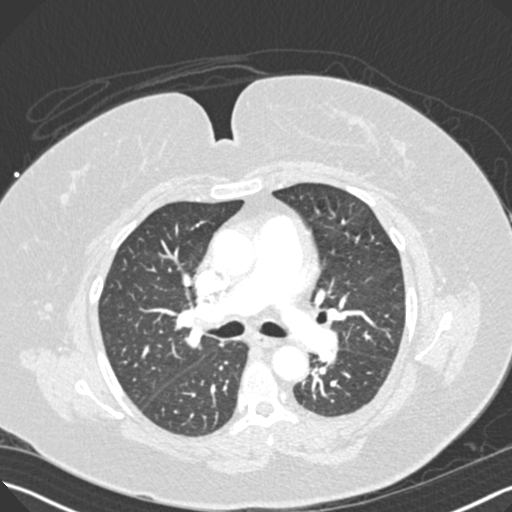
[im 204/298  mediastinal]
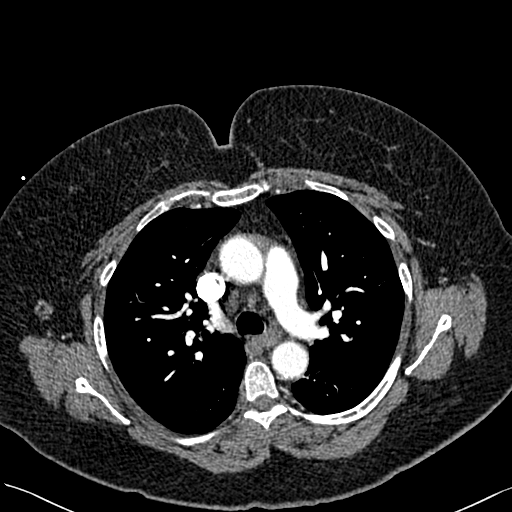
[im 219/298  lung]
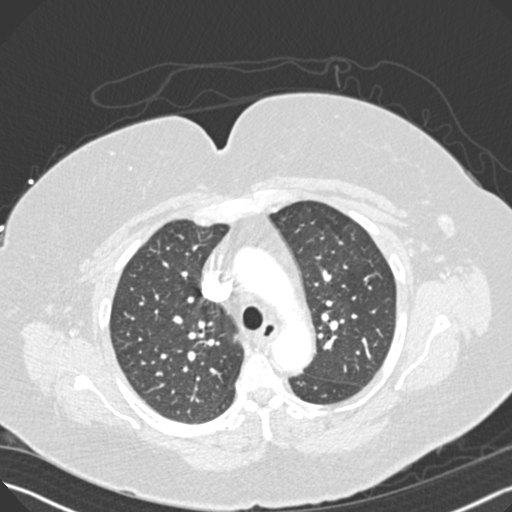
[im 235/298  mediastinal]
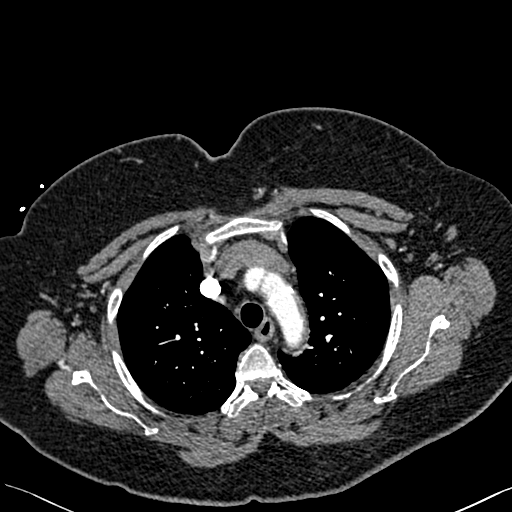
[im 251/298  lung]
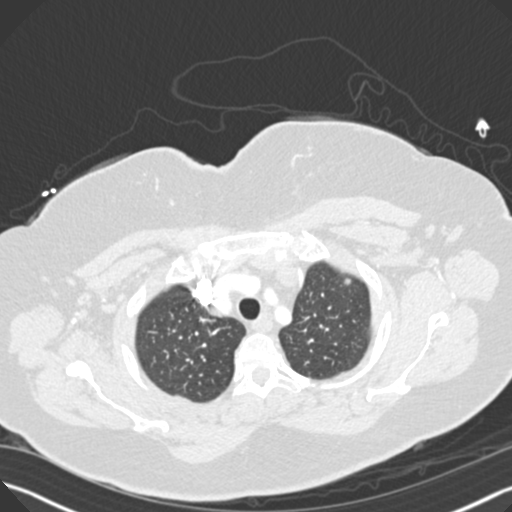
[im 266/298  mediastinal]
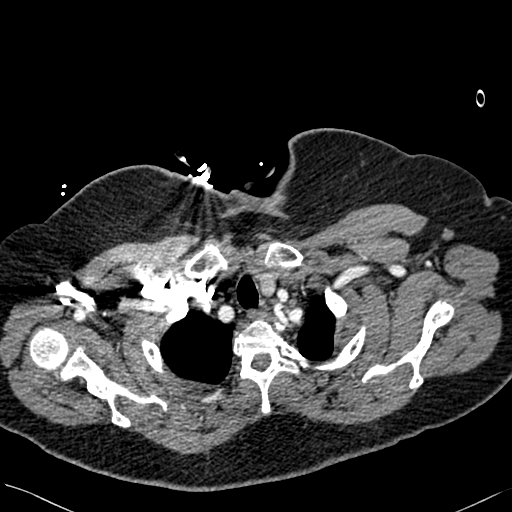
[im 282/298  lung]
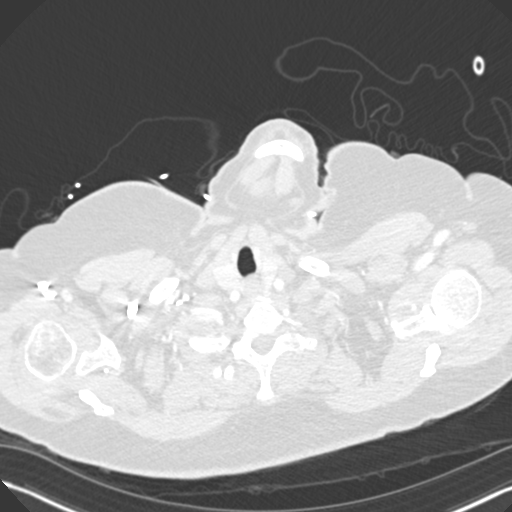

[18 of 36 positions shown; findings below may reference images not displayed]

FINDINGS: Cardiovascular: There is a optimal opacification of the pulmonary
arteries. There is no central,segmental, or subsegmental filling
defects within the pulmonary arteries. There is mild cardiomegaly.
No pericardial effusion or thickening. No evidence right heart
strain. There is normal three-vessel brachiocephalic anatomy without
proximal stenosis. Scattered aortic atherosclerosis is noted.

Mediastinum/Nodes: No hilar, mediastinal, or axillary adenopathy.
Thyroid gland, trachea, and esophagus demonstrate no significant
findings.

Lungs/Pleura: Mild bibasilar dependent streaky atelectasis is noted.
No large airspace consolidation is seen. No pleural effusion or
pneumothorax. No airspace consolidation.

Upper Abdomen: No acute abnormalities present in the visualized
portions of the upper abdomen. Calcifications within the spleen are
seen. A small hiatal hernia is present.

Musculoskeletal: No chest wall abnormality. No acute or significant
osseous findings.

Review of the MIP images confirms the above findings.
IMPRESSION: No central, segmental, or subsegmental pulmonary embolism.

No intrathoracic pathology to explain the patient's symptoms.

Aortic Atherosclerosis ([id number]).

## 2022-01-02 ENCOUNTER — Encounter: Payer: Self-pay | Admitting: Neurology

## 2022-01-02 ENCOUNTER — Ambulatory Visit: Payer: Medicare Other | Admitting: Neurology

## 2022-01-02 VITALS — BP 144/82 | HR 77 | Ht 60.0 in | Wt 198.5 lb

## 2022-01-02 DIAGNOSIS — R519 Headache, unspecified: Secondary | ICD-10-CM

## 2022-01-02 DIAGNOSIS — R440 Auditory hallucinations: Secondary | ICD-10-CM

## 2022-01-02 DIAGNOSIS — R7982 Elevated C-reactive protein (CRP): Secondary | ICD-10-CM

## 2022-01-02 DIAGNOSIS — R700 Elevated erythrocyte sedimentation rate: Secondary | ICD-10-CM | POA: Diagnosis not present

## 2022-01-02 DIAGNOSIS — M791 Myalgia, unspecified site: Secondary | ICD-10-CM

## 2022-01-02 DIAGNOSIS — R413 Other amnesia: Secondary | ICD-10-CM

## 2022-01-02 NOTE — Progress Notes (Signed)
[facility]  PATIENT: Wanda Hanson DOB: 03-28-1946  REFERRING DOCTOR OR PCP: Zack Seal, MD SOURCE: Patient, notes from primary care, imaging and laboratory reports, MRI images personally reviewed.  _________________________________   HISTORICAL  CHIEF COMPLAINT:  Chief Complaint  Patient presents with   New Patient (Initial Visit)    Rm 2, w husband and daughter. Paper referral from Dr. Zack Seal for paranoia/hallucination and balance difficulty. Pt has been having r side head pain, shoots to necks, shoulder and hands. Dx w fibromyalgia. Pt reports having balance issues for years. Ambulates with rolling walker. Family mentions pt is hearing ppl coming into the house. Pt is not visualizing but more so hearing ppl.     HISTORY OF PRESENT ILLNESS:  I had the pleasure to see your patient, Wanda Hanson, at [facility] for neurologic consultation regarding her visual hallucinations, memory loss, myalgias and headaches.  She is a 76 year old woman who has had  auditory hallucinations over the last couple months.   The first time, she flet omeone was coming into the house..   She often feels someone is in the house.   She hears voices and also smells perfume from the one 'woman in the house'.  She feels that they are using her computer and searching her room.  She has locked up her computer.  .   The voices are sometimes making fun of her and laughing.   She hears crickets frequently on the right.   When she looks around she only has rarely seen someone.  The voices are mostly in the evenings and nights.    She denies difficuly with memory though her family feels she has mild memory issues    She has had HA in the temporal regoin.  She has a h/o mograine.  She had one sever atypical headach with righ temple pain that worsened when she tried to open her jaw.      She has aching all over and has had ths x several years bt much worse the  last few months.     She has RLS and s on gabapentin, tramadol and ropinirole for these issues.    She also takes lorazepam at bedtme.     She has a some depression and is on Cymbalta x years.  Wellbutrin was just added.       She had CML and was on Tasigna for a while.    Blood counts under control x many years.      DATA: IMAGING personally reviewed MRI of the brain 12/25/2021 was normal for age --- minimal atrophy and minimal SVID.  LABS 12/12/2021:  CK slightly elevated at 248 (< 223); ESR elevated at 49 (<30); CRP elevated at 5.5 (<5); BUN/Crt 40 / 2.2; K = 3.4;  CBC ok; Vit D low at 10.    ESR was 14 07/2021, 42 11/27/2020. 88 10/02/20  Her daughter has bipolar disease and MS.    REVIEW OF SYSTEMS: Constitutional: No fevers, chills, sweats, or change in appetite Eyes: No visual changes, double vision, eye pain Ear, nose and throat: No hearing loss, ear pain, nasal congestion, sore throat Cardiovascular: No chest pain, palpitations Respiratory:  No shortness of breath at rest or with exertion.   No wheezes GastrointestinaI: No nausea, vomiting, diarrhea, abdominal pain, fecal incontinence Genitourinary:  No dysuria, urinary retention or frequency.  No nocturia. Musculoskeletal:  No neck pain, back pain Integumentary: No rash, pruritus, skin lesions Neurological: as above  Psychiatric: No depression at this time.  No anxiety Endocrine: No palpitations, diaphoresis, change in appetite, change in weigh or increased thirst Hematologic/Lymphatic:  No anemia, purpura, petechiae. Allergic/Immunologic: No itchy/runny eyes, nasal congestion, recent allergic reactions, rashes  ALLERGIES: Allergies  Allergen Reactions   Benzalkonium Chloride Dermatitis and Swelling    At The Site of Application At The Site of Application    Cortisone Swelling   Neomycin-Bacitracin-Polymyxin [Bacitracin-Neomycin-Polymyxin] Swelling   Pollen Extract Other (See Comments)    Typical "Hay Fever"  symptoms Stuffy nose   Ace Inhibitors Swelling   Asa [Aspirin]     Ulcer    Latex    Morphine And Related     SOB    Neosporin [Bacitracin-Polymyxin B]     Swelling where applied     HOME MEDICATIONS:  Current Outpatient Medications:    ACCU-CHEK GUIDE test strip, , Disp: , Rfl:    acetaminophen (TYLENOL) 325 MG tablet, Take 325 mg by mouth every 6 (six) hours as needed for mild pain or headache., Disp: , Rfl:    albuterol (VENTOLIN HFA) 108 (90 Base) MCG/ACT inhaler, Inhale 2 puffs into the lungs every 6 (six) hours as needed for wheezing or shortness of breath., Disp: 8 g, Rfl: 6   allopurinol (ZYLOPRIM) 300 MG tablet, Take 300 mg by mouth daily., Disp: , Rfl:    buPROPion ER (WELLBUTRIN SR) 100 MG 12 hr tablet, Take 100 mg by mouth every morning., Disp: , Rfl:    diclofenac Sodium (VOLTAREN) 1 % GEL, Apply 2 g topically 4 (four) times daily as needed (right toe pain)., Disp: , Rfl:    DULoxetine (CYMBALTA) 60 MG capsule, Take 60 mg by mouth daily., Disp: , Rfl:    furosemide (LASIX) 40 MG tablet, Take by mouth. Pt takes 18m in am and 20 mg in afternno, Disp: , Rfl:    gabapentin (NEURONTIN) 800 MG tablet, Take 1,600 mg by mouth 2 (two) times daily. , Disp: , Rfl:    LORazepam (ATIVAN) 0.5 MG tablet, Take 1 mg by mouth at bedtime., Disp: , Rfl:    losartan (COZAAR) 50 MG tablet, Take 1 tablet (50 mg total) by mouth daily., Disp: 90 tablet, Rfl: 3   metolazone (ZAROXOLYN) 2.5 MG tablet, Take by mouth., Disp: , Rfl:    nebivolol (BYSTOLIC) 5 MG tablet, Take 1 tablet (5 mg total) by mouth daily., Disp: 90 tablet, Rfl: 3   nilotinib (TASIGNA) 200 MG capsule, Take 200 mg by mouth every 12 (twelve) hours. Give on an empty stomach 1 hour before or 2 hours after meals., Disp: , Rfl:    polyvinyl alcohol (LIQUIFILM TEARS) 1.4 % ophthalmic solution, Place 1 drop into both eyes as needed for dry eyes., Disp: , Rfl:    Potassium Chloride ER 20 MEQ TBCR, Take 20 mEq by mouth daily., Disp: ,  Rfl:    rOPINIRole (REQUIP) 1 MG tablet, Take 3 mg by mouth at bedtime. 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[place]) ([place]  Depression    Diabetes mellitus ([place])    Eczema    Fatty liver    GERD (gastroesophageal reflux disease)    Gout    Hyperlipidemia    Hypertension    Migraine    OSA (obstructive sleep apnea)    Peripheral neuropathy    Restless leg syndrome    Rosacea    Seborrheic dermatitis     PAST SURGICAL HISTORY: Past Surgical History:  Procedure Laterality Date   ABDOMINAL HYSTERECTOMY     BACK SURGERY     JOINT REPLACEMENT      FAMILY HISTORY: Family History  Problem Relation Age of Onset   High blood pressure Mother    Alzheimer's disease Mother    Dementia Mother    Lung cancer Father    High Cholesterol Father    Breast cancer Sister     SOCIAL HISTORY:  Social History   Socioeconomic History   Marital status: Married    Spouse name: Hoy Morn   Number of children: 3   Years of education: Not on file   Highest education level: High school graduate  Occupational History   Not on file  Tobacco Use   Smoking status: Never   Smokeless tobacco: Never  Vaping Use   Vaping Use: Never used  Substance and Sexual Activity   Alcohol use: No   Drug use: No   Sexual activity: Not on file  Other Topics Concern   Not on file  Social History Narrative   Lives at home with husband and son   Right handed   Caffeine: 1 cup of coffee in the AM   Social Determinants of Health   Financial Resource Strain: Not on file  Food Insecurity: Not on file   Transportation Needs: Not on file  Physical Activity: Not on file  Stress: Not on file  Social Connections: Not on file  Intimate Partner Violence: Not on file     PHYSICAL EXAM  Vitals:   01/02/22 1010  BP: (!) 144/82  Pulse: 77  Weight: 198 lb 8 oz (90 kg)  Height: 5' (1.524 m)    Body mass index is 38.77 kg/m.   General: The patient is well-developed and well-nourished and in no acute distress  HEENT:  Head is [place]/AT.  Sclera are anicteric.  Funduscopic exam shows normal optic discs and retinal vessels.  Neck: No carotid bruits are noted.  The neck is nontender.  Cardiovascular: The heart has a regular rate and rhythm with a normal S1 and S2. There were no murmurs, gallops or rubs.    Skin: Extremities are without rash or  edema.  Musculoskeletal:  Back is nontender  Neurologic Exam  Mental status: The patient is alert and oriented x 3 at the time of the examination. The patient has apparent normal recent and remote memory, with mildly reduced focus/attention.  Marland Kitchen   Speech is normal.  Cranial nerves: Extraocular movements are full. Pupils are equal, round, and reactive to light and accomodation.  Visual fields are full.  Facial symmetry is present. There is good facial sensation to soft touch bilaterally.Facial strength is normal.  Trapezius and sternocleidomastoid strength is normal. No dysarthria is noted.  The tongue is midline, and the patient has symmetric elevation of the soft palate. No obvious hearing deficits are noted.  Motor:  Muscle bulk is normal.   Tone is normal. Strength is  4+/5 in shoulders and iliopsoas and  5 / 5 elsewhere in limbs  Sensory: Sensory  testing is intact to pinprick, soft touch and vibration sensation in all 4 extremities.  Coordination: Cerebellar testing reveals good finger-nose-finger and heel-to-shin bilaterally.  Gait and station: Station is normal.   She can stand without support but is unstable with gait and uses walker.    Stride is reduced.   Romberg is negative.   Reflexes: Deep tendon reflexes are 1 and symmetric in arms and knees and trace at ankles.   Plantar responses are flexor.    DIAGNOSTIC DATA (LABS, IMAGING, TESTING) - I reviewed patient records, labs, notes, testing and imaging myself where available.  Lab Results  Component Value Date   WBC 9.3 07/06/2020   HGB 11.5 (L) 07/06/2020   HCT 37.0 07/06/2020   MCV 87.3 07/06/2020   PLT 306 07/06/2020      Component Value Date/Time   NA 141 07/06/2020 0524   NA 140 12/14/2019 1448   K 3.5 07/06/2020 0524   CL 103 07/06/2020 0524   CO2 26 07/06/2020 0524   GLUCOSE 127 (H) 07/06/2020 0524   BUN 16 07/06/2020 0524   BUN 10 12/14/2019 1448   CREATININE 0.69 07/06/2020 0524   CALCIUM 8.9 07/06/2020 0524   PROT 8.0 07/02/2020 1232   ALBUMIN 4.2 07/02/2020 1232   AST 37 07/02/2020 1232   ALT 31 07/02/2020 1232   ALKPHOS 112 07/02/2020 1232   BILITOT 2.0 (H) 07/02/2020 1232   GFRNONAA >60 07/06/2020 0524   GFRAA >60 07/06/2020 0524   No results found for: CHOL, HDL, LDLCALC, LDLDIRECT, TRIG, CHOLHDL Lab Results  Component Value Date   HGBA1C 6.9 (H) 07/03/2020   No results found for: VITAMINB12 No results found for: TSH     ASSESSMENT AND PLAN  Auditory hallucination - Plan: Vitamin B12, Thyroid Panel With TSH  Elevated sed rate - Plan: Ambulatory referral to Vascular Surgery  Nonintractable headache, unspecified chronicity pattern, unspecified headache type - Plan: Ambulatory referral to Vascular Surgery  Elevated C-reactive protein (CRP) - Plan: Ambulatory referral to Vascular Surgery  Memory loss - Plan: Vitamin B12, Thyroid Panel With TSH  Myalgia - Plan: Vitamin B12, Thyroid Panel With TSH   In summary, Ms. Bartek is a 76 year old woman with several months of visual hallucinations  and mild memory loss who also has myalgias and headaches.  Lab work shows that she has had elevated sed rates and CRPs for a while.   Although 1 occurrence in 2021 was associated with infection, the others did not appear to be.  Therefore, I am concerned about the possibility of giant cell arteritis that could explain myalgias and headaches.  Rarely, GCA can lead to hallucinations and other neuropsychologic effects.  Therefore, we need to have her get a temporal artery biopsy. If positive I will initiate steroid therapy.  We will also check TSH and B12.  They will return to see me in 3 months or sooner for new or worsening neurologic symptoms.  Thank you for asked me to see Ms. Anabel Bene.  Please let me know if I can be of further assistance with her or other patients in the future.  Clovis Warwick A. Felecia Shelling, MD, [facility] 01/25/1961, 2:29 PM Certified in Neurology, Clinical Neurophysiology, Sleep Medicine and Neuroimaging  [facility] [address], [place], [place] [address] [phone]

## 2022-01-03 LAB — THYROID PANEL WITH TSH
Free Thyroxine Index: 1.4 (ref 1.2–4.9)
T3 Uptake Ratio: 23 % — ABNORMAL LOW (ref 24–39)
T4, Total: 6.3 ug/dL (ref 4.5–12.0)
TSH: 4.7 u[IU]/mL — ABNORMAL HIGH (ref 0.450–4.500)

## 2022-01-03 LAB — VITAMIN B12: Vitamin B-12: 504 pg/mL (ref 232–1245)

## 2022-01-16 ENCOUNTER — Encounter: Payer: Self-pay | Admitting: Neurology

## 2022-01-29 ENCOUNTER — Other Ambulatory Visit: Payer: Self-pay

## 2022-01-29 ENCOUNTER — Ambulatory Visit: Payer: Medicare Other | Admitting: Vascular Surgery

## 2022-01-29 ENCOUNTER — Encounter: Payer: Self-pay | Admitting: Vascular Surgery

## 2022-01-29 VITALS — BP 130/77 | HR 75 | Temp 98.5°F | Resp 20 | Ht 60.0 in | Wt 197.8 lb

## 2022-01-29 DIAGNOSIS — M316 Other giant cell arteritis: Secondary | ICD-10-CM

## 2022-01-29 NOTE — Progress Notes (Signed)
? ?Patient ID: Wanda Hanson, female   DOB: 1946/09/28, 76 y.o.   MRN: [id number] ? ?Reason for Consult: New Patient (Initial Visit) ?  ?Referred by Parke Poisson, MD ? ?Subjective:  ?   ?HPI: ? ?Wanda Hanson is a 76 y.o. female sent for evaluation with complaints of memory loss, myalgias, temporal headaches and visual hallucinations.  This has been occurring over the past few months.  She has headaches in the right temporal region with associated right jaw pain when opening her mouth.  She was found to have an elevated CRP and sed rate.  She also has proximal muscle weakness mostly in her shoulders but also in her hips she is now sent for evaluation of temporal artery biopsy. ? ?Past Medical History:  ?Diagnosis Date  ? CHF (congestive heart failure) ([place])   ? Chronic back pain   ? 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 Depression   ? Diabetes mellitus ([place])   ? Eczema   ? Fatty liver   ? GERD (gastroesophageal reflux disease)   ? Gout   ? Hyperlipidemia   ? Hypertension   ? Migraine   ? OSA (obstructive sleep apnea)   ? Peripheral neuropathy   ? Restless leg syndrome   ? Rosacea   ? Seborrheic dermatitis   ? ?Family History  ?Problem Relation Age of Onset  ? High blood pressure Mother   ? Alzheimer's disease Mother   ? Dementia Mother   ? Lung cancer Father   ? High Cholesterol Father   ? Breast cancer Sister   ? ?Past Surgical History:  ?Procedure Laterality Date  ? ABDOMINAL HYSTERECTOMY    ? BACK SURGERY    ? JOINT REPLACEMENT    ? ? ?Short Social History:  ?Social History  ? ?Tobacco Use  ? Smoking status: Never  ? Smokeless tobacco: Never  ?Substance Use Topics  ? Alcohol use: No  ? ? ?Allergies  ?Allergen Reactions  ? Benzalkonium Chloride Dermatitis and Swelling  ?  At The Site of Application ?At The Site of Application ?  ? Cortisone Swelling  ? Neomycin-Bacitracin-Polymyxin [Bacitracin-Neomycin-Polymyxin] Swelling  ? Pollen Extract Other (See Comments)  ?  Typical "Hay Fever"  symptoms ?Stuffy nose  ? Ace Inhibitors Swelling  ? Asa [Aspirin]   ?  Ulcer ?  ? Latex   ? Morphine And Related   ?  SOB ?  ? Neosporin [Bacitracin-Polymyxin B]   ?  Swelling where applied   ? ? ?Current Outpatient Medications  ?Medication Sig Dispense Refill  ? ACCU-CHEK GUIDE test strip     ? acetaminophen (TYLENOL) 325 MG tablet Take 325 mg by mouth every 6 (six) hours as needed for mild pain or headache.    ? albuterol (VENTOLIN HFA) 108 (90 Base) MCG/ACT inhaler Inhale 2 puffs into the lungs every 6 (six) hours as needed for wheezing or shortness of breath. 8 g 6  ? allopurinol (ZYLOPRIM) 300 MG tablet Take 300 mg by mouth daily.    ? buPROPion ER (WELLBUTRIN SR) 100 MG 12 hr tablet Take 100 mg by mouth every morning.    ? diclofenac Sodium (VOLTAREN) 1 % GEL Apply 2 g topically 4 (four) times daily as needed (right toe pain).    ? DULoxetine (CYMBALTA) 60 MG capsule Take 60 mg by mouth daily.    ? furosemide (LASIX) 40 MG tablet Take by mouth. Pt takes '40mg'$  in am and 20 mg in afternno    ?  gabapentin (NEURONTIN) 800 MG tablet Take 1,600 mg by mouth 2 (two) times daily.     ? JARDIANCE 10 MG TABS tablet Take 10 mg by mouth daily.    ? LORazepam (ATIVAN) 0.5 MG tablet Take 1 mg by mouth at bedtime.    ? losartan (COZAAR) 50 MG tablet Take 1 tablet (50 mg total) by mouth daily. 90 tablet 3  ? metolazone (ZAROXOLYN) 2.5 MG tablet Take by mouth.    ? nebivolol (BYSTOLIC) 5 MG tablet Take 1 tablet (5 mg total) by mouth daily. 90 tablet 3  ? nilotinib (TASIGNA) 200 MG capsule Take 200 mg by mouth every 12 (twelve) hours. Give on an empty stomach 1 hour before or 2 hours after meals.    ? polyvinyl alcohol (LIQUIFILM TEARS) 1.4 % ophthalmic solution Place 1 drop into both eyes as needed for dry eyes.    ? Potassium Chloride ER 20 MEQ TBCR Take 20 mEq by mouth daily.    ? rOPINIRole (REQUIP) 1 MG tablet Take 3 mg by mouth at bedtime.     ? SYMBICORT 160-4.5 MCG/ACT inhaler INHALE TWO PUFFS BY MOUTH TWICE A DAY 10.2 g  5  ? tiZANidine (ZANAFLEX) 4 MG tablet Take by mouth.    ? traMADol (ULTRAM) 50 MG tablet Take 50 mg by mouth every 6 (six) hours as needed.    ? Vitamin D, Ergocalciferol, (DRISDOL) 1.25 MG (50000 UNIT) CAPS capsule Take 50,000 Units by mouth once a week.    ? ?No current facility-administered medications for this visit.  ? ? ?Review of Systems  ?Constitutional:  Constitutional negative. ?HENT: HENT negative.  ?Eyes: Positive for visual disturbance.   ?Respiratory: Respiratory negative.  ?Cardiovascular: Cardiovascular negative.  ?GI: Gastrointestinal negative.  ?Skin: Skin negative.  ?Neurological: Positive for headaches.  ?Hematologic: Hematologic/lymphatic negative.  ?Psychiatric: Psychiatric negative.   ? ?   ?Objective:  ?Objective  ?Vitals:  ? 01/29/22 1049  ?BP: 130/77  ?Pulse: 75  ?Resp: 20  ?Temp: 98.5 ?F (36.9 ?C)  ?SpO2: 95%  ? ? ?Physical Exam ?HENT:  ?   Head: Normocephalic.  ?   Comments: No significant tenderness overlying the palpable right temporal artery pulse ?   Nose:  ?   Comments: Wearing a mask ?Eyes:  ?   Pupils: Pupils are equal, round, and reactive to light.  ?Cardiovascular:  ?   Rate and Rhythm: Normal rate.  ?Abdominal:  ?   General: Abdomen is flat.  ?   Palpations: Abdomen is soft.  ?Musculoskeletal:     ?   General: Normal range of motion.  ?   Cervical back: Normal range of motion.  ?   Right lower leg: No edema.  ?   Left lower leg: No edema.  ?Skin: ?   General: Skin is warm and dry.  ?   Capillary Refill: Capillary refill takes less than 2 seconds.  ?Neurological:  ?   General: No focal deficit present.  ?   Mental Status: She is alert.  ?Psychiatric:     ?   Mood and Affect: Mood normal.     ?   Thought Content: Thought content normal.  ? ? ?Data: ?No studies ?    ?Assessment/Plan:  ?  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  I discussed with her the procedural details risk benefits and  alternatives of temporal artery biopsy and her and her husband demonstrate good  understanding we will get this scheduled in the near future. ? ?  ? ?Waynetta Sandy MD ?[facility] ? ? ?

## 2022-01-29 NOTE — H&P (View-Only) (Signed)
? ?Patient ID: Wanda Hanson, female   DOB: 1946/04/17, 76 y.o.   MRN: [id number] ? ?Reason for Consult: New Patient (Initial Visit) ?  ?Referred by Parke Poisson, MD ? ?Subjective:  ?   ?HPI: ? ?Carlota Philley is a 76 y.o. female sent for evaluation with complaints of memory loss, myalgias, temporal headaches and visual hallucinations.  This has been occurring over the past few months.  She has headaches in the right temporal region with associated right jaw pain when opening her mouth.  She was found to have an elevated CRP and sed rate.  She also has proximal muscle weakness mostly in her shoulders but also in her hips she is now sent for evaluation of temporal artery biopsy. ? ?Past Medical History:  ?Diagnosis Date  ? CHF (congestive heart failure) ([place])   ? Chronic back pain   ? 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 Depression   ? Diabetes mellitus ([place])   ? Eczema   ? Fatty liver   ? GERD (gastroesophageal reflux disease)   ? Gout   ? Hyperlipidemia   ? Hypertension   ? Migraine   ? OSA (obstructive sleep apnea)   ? Peripheral neuropathy   ? Restless leg syndrome   ? Rosacea   ? Seborrheic dermatitis   ? ?Family History  ?Problem Relation Age of Onset  ? High blood pressure Mother   ? Alzheimer's disease Mother   ? Dementia Mother   ? Lung cancer Father   ? High Cholesterol Father   ? Breast cancer Sister   ? ?Past Surgical History:  ?Procedure Laterality Date  ? ABDOMINAL HYSTERECTOMY    ? BACK SURGERY    ? JOINT REPLACEMENT    ? ? ?Short Social History:  ?Social History  ? ?Tobacco Use  ? Smoking status: Never  ? Smokeless tobacco: Never  ?Substance Use Topics  ? Alcohol use: No  ? ? ?Allergies  ?Allergen Reactions  ? Benzalkonium Chloride Dermatitis and Swelling  ?  At The Site of Application ?At The Site of Application ?  ? Cortisone Swelling  ? Neomycin-Bacitracin-Polymyxin [Bacitracin-Neomycin-Polymyxin] Swelling  ? Pollen Extract Other (See Comments)  ?  Typical "Hay Fever"  symptoms ?Stuffy nose  ? Ace Inhibitors Swelling  ? Asa [Aspirin]   ?  Ulcer ?  ? Latex   ? Morphine And Related   ?  SOB ?  ? Neosporin [Bacitracin-Polymyxin B]   ?  Swelling where applied   ? ? ?Current Outpatient Medications  ?Medication Sig Dispense Refill  ? ACCU-CHEK GUIDE test strip     ? acetaminophen (TYLENOL) 325 MG tablet Take 325 mg by mouth every 6 (six) hours as needed for mild pain or headache.    ? albuterol (VENTOLIN HFA) 108 (90 Base) MCG/ACT inhaler Inhale 2 puffs into the lungs every 6 (six) hours as needed for wheezing or shortness of breath. 8 g 6  ? allopurinol (ZYLOPRIM) 300 MG tablet Take 300 mg by mouth daily.    ? buPROPion ER (WELLBUTRIN SR) 100 MG 12 hr tablet Take 100 mg by mouth every morning.    ? diclofenac Sodium (VOLTAREN) 1 % GEL Apply 2 g topically 4 (four) times daily as needed (right toe pain).    ? DULoxetine (CYMBALTA) 60 MG capsule Take 60 mg by mouth daily.    ? furosemide (LASIX) 40 MG tablet Take by mouth. Pt takes '40mg'$  in am and 20 mg in afternno    ?  gabapentin (NEURONTIN) 800 MG tablet Take 1,600 mg by mouth 2 (two) times daily.     ? JARDIANCE 10 MG TABS tablet Take 10 mg by mouth daily.    ? LORazepam (ATIVAN) 0.5 MG tablet Take 1 mg by mouth at bedtime.    ? losartan (COZAAR) 50 MG tablet Take 1 tablet (50 mg total) by mouth daily. 90 tablet 3  ? metolazone (ZAROXOLYN) 2.5 MG tablet Take by mouth.    ? nebivolol (BYSTOLIC) 5 MG tablet Take 1 tablet (5 mg total) by mouth daily. 90 tablet 3  ? nilotinib (TASIGNA) 200 MG capsule Take 200 mg by mouth every 12 (twelve) hours. Give on an empty stomach 1 hour before or 2 hours after meals.    ? polyvinyl alcohol (LIQUIFILM TEARS) 1.4 % ophthalmic solution Place 1 drop into both eyes as needed for dry eyes.    ? Potassium Chloride ER 20 MEQ TBCR Take 20 mEq by mouth daily.    ? rOPINIRole (REQUIP) 1 MG tablet Take 3 mg by mouth at bedtime.     ? SYMBICORT 160-4.5 MCG/ACT inhaler INHALE TWO PUFFS BY MOUTH TWICE A DAY 10.2 g  5  ? tiZANidine (ZANAFLEX) 4 MG tablet Take by mouth.    ? traMADol (ULTRAM) 50 MG tablet Take 50 mg by mouth every 6 (six) hours as needed.    ? Vitamin D, Ergocalciferol, (DRISDOL) 1.25 MG (50000 UNIT) CAPS capsule Take 50,000 Units by mouth once a week.    ? ?No current facility-administered medications for this visit.  ? ? ?Review of Systems  ?Constitutional:  Constitutional negative. ?HENT: HENT negative.  ?Eyes: Positive for visual disturbance.   ?Respiratory: Respiratory negative.  ?Cardiovascular: Cardiovascular negative.  ?GI: Gastrointestinal negative.  ?Skin: Skin negative.  ?Neurological: Positive for headaches.  ?Hematologic: Hematologic/lymphatic negative.  ?Psychiatric: Psychiatric negative.   ? ?   ?Objective:  ?Objective  ?Vitals:  ? 01/29/22 1049  ?BP: 130/77  ?Pulse: 75  ?Resp: 20  ?Temp: 98.5 ?F (36.9 ?C)  ?SpO2: 95%  ? ? ?Physical Exam ?HENT:  ?   Head: Normocephalic.  ?   Comments: No significant tenderness overlying the palpable right temporal artery pulse ?   Nose:  ?   Comments: Wearing a mask ?Eyes:  ?   Pupils: Pupils are equal, round, and reactive to light.  ?Cardiovascular:  ?   Rate and Rhythm: Normal rate.  ?Abdominal:  ?   General: Abdomen is flat.  ?   Palpations: Abdomen is soft.  ?Musculoskeletal:     ?   General: Normal range of motion.  ?   Cervical back: Normal range of motion.  ?   Right lower leg: No edema.  ?   Left lower leg: No edema.  ?Skin: ?   General: Skin is warm and dry.  ?   Capillary Refill: Capillary refill takes less than 2 seconds.  ?Neurological:  ?   General: No focal deficit present.  ?   Mental Status: She is alert.  ?Psychiatric:     ?   Mood and Affect: Mood normal.     ?   Thought Content: Thought content normal.  ? ? ?Data: ?No studies ?    ?Assessment/Plan:  ?  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  I discussed with her the procedural details risk benefits and  alternatives of temporal artery biopsy and her and her husband demonstrate good  understanding we will get this scheduled in the near future. ? ?  ? ?Waynetta Sandy MD ?[facility] ? ? ?

## 2022-01-31 NOTE — Pre-Procedure Instructions (Addendum)
Surgical Instructions    Your procedure is scheduled on Tuesday 02/04/22.   Report to [facility] Main Entrance "A" at 09:30 A.M., then check in with the Admitting office.  Call this number if you have problems the morning of surgery:  [phone]   If you have any questions prior to your surgery date call [phone]: Open Monday-Friday 8am-4pm    Remember:  Do not eat or drink after midnight the night before your surgery    Take these medicines the morning of surgery with A SIP OF WATER:   buPROPion ER (WELLBUTRIN SR)   DULoxetine (CYMBALTA)  gabapentin (NEURONTIN)  nebivolol (BYSTOLIC)   potassium chloride (MICRO-K)   SYMBICORT 160-4.5 MCG/ACT inhaler   Take these medicines if needed:  Acetaminophen  albuterol (VENTOLIN HFA) 108 (90 Base)  meclizine (ANTIVERT)   ondansetron (ZOFRAN)  SUMAtriptan (IMITREX)  As of today, STOP taking any Aspirin (unless otherwise instructed by your surgeon) diclofenac Sodium (VOLTAREN), Aleve, Naproxen, Ibuprofen, Motrin, Advil, Goody's, BC's, all herbal medications, fish oil, and all vitamins.   HOW TO MANAGE YOUR DIABETES BEFORE AND AFTER SURGERY  Why is it important to control my blood sugar before and after surgery? Improving blood sugar levels before and after surgery helps healing and can limit problems. A way of improving blood sugar control is eating a healthy diet by:  Eating less sugar and carbohydrates  Increasing activity/exercise  Talking with your doctor about reaching your blood sugar goals High blood sugars (greater than 180 mg/dL) can raise your risk of infections and slow your recovery, so you will need to focus on controlling your diabetes during the weeks before surgery. Make sure that the doctor who takes care of your diabetes knows about your planned surgery including the date and location.  How do I manage my blood sugar before surgery? Check your blood sugar at least 4 times a day, starting 2 days before surgery,  to make sure that the level is not too high or low. Check your blood sugar the morning of your surgery when you wake up and every 2 hours until you get to the Short Stay unit. If your blood sugar is less than 70 mg/dL, you will need to treat for low blood sugar: Do not take insulin. Treat a low blood sugar (less than 70 mg/dL) with  cup of clear juice (cranberry or apple), 4 glucose tablets, OR glucose gel. Recheck blood sugar in 15 minutes after treatment (to make sure it is greater than 70 mg/dL). If your blood sugar is not greater than 70 mg/dL on recheck, call [phone]  for further instructions. Report your blood sugar to the short stay nurse when you get to Short Stay.  If you are admitted to the hospital after surgery: Your blood sugar will be checked by the staff and you will probably be given insulin after surgery (instead of oral diabetes medicines) to make sure you have good blood sugar levels. The goal for blood sugar control after surgery is 80-180 mg/dL.     WHAT DO I DO ABOUT MY DIABETES MEDICATION?   Do not take oral diabetes medicines (pills):  the morning of surgery.  DO NOT TAKE JARDIANCE THE DAY BEFORE SURGERY OR THE MORNING OF SURGERY.  The day of surgery, do not take other diabetes injectables, including Byetta (exenatide), Bydureon (exenatide ER), Victoza (liraglutide), or Trulicity (dulaglutide).            Do not wear jewelry or makeup Do not wear lotions, powders,  perfumes/colognes, or deodorant. Do not shave 48 hours prior to surgery.  Men may shave face and neck. Do not bring valuables to the hospital. Do not wear nail polish, gel polish, artificial nails, or any other type of covering on natural nails (fingers and toes) If you have artificial nails or gel coating that need to be removed by a nail salon, please have this removed prior to surgery. Artificial nails or gel coating may interfere with anesthesia's ability to adequately monitor your vital  signs.  [facility] is not responsible for any belongings or valuables. .   Do NOT Smoke (Tobacco/Vaping)  24 hours prior to your procedure  If you use a CPAP at night, you may bring your mask for your overnight stay.   Contacts, glasses, hearing aids, dentures or partials may not be worn into surgery, please bring cases for these belongings   For patients admitted to the hospital, discharge time will be determined by your treatment team.   Patients discharged the day of surgery will not be allowed to drive home, and someone needs to stay with them for 24 hours.  NO VISITORS WILL BE ALLOWED IN PRE-OP WHERE PATIENTS ARE PREPPED FOR SURGERY.  ONLY 1 SUPPORT PERSON MAY BE PRESENT IN THE WAITING ROOM WHILE YOU ARE IN SURGERY.  IF YOU ARE TO BE ADMITTED, ONCE YOU ARE IN YOUR ROOM YOU WILL BE ALLOWED TWO (2) VISITORS. 1 (ONE) VISITOR MAY STAY OVERNIGHT BUT MUST ARRIVE TO THE ROOM BY 8pm.  Minor children may have two parents present. Special consideration for safety and communication needs will be reviewed on a case by case basis.  Special instructions:    Oral Hygiene is also important to reduce your risk of infection.  Remember - BRUSH YOUR TEETH THE MORNING OF SURGERY WITH YOUR REGULAR TOOTHPASTE   [facility]- Preparing For Surgery  Before surgery, you can play an important role. Because skin is not sterile, your skin needs to be as free of germs as possible. You can reduce the number of germs on your skin by washing with CHG (chlorahexidine gluconate) Soap before surgery.  CHG is an antiseptic cleaner which kills germs and bonds with the skin to continue killing germs even after washing.     Please do not use if you have an allergy to CHG or antibacterial soaps. If your skin becomes reddened/irritated stop using the CHG.  Do not shave (including legs and underarms) for at least 48 hours prior to first CHG shower. It is OK to shave your face.  Please follow these instructions  carefully.     Shower the NIGHT BEFORE SURGERY and the MORNING OF SURGERY with CHG Soap.   If you chose to wash your hair, wash your hair first as usual with your normal shampoo. After you shampoo, rinse your hair and body thoroughly to remove the shampoo.  Then [employer] and genitals (private parts) with your normal soap and rinse thoroughly to remove soap.  After that Use CHG Soap as you would any other liquid soap. You can apply CHG directly to the skin and wash gently with a scrungie or a clean washcloth.   Apply the CHG Soap to your body ONLY FROM THE NECK DOWN.  Do not use on open wounds or open sores. Avoid contact with your eyes, ears, mouth and genitals (private parts). Wash Face and genitals (private parts)  with your normal soap.   Wash thoroughly, paying special attention to the area where your surgery  will be performed.  Thoroughly rinse your body with warm water from the neck down.  DO NOT shower/wash with your normal soap after using and rinsing off the CHG Soap.  Pat yourself dry with a CLEAN TOWEL.  Wear CLEAN PAJAMAS to bed the night before surgery  Place CLEAN SHEETS on your bed the night before your surgery  DO NOT SLEEP WITH PETS.   Day of Surgery:  Take a shower with CHG soap. Wear Clean/Comfortable clothing the morning of surgery Do not apply any deodorants/lotions.   Remember to brush your teeth WITH YOUR REGULAR TOOTHPASTE.    COVID testing  If you are going to stay overnight or be admitted after your procedure/surgery and require a pre-op COVID test, please follow these instructions after your COVID test   You are not required to quarantine however you are required to wear a well-fitting mask when you are out and around people not in your household.  If your mask becomes wet or soiled, replace with a new one.  Wash your hands often with soap and water for 20 seconds or clean your hands with an alcohol-based hand sanitizer that contains at least 60%  alcohol.  Do not share personal items.  Notify your provider: if you are in close contact with someone who has COVID  or if you develop a fever of 100.4 or greater, sneezing, cough, sore throat, shortness of breath or body aches.    Please read over the following fact sheets that you were given.

## 2022-02-03 ENCOUNTER — Encounter (HOSPITAL_COMMUNITY)
Admission: RE | Admit: 2022-02-03 | Discharge: 2022-02-03 | Disposition: A | Discharge status: Home / Self Care | Payer: Medicare Other | Source: Ambulatory Visit | Attending: Vascular Surgery | Admitting: Vascular Surgery

## 2022-02-03 ENCOUNTER — Other Ambulatory Visit: Payer: Self-pay

## 2022-02-03 ENCOUNTER — Encounter (HOSPITAL_COMMUNITY): Payer: Self-pay

## 2022-02-03 DIAGNOSIS — Z01818 Encounter for other preprocedural examination: Secondary | ICD-10-CM | POA: Diagnosis present

## 2022-02-03 HISTORY — DX: Unspecified osteoarthritis, unspecified site: M19.90

## 2022-02-03 LAB — GLUCOSE, CAPILLARY: Glucose-Capillary: 120 mg/dL — ABNORMAL HIGH (ref 70–99)

## 2022-02-03 NOTE — Anesthesia Preprocedure Evaluation (Addendum)
Anesthesia Evaluation  Patient identified by MRN, date of birth, ID band Patient awake    Reviewed: Allergy & Precautions, NPO status , Patient's Chart, lab work & pertinent test results  Airway Mallampati: II  TM Distance: >3 FB Neck ROM: Full    Dental  (+) Poor Dentition, Dental Advisory Given, Missing   Pulmonary shortness of breath, asthma , sleep apnea , pneumonia,    breath sounds clear to auscultation       Cardiovascular hypertension, Pt. on home beta blockers and Pt. on medications + CAD and +CHF   Rhythm:Regular Rate:Normal  Echo 06/2020 1. Left ventricular ejection fraction, by estimation, is 60 to 65%. The left ventricle has normal function. The left ventricle has no regional wall motion abnormalities. Left ventricular diastolic parameters are consistent with Grade II diastolic dysfunction (pseudonormalization). Elevated left atrial pressure.  2. Right ventricular systolic function is normal. The right ventricular size is mildly enlarged. Tricuspid regurgitation signal is inadequate for assessing PA pressure.  3. The mitral valve is normal in structure. No evidence of mitral valve regurgitation. No evidence of mitral stenosis.  4. The aortic valve is grossly normal. Aortic valve regurgitation is not visualized. No aortic stenosis is present.  5. The inferior vena cava is dilated in size with <50% respiratory variability, suggesting right atrial pressure of 15 mmHg.    Neuro/Psych  Headaches, PSYCHIATRIC DISORDERS Depression  Neuromuscular disease    GI/Hepatic Neg liver ROS, GERD  ,  Endo/Other  diabetes  Renal/GU negative Renal ROS     Musculoskeletal  (+) Arthritis ,   Abdominal (+) + obese,   Peds  Hematology negative hematology ROS (+)   Anesthesia Other Findings   Reproductive/Obstetrics                           Anesthesia Physical Anesthesia Plan  ASA: 3  Anesthesia  Plan: General   Post-op Pain Management: Tylenol PO (pre-op)* and Minimal or no pain anticipated   Induction: Intravenous  PONV Risk Score and Plan: Ondansetron, Dexamethasone and Treatment may vary due to age or medical condition  Airway Management Planned: LMA  Additional Equipment:   Intra-op Plan:   Post-operative Plan: Extubation in OR  Informed Consent: I have reviewed the patients History and Physical, chart, labs and discussed the procedure including the risks, benefits and alternatives for the proposed anesthesia with the patient or authorized representative who has indicated his/her understanding and acceptance.     Dental advisory given  Plan Discussed with: CRNA  Anesthesia Plan Comments: (PAT note by Antionette Poles, PA-C:  History of HFpEF, HTN, HLD, DOE followed by cardiology at [facility].  Echo 06/2020 showed EF 60 to 65%, grade 2 diastolic dysfunction, no significant valvular abnormalities.  Nuclear stress 07/2020 was nonischemic.  Follows with pulmonology for history of mild persistent asthma, OSA on CPAP.  Per notes, patient has good compliance with CPAP.  History of CML in remission, followed by Dr. Lowell Guitar with oncology at [facility].  Patient currently undergoing evaluation of recent development of hallucinations and paranoia.  This has been followed by PCP Dr. Izola Price at [facility].  Recent MRI of the brain 12/25/2021 showed no acute intracranial process, no etiology for patient's hallucinations or paranoia.   Patient referred to neurology and was seen by Dr. Epimenio Foot on 01/02/2019.  For further evaluation.  She was noted to have persistently elevated CRP and referred to vascular surgery for evaluation of possible GCA.  Non-insulin-dependent DM2,  last A1c 6.8 on 01/13/2022.  Followed by endocrinology at [facility] and CBC from 01/24/2022 in Care Everywhere reviewed, unremarkable.  EKG 02/03/2022: NSR.  Rate 77.  LAD.  Inferior infarct, age undetermined.  Anteroseptal infarct, age  undetermined.  Carotid duplex 10/11/2021 (Care Everywhere): Conclusion:   Right: <50% ICA stenosis.   Left: <50% ICA stenosis.   Nuclear stress 08/03/2020 (Care Everywhere): IMPRESSION: Normal cardiac perfusion exam. Prognostically this is a low risk scan   TECHNIQUE: After the perfusion exam, gated images the left ventricle were analyzed by computer. Left ventricular ejection fraction was measured.   FINDINGS: Left ventricular ejection fraction is calculated to be 87 %.   IMPRESSION: Left ventricular ejection fraction of 87%.   TECHNIQUE: Gated imaging of the left ventricle was performed after the perfusion exam.   FINDINGS: Dynamic imaging of the left ventricle demonstrates no wall motion abnormalities.   IMPRESSION: Normal left ventricular wall motion.   )      Anesthesia Quick Evaluation

## 2022-02-03 NOTE — Progress Notes (Signed)
Anesthesia Chart Review: ? ?History of HFpEF, HTN, HLD, DOE followed by cardiology at [facility].  Echo 06/2020 showed EF 60 to 31%, grade 2 diastolic dysfunction, no significant valvular abnormalities.  Nuclear stress 07/2020 was nonischemic. ? ?Follows with pulmonology for history of mild persistent asthma, OSA on CPAP.  Per notes, patient has good compliance with CPAP. ? ?History of CML in remission, followed by Dr. Florene Glen with oncology at [facility]. ? ?Patient currently undergoing evaluation of recent development of hallucinations and paranoia.  This has been followed by PCP Dr. Doyle Askew at [facility].  Recent MRI of the brain 12/25/2021 showed no acute intracranial process, no etiology for patient's hallucinations or paranoia.   ?Patient referred to neurology and was seen by Dr. Felecia Shelling on 01/02/2019.  For further evaluation.  She was noted to have persistently elevated CRP and referred to vascular surgery for evaluation of possible GCA. ? ?Non-insulin-dependent DM2, last A1c 6.8 on 01/13/2022.  Followed by endocrinology at [facility]. ? ?CMP and CBC from 01/24/2022 in Care Everywhere reviewed, unremarkable. ? ?EKG 02/03/2022: NSR.  Rate 77.  LAD.  Inferior infarct, age undetermined.  Anteroseptal infarct, age undetermined. ? ?Carotid duplex 10/11/2021 (Care Everywhere): ?Conclusion:  ? ?Right:  <50% ICA stenosis.  ? ?Left:  <50% ICA stenosis.   ? ?Nuclear stress 08/03/2020 (Care Everywhere): ?IMPRESSION: Normal cardiac perfusion exam. Prognostically this is a low risk scan  ? ?TECHNIQUE: After the perfusion exam, gated images the left ventricle were analyzed by computer. Left ventricular ejection fraction was measured.  ? ?FINDINGS: Left ventricular ejection fraction is calculated to be 87 %.  ? ?IMPRESSION: Left ventricular ejection fraction of 87%.  ? ?TECHNIQUE: Gated imaging of the left ventricle was performed after the perfusion exam.  ? ?FINDINGS: Dynamic imaging of the left ventricle demonstrates no wall motion abnormalities.   ? ?IMPRESSION: Normal left ventricular wall motion.  ? ? ?Karoline Caldwell, PA-C ?[facility] Short Stay Center/Anesthesiology ?Phone [phone] ?02/03/2022 12:26 PM ? ?

## 2022-02-03 NOTE — Progress Notes (Signed)
PCP - Zack Seal ?Cardiologist - Dr. Loni Beckwith  ?Last office visit 08/19/21 ? ?Chest x-ray - n/a ?EKG - 02/03/22 ?Stress Test - 2021 ?ECHO - 07/03/20 ?Cardiac Cath - 2006 ? ?CPAP - yes ? ?DM - Type 2 ?Fasting Blood Sugar - 120-130 ? ? ?COVID TEST- n/a (ambulatory) ? ? ?Anesthesia review: yes, heart history ? ?Patient denies shortness of breath, fever, cough and chest pain at PAT appointment ? ? ?All instructions explained to the patient, with a verbal understanding of the material. Patient agrees to go over the instructions while at home for a better understanding. Patient also instructed to self quarantine after being tested for COVID-19. The opportunity to ask questions was provided. ? ? ?

## 2022-02-03 NOTE — Pre-Procedure Instructions (Addendum)
Surgical Instructions ? ? ? Your procedure is scheduled on Tuesday 02/04/22. ? ? Report to [facility] Main Entrance "A" at 10:30 A.M., then check in with the Admitting office. ? Call this number if you have problems the morning of surgery: ? [phone] ? ? If you have any questions prior to your surgery date call [phone]: Open Monday-Friday 8am-4pm ? ? ? Remember: ? Do not eat or drink after midnight the night before your surgery ?  ? Take these medicines the morning of surgery with A SIP OF WATER:  ? buPROPion ER [facility] SR)  ? DULoxetine (CYMBALTA) ? gabapentin (NEURONTIN) ? nebivolol (BYSTOLIC)  ? SYMBICORT 160-4.5 MCG/ACT inhaler ? ? Take these medicines if needed: ? Acetaminophen ? albuterol (VENTOLIN HFA) 108 (90 Base) ? meclizine (ANTIVERT)  ? ondansetron [facility]) ? SUMAtriptan (IMITREX) ? ?As of today, STOP taking any Aspirin (unless otherwise instructed by your surgeon) diclofenac Sodium (VOLTAREN), Aleve, Naproxen, Ibuprofen, Motrin, Advil, Goody's, BC's, all herbal medications, fish oil, and all vitamins. ? ? ?HOW TO MANAGE YOUR DIABETES ?BEFORE AND AFTER SURGERY ? ?Why is it important to control my blood sugar before and after surgery? ?Improving blood sugar levels before and after surgery helps healing and can limit problems. ?A way of improving blood sugar control is eating a healthy diet by: ? Eating less sugar and carbohydrates ? Increasing activity/exercise ? Talking with your doctor about reaching your blood sugar goals ?High blood sugars (greater than 180 mg/dL) can raise your risk of infections and slow your recovery, so you will need to focus on controlling your diabetes during the weeks before surgery. ?Make sure that the doctor who takes care of your diabetes knows about your planned surgery including the date and location. ? ?How do I manage my blood sugar before surgery? ?Check your blood sugar at least 4 times a day, starting 2 days before surgery, to make sure that the level is  not too high or low. ?Check your blood sugar the morning of your surgery when you wake up and every 2 hours until you get to the Short Stay unit. ?If your blood sugar is less than 70 mg/dL, you will need to treat for low blood sugar: ?Do not take insulin. ?Treat a low blood sugar (less than 70 mg/dL) with ? cup of clear juice (cranberry or apple), 4 glucose tablets, OR glucose gel. ?Recheck blood sugar in 15 minutes after treatment (to make sure it is greater than 70 mg/dL). If your blood sugar is not greater than 70 mg/dL on recheck, call [phone] ? for further instructions. ?Report your blood sugar to the short stay nurse when you get to Short Stay. ? ?If you are admitted to the hospital after surgery: ?Your blood sugar will be checked by the staff and you will probably be given insulin after surgery (instead of oral diabetes medicines) to make sure you have good blood sugar levels. ?The goal for blood sugar control after surgery is 80-180 mg/dL. ? ? ?  ?WHAT DO I DO ABOUT MY DIABETES MEDICATION? ? ? ?Do not take oral diabetes medicines (pills):  the morning of surgery. ? ?DO NOT TAKE JARDIANCE THE DAY BEFORE SURGERY OR THE MORNING OF SURGERY. ? ?The day of surgery, do not take other diabetes injectables, including Byetta (exenatide), Bydureon (exenatide ER), Victoza (liraglutide), or Trulicity (dulaglutide). ? ? ?         DAY OF SURGERY: ?Do not wear jewelry or makeup ?Do not wear lotions, powders, perfumes, or  deodorant. ?Do not shave 48 hours prior to surgery.   ?Do not bring valuables to the hospital. ?Do not wear nail polish, gel polish, artificial nails, or any other type of covering on natural nails (fingers and toes) ? ?[facility] is not responsible for any belongings or valuables. .  ? ?Do NOT Smoke (Tobacco/Vaping)  24 hours prior to your procedure ? ?If you use a CPAP at night, you may bring your mask for your overnight stay. ?  ?Contacts, glasses, hearing aids, dentures or partials may not be worn  into surgery, please bring cases for these belongings ?  ?For patients admitted to the hospital, discharge time will be determined by your treatment team. ?  ?Patients discharged the day of surgery will not be allowed to drive home, and someone needs to stay with them for 24 hours. ? ?NO VISITORS WILL BE ALLOWED IN PRE-OP WHERE PATIENTS ARE PREPPED FOR SURGERY.  ONLY 1 SUPPORT PERSON MAY BE PRESENT IN THE WAITING ROOM WHILE YOU ARE IN SURGERY.  IF YOU ARE TO BE ADMITTED, ONCE YOU ARE IN YOUR ROOM YOU WILL BE ALLOWED TWO (2) VISITORS. 1 (ONE) VISITOR MAY STAY OVERNIGHT BUT MUST ARRIVE TO THE ROOM BY 8pm.  Minor children may have two parents present. Special consideration for safety and communication needs will be reviewed on a case by case basis. ? ?Special instructions:   ? ?Oral Hygiene is also important to reduce your risk of infection.  Remember - BRUSH YOUR TEETH THE MORNING OF SURGERY WITH YOUR REGULAR TOOTHPASTE ? ? ?[facility]- Preparing For Surgery ? ?Before surgery, you can play an important role. Because skin is not sterile, your skin needs to be as free of germs as possible. You can reduce the number of germs on your skin by washing with CHG (chlorahexidine gluconate) Soap before surgery.  CHG is an antiseptic cleaner which kills germs and bonds with the skin to continue killing germs even after washing.   ? ? ?Please do not use if you have an allergy to CHG or antibacterial soaps. If your skin becomes reddened/irritated stop using the CHG.  ?Do not shave (including legs and underarms) for at least 48 hours prior to first CHG shower. It is OK to shave your face. ? ?Please follow these instructions carefully. ?  ? ? Shower the NIGHT BEFORE SURGERY and the MORNING OF SURGERY with CHG Soap.  ? If you chose to wash your hair, wash your hair first as usual with your normal shampoo. After you shampoo, rinse your hair and body thoroughly to remove the shampoo.  Then [employer] and genitals (private parts) with  your normal soap and rinse thoroughly to remove soap. ? ?After that Use CHG Soap as you would any other liquid soap. You can apply CHG directly to the skin and wash gently with a scrungie or a clean washcloth.  ? ?Apply the CHG Soap to your body ONLY FROM THE NECK DOWN.  Do not use on open wounds or open sores. Avoid contact with your eyes, ears, mouth and genitals (private parts). Wash Face and genitals (private parts)  with your normal soap.  ? ?Wash thoroughly, paying special attention to the area where your surgery will be performed. ? ?Thoroughly rinse your body with warm water from the neck down. ? ?DO NOT shower/wash with your normal soap after using and rinsing off the CHG Soap. ? ?Pat yourself dry with a CLEAN TOWEL. ? ?Wear CLEAN PAJAMAS to bed the night  before surgery ? ?Place CLEAN SHEETS on your bed the night before your surgery ? ?DO NOT SLEEP WITH PETS. ? ? ?Day of Surgery: ? ?Take a shower with CHG soap. ?Wear Clean/Comfortable clothing the morning of surgery ?Do not apply any deodorants/lotions.   ?Remember to brush your teeth WITH YOUR REGULAR TOOTHPASTE. ? ?  ?Please read over the following fact sheets that you were given.  ? ?

## 2022-02-04 ENCOUNTER — Encounter (HOSPITAL_COMMUNITY)
Admission: RE | Disposition: A | Discharge status: Home / Self Care | Payer: Self-pay | Source: Home / Self Care | Attending: Vascular Surgery

## 2022-02-04 ENCOUNTER — Other Ambulatory Visit: Payer: Self-pay

## 2022-02-04 ENCOUNTER — Ambulatory Visit (HOSPITAL_BASED_OUTPATIENT_CLINIC_OR_DEPARTMENT_OTHER): Payer: Medicare Other | Admitting: Physician Assistant

## 2022-02-04 ENCOUNTER — Ambulatory Visit (HOSPITAL_COMMUNITY): Payer: Medicare Other | Admitting: Physician Assistant

## 2022-02-04 ENCOUNTER — Other Ambulatory Visit: Payer: Self-pay | Admitting: Pulmonary Disease

## 2022-02-04 ENCOUNTER — Telehealth: Payer: Self-pay

## 2022-02-04 ENCOUNTER — Encounter (HOSPITAL_COMMUNITY): Payer: Self-pay | Admitting: Vascular Surgery

## 2022-02-04 ENCOUNTER — Ambulatory Visit (HOSPITAL_COMMUNITY)
Admission: RE | Admit: 2022-02-04 | Discharge: 2022-02-04 | Disposition: A | Discharge status: Home / Self Care | Payer: Medicare Other | Attending: Vascular Surgery | Admitting: Vascular Surgery

## 2022-02-04 DIAGNOSIS — I2510 Atherosclerotic heart disease of native coronary artery without angina pectoris: Secondary | ICD-10-CM

## 2022-02-04 DIAGNOSIS — R413 Other amnesia: Secondary | ICD-10-CM | POA: Diagnosis present

## 2022-02-04 DIAGNOSIS — R441 Visual hallucinations: Secondary | ICD-10-CM | POA: Diagnosis not present

## 2022-02-04 DIAGNOSIS — F32A Depression, unspecified: Secondary | ICD-10-CM | POA: Diagnosis not present

## 2022-02-04 DIAGNOSIS — J45909 Unspecified asthma, uncomplicated: Secondary | ICD-10-CM | POA: Diagnosis not present

## 2022-02-04 DIAGNOSIS — E119 Type 2 diabetes mellitus without complications: Secondary | ICD-10-CM | POA: Insufficient documentation

## 2022-02-04 DIAGNOSIS — I110 Hypertensive heart disease with heart failure: Secondary | ICD-10-CM | POA: Insufficient documentation

## 2022-02-04 DIAGNOSIS — M316 Other giant cell arteritis: Secondary | ICD-10-CM | POA: Diagnosis not present

## 2022-02-04 DIAGNOSIS — I509 Heart failure, unspecified: Secondary | ICD-10-CM | POA: Insufficient documentation

## 2022-02-04 DIAGNOSIS — R519 Headache, unspecified: Secondary | ICD-10-CM

## 2022-02-04 LAB — GLUCOSE, CAPILLARY
Glucose-Capillary: 120 mg/dL — ABNORMAL HIGH (ref 70–99)
Glucose-Capillary: 117 mg/dL — ABNORMAL HIGH (ref 70–99)
Glucose-Capillary: 113 mg/dL — ABNORMAL HIGH (ref 70–99)

## 2022-02-04 SURGERY — BIOPSY TEMPORAL ARTERY
Anesthesia: General | Site: Head | Laterality: Right

## 2022-02-04 MED ORDER — CEFAZOLIN SODIUM-DEXTROSE 2-4 GM/100ML-% IV SOLN
2.0000 g | INTRAVENOUS | Status: AC
  Administered 2022-02-04: 2 g via INTRAVENOUS
  Filled 2022-02-04: qty 100

## 2022-02-04 MED ORDER — FENTANYL CITRATE (PF) 250 MCG/5ML IJ SOLN
INTRAMUSCULAR | Status: DC | PRN
  Administered 2022-02-04 (×3): 50 ug via INTRAVENOUS

## 2022-02-04 MED ORDER — LIDOCAINE 2% (20 MG/ML) 5 ML SYRINGE
INTRAMUSCULAR | Status: DC | PRN
  Administered 2022-02-04: 60 mg via INTRAVENOUS

## 2022-02-04 MED ORDER — LIDOCAINE HCL (PF) 1 % IJ SOLN
INTRAMUSCULAR | Status: AC
  Filled 2022-02-04: qty 5

## 2022-02-04 MED ORDER — FENTANYL CITRATE (PF) 250 MCG/5ML IJ SOLN
INTRAMUSCULAR | Status: AC
  Filled 2022-02-04: qty 5

## 2022-02-04 MED ORDER — PROPOFOL 10 MG/ML IV BOLUS
INTRAVENOUS | Status: DC | PRN
Start: 2022-02-04 — End: 2022-02-04
  Administered 2022-02-04: 140 mg via INTRAVENOUS

## 2022-02-04 MED ORDER — CHLORHEXIDINE GLUCONATE 4 % EX LIQD: 60.0000 mL | Freq: Once | CUTANEOUS | Status: DC

## 2022-02-04 MED ORDER — OXYCODONE-ACETAMINOPHEN 5-325 MG PO TABS: 1.0000 | ORAL_TABLET | ORAL | 0 refills | Status: AC | PRN

## 2022-02-04 MED ORDER — FENTANYL CITRATE (PF) 100 MCG/2ML IJ SOLN
25.0000 ug | INTRAMUSCULAR | Status: DC | PRN
  Administered 2022-02-04 (×2): 25 ug via INTRAVENOUS

## 2022-02-04 MED ORDER — 0.9 % SODIUM CHLORIDE (POUR BTL) OPTIME
TOPICAL | Status: DC | PRN
  Administered 2022-02-04: 1000 mL

## 2022-02-04 MED ORDER — DROPERIDOL 2.5 MG/ML IJ SOLN: 0.6250 mg | Freq: Once | INTRAMUSCULAR | Status: DC | PRN

## 2022-02-04 MED ORDER — FENTANYL CITRATE (PF) 100 MCG/2ML IJ SOLN
INTRAMUSCULAR | Status: DC
Start: 2022-02-04 — End: 2022-02-04
  Filled 2022-02-04: qty 2

## 2022-02-04 MED ORDER — CHLORHEXIDINE GLUCONATE 0.12 % MT SOLN
15.0000 mL | Freq: Once | OROMUCOSAL | Status: AC
  Administered 2022-02-04: 15 mL via OROMUCOSAL
  Filled 2022-02-04: qty 15

## 2022-02-04 MED ORDER — MEPERIDINE HCL 25 MG/ML IJ SOLN: 6.2500 mg | INTRAMUSCULAR | Status: DC | PRN

## 2022-02-04 MED ORDER — ORAL CARE MOUTH RINSE: 15.0000 mL | Freq: Once | OROMUCOSAL | Status: AC

## 2022-02-04 MED ORDER — PHENYLEPHRINE HCL-NACL 20-0.9 MG/250ML-% IV SOLN
INTRAVENOUS | Status: DC | PRN
  Administered 2022-02-04: 50 ug/min via INTRAVENOUS

## 2022-02-04 MED ORDER — LACTATED RINGERS IV SOLN: INTRAVENOUS | Status: DC

## 2022-02-04 MED ORDER — SODIUM CHLORIDE 0.9 % IV SOLN: INTRAVENOUS | Status: DC

## 2022-02-04 MED ORDER — ACETAMINOPHEN 500 MG PO TABS
1000.0000 mg | ORAL_TABLET | Freq: Once | ORAL | Status: AC
  Administered 2022-02-04: 1000 mg via ORAL
  Filled 2022-02-04: qty 2

## 2022-02-04 SURGICAL SUPPLY — 41 items
BAG COUNTER SPONGE SURGICOUNT (BAG) ×2 IMPLANT
CANISTER SUCT 3000ML PPV (MISCELLANEOUS) ×2 IMPLANT
CNTNR URN SCR LID CUP LEK RST (MISCELLANEOUS) ×1 IMPLANT
CONT SPEC 4OZ STRL OR WHT (MISCELLANEOUS) ×1
COTTONBALL LRG STERILE PKG (GAUZE/BANDAGES/DRESSINGS) ×2 IMPLANT
COVER SURGICAL LIGHT HANDLE (MISCELLANEOUS) ×2 IMPLANT
DECANTER SPIKE VIAL GLASS SM (MISCELLANEOUS) ×2 IMPLANT
DERMABOND ADHESIVE PROPEN (GAUZE/BANDAGES/DRESSINGS) ×1
DERMABOND ADVANCED (GAUZE/BANDAGES/DRESSINGS) ×1
DERMABOND ADVANCED .7 DNX12 (GAUZE/BANDAGES/DRESSINGS) ×1 IMPLANT
DERMABOND ADVANCED .7 DNX6 (GAUZE/BANDAGES/DRESSINGS) IMPLANT
DRAPE OPHTHALMIC 77X100 STRL (CUSTOM PROCEDURE TRAY) ×2 IMPLANT
ELECT NDL TIP 2.8 STRL (NEEDLE) ×1 IMPLANT
ELECT NEEDLE TIP 2.8 STRL (NEEDLE) ×2 IMPLANT
ELECT REM PT RETURN 9FT ADLT (ELECTROSURGICAL) ×2
ELECTRODE REM PT RTRN 9FT ADLT (ELECTROSURGICAL) ×1 IMPLANT
GAUZE SPONGE 2X2 8PLY STRL LF (GAUZE/BANDAGES/DRESSINGS) ×1 IMPLANT
GLOVE SRG 8 PF TXTR STRL LF DI (GLOVE) ×1 IMPLANT
GLOVE SURG ENC MOIS LTX SZ7.5 (GLOVE) ×2 IMPLANT
GLOVE SURG POLY ORTHO LF SZ7.5 (GLOVE) IMPLANT
GLOVE SURG UNDER LTX SZ8 (GLOVE) ×2 IMPLANT
GLOVE SURG UNDER POLY LF SZ8 (GLOVE) ×1
GOWN STRL REUS W/ TWL LRG LVL3 (GOWN DISPOSABLE) ×3 IMPLANT
GOWN STRL REUS W/TWL LRG LVL3 (GOWN DISPOSABLE) ×3
KIT BASIN OR (CUSTOM PROCEDURE TRAY) ×2 IMPLANT
KIT TURNOVER KIT B (KITS) ×2 IMPLANT
NDL HYPO 25GX1X1/2 BEV (NEEDLE) ×1 IMPLANT
NEEDLE HYPO 25GX1X1/2 BEV (NEEDLE) ×2 IMPLANT
NS IRRIG 1000ML POUR BTL (IV SOLUTION) ×2 IMPLANT
PACK GENERAL/GYN (CUSTOM PROCEDURE TRAY) ×2 IMPLANT
PAD ARMBOARD 7.5X6 YLW CONV (MISCELLANEOUS) ×4 IMPLANT
SPONGE GAUZE 2X2 STER 10/PKG (GAUZE/BANDAGES/DRESSINGS) ×1
SUT MNCRL AB 4-0 PS2 18 (SUTURE) ×2 IMPLANT
SUT PROLENE 6 0 BV (SUTURE) IMPLANT
SUT SILK 3 0 (SUTURE) ×1
SUT SILK 3-0 18XBRD TIE 12 (SUTURE) ×1 IMPLANT
SUT VIC AB 3-0 SH 27 (SUTURE) ×1
SUT VIC AB 3-0 SH 27X BRD (SUTURE) ×1 IMPLANT
SYR CONTROL 10ML LL (SYRINGE) ×2 IMPLANT
TOWEL GREEN STERILE (TOWEL DISPOSABLE) ×2 IMPLANT
WATER STERILE IRR 1000ML POUR (IV SOLUTION) ×2 IMPLANT

## 2022-02-04 NOTE — Transfer of Care (Signed)
Immediate Anesthesia Transfer of Care Note ? ?Patient: Wanda Hanson ? ?Procedure(s) Performed: RIGHT TEMPORAL ARTERY BIOPSY (Right: Head) ? ?Patient Location: PACU ? ?Anesthesia Type:General ? ?Level of Consciousness: awake, alert  and oriented ? ?Airway & Oxygen Therapy: Patient Spontanous Breathing and Patient connected to nasal cannula oxygen ? ?Post-op Assessment: Report given to RN and Post -op Vital signs reviewed and stable ? ?Post vital signs: Reviewed and stable ? ?Last Vitals:  ?Vitals Value Taken Time  ?BP 155/92 02/04/22 1428  ?Temp    ?Pulse 76 02/04/22 1431  ?Resp 15 02/04/22 1431  ?SpO2 96 % 02/04/22 1431  ?Vitals shown include unvalidated device data. ? ?Last Pain:  ?Vitals:  ? 02/04/22 1121  ?TempSrc:   ?PainSc: 9   ?   ? ?Patients Stated Pain Goal: 2 (02/04/22 1121) ? ?Complications: No notable events documented. ?

## 2022-02-04 NOTE — Interval H&P Note (Signed)
History and Physical Interval Note: ? ?02/04/2022 ?12:16 PM ? ?Wanda Hanson  has presented today for surgery, with the diagnosis of Temporal Arteritis.  The various methods of treatment have been discussed with the patient and family. After consideration of risks, benefits and other options for treatment, the patient has consented to  Procedure(s): ?RIGHT TEMPORAL ARTERY BIOPSY (Right) as a surgical intervention.  The patient's history has been reviewed, patient examined, no change in status, stable for surgery.  I have reviewed the patient's chart and labs.  Questions were answered to the patient's satisfaction.   ? ? ?Deitra Mayo ? ? ?

## 2022-02-04 NOTE — Anesthesia Procedure Notes (Signed)
Procedure Name: LMA Insertion ?Date/Time: 02/04/2022 1:40 PM ?Performed by: Mariea Clonts, CRNA ?Pre-anesthesia Checklist: Patient identified, Emergency Drugs available, Suction available and Patient being monitored ?Patient Re-evaluated:Patient Re-evaluated prior to induction ?Oxygen Delivery Method: Circle System Utilized ?Preoxygenation: Pre-oxygenation with 100% oxygen ?Induction Type: IV induction ?Ventilation: Mask ventilation without difficulty ?LMA: LMA inserted ?LMA Size: 4.0 ?Number of attempts: 1 ?Airway Equipment and Method: Bite block ?Placement Confirmation: positive ETCO2 ?Tube secured with: Tape ?Dental Injury: Teeth and Oropharynx as per pre-operative assessment  ? ? ? ? ?

## 2022-02-04 NOTE — Telephone Encounter (Signed)
ATC patient after getting a refill request. Patient hasnt been seen since 2021 and needs an appt. No VM was available.  ?

## 2022-02-04 NOTE — Anesthesia Postprocedure Evaluation (Signed)
Anesthesia Post Note ? ?Patient: Wanda Hanson ? ?Procedure(s) Performed: RIGHT TEMPORAL ARTERY BIOPSY (Right: Head) ? ?  ? ?Patient location during evaluation: PACU ?Anesthesia Type: General ?Level of consciousness: sedated and patient cooperative ?Pain management: pain level controlled ?Vital Signs Assessment: post-procedure vital signs reviewed and stable ?Respiratory status: spontaneous breathing ?Cardiovascular status: stable ?Anesthetic complications: no ? ? ?No notable events documented. ? ?Last Vitals:  ?Vitals:  ? 02/04/22 1059  ?BP: (!) 177/82  ?Pulse: 89  ?Resp: 18  ?Temp: 37 ?C  ?SpO2: 92%  ?  ?Last Pain:  ?Vitals:  ? 02/04/22 1121  ?TempSrc:   ?PainSc: 9   ? ? ?  ?  ?  ?  ?  ?  ? ?Nolon Nations ? ? ? ? ?

## 2022-02-04 NOTE — Op Note (Signed)
? ? ?  NAME: Wanda Hanson    MRN: [id number] ?DOB: 1946/08/02    DATE OF OPERATION: 02/04/2022 ? ?PREOP DIAGNOSIS:   ? ?Rule out temporal arteritis ? ?POSTOP DIAGNOSIS:   ? ?Same ? ?PROCEDURE:  ?  ?Right temporal artery biopsy ? ?SURGEON: Judeth Cornfield. Scot Dock, MD ? ?ASSIST: None ? ?ANESTHESIA: General ? ?EBL: Minimal ? ?INDICATIONS:  ? ? [name] is a 76 y.o. female who was being worked up for temporal arteritis and we asked to perform a right temporal artery biopsy ? ?FINDINGS:  ? ?3 cm segment of the temporal artery was excised from the right preauricular region ? ?TECHNIQUE:  ? ?The patient was taken to the operating room and received a general anesthetic.  Identified the right temporal artery by palpation and also by duplex.  The right preauricular area was prepped and draped in usual sterile fashion.  A longitudinal incision was made over this area.  The temporal artery was identified and dissected free over a length of approximately 3 cm.  It was ligated proximally and distally.  The specimen was sent to pathology.  Hemostasis was obtained in the wound.  The wound was closed with a deep layer of 3-0 Vicryl and the skin closed with 4-0 Monocryl.  Dermabond was applied.  The patient tolerated the procedure well and was transferred to the recovery room in stable condition.  All needle and sponge counts were correct. ? ?Deitra Mayo, MD, FACS ?[facility] ? ?DATE OF DICTATION:   02/04/2022 ? ?

## 2022-02-05 ENCOUNTER — Encounter (HOSPITAL_COMMUNITY): Payer: Self-pay | Admitting: Vascular Surgery

## 2022-02-06 LAB — SURGICAL PATHOLOGY

## 2022-02-07 ENCOUNTER — Telehealth: Payer: Self-pay

## 2022-02-07 NOTE — Telephone Encounter (Signed)
Patient said Dr. Scot Dock told her to wait until 3 days after surgery to bathe.  She want to know if she can use any type of soap or products.  I advised her to only use mild soap and water at this time.  Patient verbalized understanding and will follow up after biopsy labs come back. ?

## 2022-02-17 ENCOUNTER — Telehealth: Payer: Self-pay | Admitting: Neurology

## 2022-02-17 NOTE — Telephone Encounter (Signed)
Called the patient and husband back. The surgeon did not review the results with her. I advised that Dr Felecia Shelling looked over and the lab showed that it was negative for temporal arterities. Advised that we can bring her in for a follow up visit to address her continued symptoms with Dr Felecia Shelling and see what he recommends. They accepted the Thursday 9:30 am apt with Dr Felecia Shelling for 02/20/2022.  ?

## 2022-02-17 NOTE — Telephone Encounter (Signed)
Pt's husband called stating that the pt had procedure done on 3/14 and they are wanting to know what the next step is due to not hearing from anyone from this office and pt not getting any better. Please advise. ?

## 2022-02-20 ENCOUNTER — Encounter: Payer: Self-pay | Admitting: Neurology

## 2022-02-20 ENCOUNTER — Telehealth: Payer: Self-pay | Admitting: Neurology

## 2022-02-20 ENCOUNTER — Ambulatory Visit: Payer: Medicare Other | Admitting: Neurology

## 2022-02-20 VITALS — BP 143/83 | HR 80 | Ht 60.0 in | Wt 194.0 lb

## 2022-02-20 DIAGNOSIS — M791 Myalgia, unspecified site: Secondary | ICD-10-CM

## 2022-02-20 DIAGNOSIS — R269 Unspecified abnormalities of gait and mobility: Secondary | ICD-10-CM

## 2022-02-20 DIAGNOSIS — R7982 Elevated C-reactive protein (CRP): Secondary | ICD-10-CM | POA: Diagnosis not present

## 2022-02-20 DIAGNOSIS — M542 Cervicalgia: Secondary | ICD-10-CM

## 2022-02-20 DIAGNOSIS — R413 Other amnesia: Secondary | ICD-10-CM | POA: Diagnosis not present

## 2022-02-20 DIAGNOSIS — R441 Visual hallucinations: Secondary | ICD-10-CM

## 2022-02-20 DIAGNOSIS — R440 Auditory hallucinations: Secondary | ICD-10-CM

## 2022-02-20 DIAGNOSIS — R700 Elevated erythrocyte sedimentation rate: Secondary | ICD-10-CM

## 2022-02-20 DIAGNOSIS — R442 Other hallucinations: Secondary | ICD-10-CM

## 2022-02-20 MED ORDER — DONEPEZIL HCL 5 MG PO TABS: 5.0000 mg | ORAL_TABLET | Freq: Every day | ORAL | 0 refills | Status: DC

## 2022-02-20 NOTE — Progress Notes (Signed)
? ?[facility] ? ?PATIENT: Wanda Hanson ?DOB: [birth year] ? ?REFERRING DOCTOR OR PCP: Wanda Seal, MD ?SOURCE: Patient, notes from primary care, imaging and laboratory reports, MRI images personally reviewed. ? ?_________________________________ ? ? ?HISTORICAL ? ?CHIEF COMPLAINT:  ?Chief Complaint  ?Patient presents with  ? Follow-up  ?  Pt with husband, rm 2. Here to follow up with symptoms that are still occurring and post procedure for assess temporal arteritis  ? ? ?HISTORY OF PRESENT ILLNESS:  ?Wanda Hanson is a 76 y.o. woman with visual hallucinations, olfactory hallucinations, memory loss, myalgias and headaches. ? ?UPDATE 02/20/2022: ?Cognitive:   She has not had much issues with memory.  She feels calculations are fine.    She has not noted issues with following a recipe or planning.   She has hallucinations at night more than daytime.   During the day, she will hear a person walking around with clunky shoes and she smells the perfume.   At night she hears and see people who are not there - a man and a woman.    She feels that they are using her computer and searching her room, going through her stuff.   The voices are sometimes making fun of her and laughing.   These symptoms started in late 2022 ? 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  She has a h/o migraine.  She had one sever atypical headach with righ temple pain that worsened when she tried to open her jaw.   She also has aching all over and has had ths x several years     She had elevated ESR and a temporal artery biopsy was performed -- it was normal.    She has noted more left shoulder pain since the surgery.   ? ?She has RLS and s on gabapentin, tramadol and ropinirole for these issues.    She also takes lorazepam at bedtme.     She has not had RE behavir disorder ? ?She has a some depression and is on Wanda Hanson x years.  Wellbutrin was just added.      ? 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    Blood counts  under control x many years.    ? ? ?  02/20/2022  ? 12:56 PM  ?Montreal Cognitive Assessment   ?Visuospatial/ Executive (0/5) 3  ?Naming (0/3) 3  ?Attention: Read list of digits (0/2) 2  ?Attention: Read list of letters (0/1) 1  ?Attention: Serial 7 subtraction starting at 100 (0/3) 0  ?Language: Repeat phrase (0/2) 0  ?Language : Fluency (0/1) 0  ?Abstraction (0/2) 2  ?Delayed Recall (0/5) 4  ?Orientation (0/6) 6  ?Total 21  ?Adjusted Score (based on education) 22  ? ? ? ?DATA: ?IMAGING personally reviewed ?MRI of the brain 12/25/2021 was normal for age --- minimal atrophy and minimal SVID. ? ?LABS ?12/12/2021:  CK slightly elevated at 248 (< 223); ESR elevated at 49 (<30); CRP elevated at 5.5 (<5); BUN/Crt 40 / 2.2; K = 3.4;  CBC ok; Vit D low at 10.   ? ?ESR was 14 07/2021, 42 11/27/2020. 88 10/02/20 ? ?Her daughter has Wanda Hanson disease and MS.   ? ?REVIEW OF SYSTEMS: ?Constitutional: No fevers, chills, sweats, or change in appetite ?Eyes: No visual changes, double vision, eye pain ?Ear, nose and throat: No hearing loss, ear pain, nasal congestion, sore throat ?Cardiovascular: No chest pain, palpitations ?Respiratory:  No shortness of breath at rest or with  exertion.   No wheezes ?GastrointestinaI: No nausea, vomiting, diarrhea, abdominal pain, fecal incontinence ?Genitourinary:  No dysuria, urinary retention or frequency.  No nocturia. ?Musculoskeletal:  No neck pain, back pain ?Integumentary: No rash, pruritus, skin lesions ?Neurological: as above ?Psychiatric: No depression at this time.  No anxiety ?Endocrine: No palpitations, diaphoresis, change in appetite, change in weigh or increased thirst ?Hematologic/Lymphatic:  No anemia, purpura, petechiae. ?Allergic/Immunologic: No itchy/runny eyes, nasal congestion, recent allergic reactions, rashes ? ?ALLERGIES: ?Allergies  ?Allergen Reactions  ? Benzalkonium Chloride Swelling and Dermatitis  ?  At The Site of Application ? ?  ? Cortisone Swelling  ?  Neomycin-Bacitracin-Polymyxin [Bacitracin-Neomycin-Polymyxin] Swelling  ? Pollen Extract Other (See Comments)  ?  Typical "Hay Fever" symptoms ?Stuffy nose  ? Ace Inhibitors Swelling  ? Asa [Aspirin]   ?  Ulcer ?  ? Morphine And Related   ?  SOB ?  ? Neosporin [Bacitracin-Polymyxin B]   ?  Swelling where applied   ? Latex Hives and Rash  ? ? ?HOME MEDICATIONS: ? ?Current Outpatient Medications:  ?  ACCU-CHEK GUIDE test strip, , Disp: , Rfl:  ?  Acetaminophen 500 MG capsule, Take 1,000 mg by mouth every 6 (six) hours as needed for mild pain or headache., Disp: , Rfl:  ?  albuterol (VENTOLIN HFA) 108 (90 Base) MCG/ACT inhaler, Inhale 2 puffs into the lungs every 6 (six) hours as needed for wheezing or shortness of breath., Disp: 8 g, Rfl: 6 ?  buPROPion (WELLBUTRIN XL) 150 MG 24 hr tablet, Take 150 mg by mouth every morning., Disp: , Rfl:  ?  Cholecalciferol (VITAMIN D3 PO), Take 1 tablet by mouth daily., Disp: , Rfl:  ?  diclofenac Sodium (VOLTAREN) 1 % GEL, Apply 2 g topically 4 (four) times daily as needed (right toe pain)., Disp: , Rfl:  ?  donepezil (ARICEPT) 5 MG tablet, Take 1 tablet (5 mg total) by mouth at bedtime., Disp: 30 tablet, Rfl: 0 ?  DULoxetine (Wanda Hanson) 60 MG capsule, Take 60 mg by mouth daily., Disp: , Rfl:  ?  erythromycin ophthalmic ointment, Place into both eyes nightly. And as needed during the day for irritation., Disp: , Rfl:  ?  Fluocinolone Acetonide 0.01 % OIL, Place 1 application. in ear(s) daily as needed for itching., Disp: , Rfl:  ?  Fluocinolone Acetonide Body 0.01 % OIL, Apply 1 application. topically daily as needed (scalp)., Disp: , Rfl:  ?  Fluocinolone Acetonide Scalp 0.01 % OIL, Apply 1 application. topically daily as needed (break out scalp)., Disp: , Rfl:  ?  fluocinonide (LIDEX) 0.05 % external solution, Apply 1 application. topically 2 (two) times daily., Disp: , Rfl:  ?  furosemide (LASIX) 40 MG tablet, Take 20 mg by mouth 2 (two) times daily., Disp: , Rfl:  ?  gabapentin  (NEURONTIN) 800 MG tablet, Take 1,600 mg by mouth 2 (two) times daily. , Disp: , Rfl:  ?  JARDIANCE 10 MG TABS tablet, Take 10 mg by mouth daily., Disp: , Rfl:  ?  LORazepam (ATIVAN) 0.5 MG tablet, Take 1 mg by mouth at bedtime., Disp: , Rfl:  ?  losartan (COZAAR) 50 MG tablet, Take 1 tablet (50 mg total) by mouth daily., Disp: 90 tablet, Rfl: 3 ?  meclizine (ANTIVERT) 12.5 MG tablet, Take 12.5 mg by mouth 3 (three) times daily as needed for dizziness., Disp: , Rfl:  ?  metolazone (ZAROXOLYN) 2.5 MG tablet, Take 2.5 mg by mouth every Monday., Disp: , Rfl:  ?  nebivolol (BYSTOLIC) 5 MG tablet, Take 5 mg by mouth daily., Disp: , Rfl:  ?  ondansetron (ZOFRAN) 4 MG tablet, Take 4 mg by mouth every 8 (eight) hours as needed for nausea., Disp: , Rfl:  ?  oxyCODONE-acetaminophen (PERCOCET) 5-325 MG tablet, Take 1 tablet by mouth every 4 (four) hours as needed for severe pain., Disp: 20 tablet, Rfl: 0 ?  potassium chloride (MICRO-K) 10 MEQ CR capsule, Take 20 mEq by mouth 2 (two) times daily., Disp: , Rfl:  ?  rOPINIRole (REQUIP) 1 MG tablet, Take 3 mg by mouth at bedtime. , Disp: , Rfl:  ?  SUMAtriptan (IMITREX) 50 MG tablet, Take 50 mg by mouth daily as needed for migraine., Disp: , Rfl:  ?  SYMBICORT 160-4.5 MCG/ACT inhaler, INHALE TWO PUFFS BY MOUTH TWICE A DAY, Disp: 10.2 g, Rfl: 5 ?  tiZANidine (ZANAFLEX) 4 MG tablet, Take 4 mg by mouth at bedtime., Disp: , Rfl:  ? ?PAST MEDICAL HISTORY: ?Past Medical History:  ?Diagnosis Date  ? Arthritis   ? CHF (congestive heart failure) ([place])   ? Chronic back pain   ? CML (chronic myelocytic leukemia) ([place]) 2015  ? Depression   ? Diabetes mellitus ([place])   ? Eczema   ? Fatty liver   ? GERD (gastroesophageal reflux disease)   ? Gout   ? Hyperlipidemia   ? Hypertension   ? Migraine   ? OSA (obstructive sleep apnea)   ? Peripheral neuropathy   ? Restless leg syndrome   ? Rosacea   ? Seborrheic dermatitis   ? ? ?PAST SURGICAL HISTORY: ?Past Surgical History:  ?Procedure Laterality Date   ? ABDOMINAL HYSTERECTOMY    ? ARTERY BIOPSY Right 02/04/2022  ? Procedure: RIGHT TEMPORAL ARTERY BIOPSY;  Surgeon: Angelia Mould, MD;  Location: [facility];  Service: Vascular;  Laterality: Right;  ? BACK SURGERY    ?

## 2022-02-20 NOTE — Telephone Encounter (Signed)
[employer] medicare order sent to [facility], [facility] they will reach out to the patient to schedule.  

## 2022-02-24 ENCOUNTER — Ambulatory Visit: Payer: Medicare Other | Admitting: Neurology

## 2022-02-24 DIAGNOSIS — R441 Visual hallucinations: Secondary | ICD-10-CM

## 2022-02-24 DIAGNOSIS — R442 Other hallucinations: Secondary | ICD-10-CM

## 2022-02-24 DIAGNOSIS — R410 Disorientation, unspecified: Secondary | ICD-10-CM

## 2022-02-24 DIAGNOSIS — R440 Auditory hallucinations: Secondary | ICD-10-CM

## 2022-02-24 NOTE — Progress Notes (Signed)
? ?  [facility] ? ?EEG (ELECTROENCEPHALOGRAM) REPORT ? ? ?STUDY DATE: 02/24/2022 ?PATIENT NAME: Wanda Hanson ?DOB: Jul 01, 1946 ?MRN: [id number] ? ?ORDERING CLINICIAN: Herman Fiero A. Meilyn Heindl, MD. PhD ? ?TECHNIQUE: Electroencephalogram was recorded utilizing standard 10-20 system of lead placement and reformatted into average and bipolar montages.  ?RECORDING TIME: 23 minutes 41 seconds ? ?CLINICAL INFORMATION: 76 year old woman with memory loss and hallucinations ? ?FINDINGS: A digital EEG was performed while the patient was awake and drowsy. While awake and most alert there was a 10 hz posterior dominant rhythm. Voltages and frequencies were symmetric.  There were no focal, lateralizing, epileptiform activity or seizures seen.  Photic stimulation had a normal driving response. Hyperventilation and recovery did not change the underlying rhythms. EKG channel shows normal sinus rhythm.  The patient remained awake. ? ?IMPRESSION: This is a normal EEG with the patient was awake. ? ? ?INTERPRETING PHYSICIAN:  ? ?Ixel Boehning A. Felecia Shelling, MD, PhD, Charlynn Grimes ?Certified in Neurology, Clinical Neurophysiology, Sleep Medicine, Pain Medicine and Neuroimaging ? ?[facility] ?[address], [address] ?[place], [place] [address] ?[phone] ? ? ? ?

## 2022-02-25 ENCOUNTER — Encounter: Payer: Self-pay | Admitting: *Deleted

## 2022-02-26 NOTE — Telephone Encounter (Signed)
Called and spoke w/ pt husband about normal EEG results. He verbalized understanding.  ? ?She has MRI scheduled for 03/10/22. Aware we will call about results once back. ? ?

## 2022-03-10 ENCOUNTER — Other Ambulatory Visit: Payer: Medicare Other

## 2022-03-15 ENCOUNTER — Other Ambulatory Visit: Payer: Medicare Other

## 2022-03-18 ENCOUNTER — Other Ambulatory Visit: Payer: Self-pay | Admitting: Neurology

## 2022-03-25 ENCOUNTER — Telehealth: Payer: Self-pay | Admitting: Neurology

## 2022-03-25 MED ORDER — DONEPEZIL HCL 10 MG PO TABS: 10.0000 mg | ORAL_TABLET | Freq: Every day | ORAL | 5 refills | Status: AC

## 2022-03-25 NOTE — Telephone Encounter (Signed)
Pt states her last time in the office she was told to call [place] office if tolerating '5mg'$  donepezil (ARICEPT) 5 MG tablet well, to up dosage.  ?Pt calling to inform office she is tolerating well.  ?

## 2022-03-25 NOTE — Telephone Encounter (Signed)
Called the patient back. Advised the patient that if she is tolerated well she can go up to the 10 mg strength at bedtime. Pt has done well with the 5 mg strength. A refill was just sent to the pharmacy on 03/18/22. Pt states she did pick this up. Advised I will forward a updated script and in the meantime she can start by taking 2 tablets at bedtime. Advised if she develops side effects or doesn't tolerate that increase let us know. Pt verbalized understanding. ? ?

## 2022-09-19 ENCOUNTER — Other Ambulatory Visit: Payer: Self-pay | Admitting: Neurology
